# Patient Record
Sex: Female | Born: 1940
Health system: Southern US, Community
[De-identification: ages and names within clinical notes are randomized; demographics above are authoritative.]

## PROBLEM LIST (undated history)

## (undated) DIAGNOSIS — I10 Essential (primary) hypertension: Secondary | ICD-10-CM

## (undated) DIAGNOSIS — C541 Malignant neoplasm of endometrium: Secondary | ICD-10-CM

## (undated) DIAGNOSIS — R079 Chest pain, unspecified: Secondary | ICD-10-CM

## (undated) DIAGNOSIS — K219 Gastro-esophageal reflux disease without esophagitis: Secondary | ICD-10-CM

## (undated) DIAGNOSIS — E785 Hyperlipidemia, unspecified: Secondary | ICD-10-CM

## (undated) DIAGNOSIS — I739 Peripheral vascular disease, unspecified: Secondary | ICD-10-CM

## (undated) HISTORY — PX: CARPAL TUNNEL RELEASE: SHX101

## (undated) HISTORY — DX: Chest pain, unspecified: R07.9

## (undated) HISTORY — DX: Hyperlipidemia, unspecified: E78.5

## (undated) HISTORY — DX: Gastro-esophageal reflux disease without esophagitis: K21.9

## (undated) HISTORY — DX: Essential (primary) hypertension: I10

## (undated) HISTORY — DX: Malignant neoplasm of endometrium: C54.1

## (undated) HISTORY — DX: Peripheral vascular disease, unspecified: I73.9

## (undated) HISTORY — PX: BLADDER SUSPENSION: SHX72

---

## 1997-11-20 ENCOUNTER — Ambulatory Visit (HOSPITAL_COMMUNITY): Admission: RE | Admit: 1997-11-20 | Discharge: 1997-11-20 | Payer: Self-pay | Admitting: Family Medicine

## 1998-02-18 ENCOUNTER — Ambulatory Visit (HOSPITAL_COMMUNITY): Admission: RE | Admit: 1998-02-18 | Discharge: 1998-02-18 | Payer: Self-pay | Admitting: Obstetrics and Gynecology

## 1998-11-26 ENCOUNTER — Ambulatory Visit (HOSPITAL_COMMUNITY): Admission: RE | Admit: 1998-11-26 | Discharge: 1998-11-26 | Payer: Self-pay | Admitting: Specialist

## 1999-03-11 ENCOUNTER — Encounter: Payer: Self-pay | Admitting: Obstetrics and Gynecology

## 1999-03-11 ENCOUNTER — Ambulatory Visit (HOSPITAL_COMMUNITY): Admission: RE | Admit: 1999-03-11 | Discharge: 1999-03-11 | Payer: Self-pay | Admitting: Obstetrics and Gynecology

## 2000-10-13 ENCOUNTER — Ambulatory Visit (HOSPITAL_COMMUNITY): Admission: RE | Admit: 2000-10-13 | Discharge: 2000-10-13 | Payer: Self-pay | Admitting: Obstetrics and Gynecology

## 2000-10-13 ENCOUNTER — Encounter: Payer: Self-pay | Admitting: Obstetrics and Gynecology

## 2006-11-01 DIAGNOSIS — E8941 Symptomatic postprocedural ovarian failure: Secondary | ICD-10-CM | POA: Insufficient documentation

## 2006-11-11 ENCOUNTER — Encounter (INDEPENDENT_AMBULATORY_CARE_PROVIDER_SITE_OTHER): Payer: Self-pay | Admitting: Specialist

## 2006-11-11 ENCOUNTER — Ambulatory Visit (HOSPITAL_COMMUNITY): Admission: RE | Admit: 2006-11-11 | Discharge: 2006-11-11 | Payer: Self-pay | Admitting: Obstetrics and Gynecology

## 2006-12-01 HISTORY — PX: LAPAROSCOPIC ASSISTED VAGINAL HYSTERECTOMY: SHX5398

## 2006-12-29 ENCOUNTER — Encounter (INDEPENDENT_AMBULATORY_CARE_PROVIDER_SITE_OTHER): Payer: Self-pay | Admitting: Obstetrics and Gynecology

## 2006-12-29 ENCOUNTER — Inpatient Hospital Stay (HOSPITAL_COMMUNITY): Admission: RE | Admit: 2006-12-29 | Discharge: 2007-01-01 | Payer: Self-pay | Admitting: Obstetrics and Gynecology

## 2007-01-24 ENCOUNTER — Ambulatory Visit: Admission: RE | Admit: 2007-01-24 | Discharge: 2007-01-24 | Payer: Self-pay | Admitting: Gynecologic Oncology

## 2007-03-03 DIAGNOSIS — K224 Dyskinesia of esophagus: Secondary | ICD-10-CM | POA: Insufficient documentation

## 2007-04-04 ENCOUNTER — Ambulatory Visit: Admission: RE | Admit: 2007-04-04 | Discharge: 2007-04-04 | Payer: Self-pay | Admitting: Gynecologic Oncology

## 2007-04-04 ENCOUNTER — Other Ambulatory Visit: Admission: RE | Admit: 2007-04-04 | Discharge: 2007-04-04 | Payer: Self-pay | Admitting: Gynecologic Oncology

## 2007-04-04 ENCOUNTER — Encounter (INDEPENDENT_AMBULATORY_CARE_PROVIDER_SITE_OTHER): Payer: Self-pay | Admitting: Gynecologic Oncology

## 2007-05-05 ENCOUNTER — Ambulatory Visit: Payer: Self-pay | Admitting: Internal Medicine

## 2007-05-08 ENCOUNTER — Ambulatory Visit: Payer: Self-pay | Admitting: Internal Medicine

## 2007-06-19 ENCOUNTER — Ambulatory Visit: Payer: Self-pay | Admitting: Internal Medicine

## 2007-09-15 DIAGNOSIS — I10 Essential (primary) hypertension: Secondary | ICD-10-CM | POA: Insufficient documentation

## 2007-09-15 DIAGNOSIS — Z9889 Other specified postprocedural states: Secondary | ICD-10-CM | POA: Insufficient documentation

## 2007-09-15 DIAGNOSIS — Z9089 Acquired absence of other organs: Secondary | ICD-10-CM | POA: Insufficient documentation

## 2007-10-10 ENCOUNTER — Encounter: Payer: Self-pay | Admitting: Gynecology

## 2007-10-10 ENCOUNTER — Ambulatory Visit: Admission: RE | Admit: 2007-10-10 | Discharge: 2007-10-10 | Payer: Self-pay | Admitting: Gynecologic Oncology

## 2007-10-10 ENCOUNTER — Other Ambulatory Visit: Admission: RE | Admit: 2007-10-10 | Discharge: 2007-10-10 | Payer: Self-pay | Admitting: Gynecologic Oncology

## 2008-06-25 ENCOUNTER — Ambulatory Visit (HOSPITAL_COMMUNITY): Admission: RE | Admit: 2008-06-25 | Discharge: 2008-06-26 | Payer: Self-pay | Admitting: Obstetrics and Gynecology

## 2008-10-11 ENCOUNTER — Encounter (INDEPENDENT_AMBULATORY_CARE_PROVIDER_SITE_OTHER): Payer: Self-pay | Admitting: Urology

## 2008-10-11 ENCOUNTER — Ambulatory Visit (HOSPITAL_BASED_OUTPATIENT_CLINIC_OR_DEPARTMENT_OTHER): Admission: RE | Admit: 2008-10-11 | Discharge: 2008-10-11 | Payer: Self-pay | Admitting: Urology

## 2008-10-30 ENCOUNTER — Other Ambulatory Visit: Admission: RE | Admit: 2008-10-30 | Discharge: 2008-10-30 | Payer: Self-pay | Admitting: Gynecologic Oncology

## 2008-10-30 ENCOUNTER — Encounter (INDEPENDENT_AMBULATORY_CARE_PROVIDER_SITE_OTHER): Payer: Self-pay | Admitting: Gynecologic Oncology

## 2008-10-30 ENCOUNTER — Ambulatory Visit: Admission: RE | Admit: 2008-10-30 | Discharge: 2008-10-30 | Payer: Self-pay | Admitting: Gynecologic Oncology

## 2009-11-06 ENCOUNTER — Other Ambulatory Visit: Admission: RE | Admit: 2009-11-06 | Discharge: 2009-11-06 | Payer: Self-pay | Admitting: Gynecologic Oncology

## 2009-11-06 ENCOUNTER — Ambulatory Visit: Admission: RE | Admit: 2009-11-06 | Discharge: 2009-11-06 | Payer: Self-pay | Admitting: Gynecologic Oncology

## 2010-11-12 ENCOUNTER — Other Ambulatory Visit: Payer: Self-pay | Admitting: Gynecologic Oncology

## 2010-11-12 ENCOUNTER — Ambulatory Visit: Payer: Medicare Other | Attending: Gynecologic Oncology | Admitting: Gynecologic Oncology

## 2010-11-12 ENCOUNTER — Other Ambulatory Visit (HOSPITAL_COMMUNITY)
Admission: RE | Admit: 2010-11-12 | Discharge: 2010-11-12 | Disposition: A | Discharge status: Home / Self Care | Payer: Medicare Other | Source: Ambulatory Visit | Attending: Gynecologic Oncology | Admitting: Gynecologic Oncology

## 2010-11-12 DIAGNOSIS — K219 Gastro-esophageal reflux disease without esophagitis: Secondary | ICD-10-CM | POA: Insufficient documentation

## 2010-11-12 DIAGNOSIS — Z8540 Personal history of malignant neoplasm of unspecified female genital organ: Secondary | ICD-10-CM | POA: Insufficient documentation

## 2010-11-12 DIAGNOSIS — Z90710 Acquired absence of both cervix and uterus: Secondary | ICD-10-CM | POA: Insufficient documentation

## 2010-11-12 DIAGNOSIS — Z9079 Acquired absence of other genital organ(s): Secondary | ICD-10-CM | POA: Insufficient documentation

## 2010-11-12 DIAGNOSIS — I10 Essential (primary) hypertension: Secondary | ICD-10-CM | POA: Insufficient documentation

## 2010-11-12 DIAGNOSIS — C549 Malignant neoplasm of corpus uteri, unspecified: Secondary | ICD-10-CM | POA: Insufficient documentation

## 2010-11-12 LAB — POCT I-STAT 4, (NA,K, GLUC, HGB,HCT)
Glucose, Bld: 104 mg/dL — ABNORMAL HIGH (ref 70–99)
HCT: 42 % (ref 36.0–46.0)
Hemoglobin: 14.3 g/dL (ref 12.0–15.0)
Potassium: 4 mEq/L (ref 3.5–5.1)
Sodium: 138 mEq/L (ref 135–145)

## 2010-11-16 NOTE — Consult Note (Signed)
  NAMEREBBIE, LAURICELLA                 ACCOUNT NO.:  [id number]  MEDICAL RECORD NO.:  [id number]            PATIENT TYPE:  LOCATION:                                 FACILITY:  PHYSICIAN:  Laurette Schimke, MD     DATE OF BIRTH:  1941/06/09  DATE OF CONSULTATION:  11/12/2010 DATE OF DISCHARGE:                                CONSULTATION   REASON FOR VISIT:  Endometrial cancer surveillance.  HISTORY OF PRESENT ILLNESS:  This is a 70 year old who was evaluated by Dr. Tenny Craw and noted to have a grade 1 endometrial adenocarcinoma.  In May 2008 she underwent a laparoscopic-assisted vaginal hysterectomy, bilateral salpingo-oophorectomy and anterior colporrhaphy.  Her final pathology was notable for a grade 1 endometrioid adenocarcinoma with superficial myometrial invasion without lymphovascular space invasion. In 2009, she underwent a sling for urinary incontinence, which is markedly improved.  PAST MEDICAL HISTORY:  Hypertension, gastroesophageal reflux disease, stage I endometrial adenocarcinoma.  PAST SURGICAL HISTORY: 1. Prior carpal tunnel release. 2. Laparoscopic-assisted vaginal hysterectomy, bilateral salpingo-     oophorectomy with anterior colporrhaphy. 3. Sling for urinary incontinence.  FAMILY HISTORY:  No interval changes.  REVIEW OF SYSTEMS:  Notable for vulvar discomfort over the past few months managed by Dr. Tenny Craw with clobetasol.  She reports masses on the labia majora.  There is no cough, headache, abdominal pain, weight loss. She denies hematuria, hematochezia, or vaginal bleeding.  There is no unusual edema of the lower extremities.  Otherwise 10-point review of systems is noncontributory.  PHYSICAL EXAMINATION:  GENERAL:  A well-developed female in no acute distress. VITAL SIGNS:  Weight 211 pounds, blood pressure 142/78. CHEST:  Clear to auscultation. HEART:  Regular rate and rhythm. BACK:  No CVA tenderness. LYMPH NODE SURVEY:  No cervical, supraclavicular or  inguinal adenopathy. ABDOMEN:  Soft, obese.  Laparoscopic port sites without evidence of hernia or tenderness. PELVIC EXAMINATION:  External genitalia notable for sebaceous cysts, 2 on the right labia majora and 3 on the contralateral side.  Sebum was expressed from one of the right labial lesions.  This skin is erythematous.  No plaques are appreciated.  There are no unusual pigmentary changes.  Vagina is atrophic without any lesions or masses. RECTAL EXAMINATION:  Good anal sphincter tone without any cul-de-sac masses or rectovaginal septum nodularity. EXTREMITIES:  1+ edema in the lower extremities bilaterally.  IMPRESSION:  Ms. Lazard remains without any evidence of disease from her stage IA endometrial adenocarcinoma.  RECOMMENDATIONS:  A Pap test was collected today.  She has been advised to follow up with Dr. Tenny Craw in 6 months and follow up with Gynecology/Oncology in 12 months.  She has been advised that she can use a warm cloth at the area of the sebaceous cysts and express the sebum with clean fingers.     Laurette Schimke, MD     WB/MEDQ  D:  11/12/2010  T:  11/12/2010  Job:  [id number]  cc:   Miguel Aschoff, M.D.  Telford Nab, R.N. [address] [place], [place] [address]  Electronically Signed by Laurette Schimke MD on 11/16/2010 11:27:52 AM

## 2010-12-15 NOTE — Consult Note (Signed)
Donna Sloan, Donna Sloan                 ACCOUNT NO.:  [id number]   MEDICAL RECORD NO.:  [id number]          PATIENT TYPE:  OUT   LOCATION:  GYN                          FACILITY:  [facility]   PHYSICIAN:  John T. Kyla Balzarine, M.D.    DATE OF BIRTH:  1941-03-05   DATE OF CONSULTATION:  DATE OF DISCHARGE:                                 CONSULTATION   CHIEF COMPLAINT:  Follow-up of endometrial cancer.   HISTORY OF PRESENT ILLNESS:  This patient had complex hyperplasia with  atypia on a D and C performed for menopausal bleeding.  In  May 2008 she  underwent laparoscopic-assisted vaginal hysterectomy with BSO and  anterior colporrhaphy.  Final pathology revealed a grade 1 endometrioid  adenocarcinoma with superficial myometrial invasion (2009 stage IA,  grade 1 lesion), there was no LVSI and postoperative CT scan was  negative.  She has been followed without evidence of recurrent disease  with last follow-up examination and cytology negative.  In November she  underwent sling procedure for urinary incontinence which has markedly  improved incontinence.  She denies pelvic pain, vaginal bleeding, change  in bowel or bladder function or leg swelling.   PAST MEDICAL HISTORY:  1. Hypertension.  2. GERD.  3. Prior carpal tunnel release.  4. LAVH/BSO.  5. Sling procedure.  6. NSVD x2.   CURRENT MEDICATIONS:  Antihypertensive and Nexium.   ALLERGIES:  PENICILLIN.   FAMILY HISTORY:  Mother with basal cell cancers but no gynecologic or  breast malignancies.   PERSONAL AND SOCIAL HISTORY:  Married and denies tobacco or ethanol.   REVIEW OF SYSTEMS:  A 10-point comprehensive review negative except as  noted above.   EXAM:  Weight 204 pounds, blood pressure 130/78.  The patient is alert  and oriented x3, in no acute distress.  LYMPH:  Survey negative for pathologic lymphadenopathy.  BACK:  No spinous or CVA tenderness.  ABDOMEN:  Soft and benign with no hernia, ascites, tenderness or mass.  EXTREMITIES:  Full strength and range of motion without edema, cords or  Homan's.  PELVIC:  External genitalia and BUS, bladder and urethra normal.  The  vagina is well supported with no granulation tissue or other vaginal  lesions.  Bimanual and rectovaginal examinations disclose absent uterus  and cervix, no mass or nodularity and no tenderness.   ASSESSMENT:  Endometrial carcinoma, no evidence of disease.   PLAN:  Cytology obtained and we can continue to alternate follow-up with  Dr. Tenny Craw at 79-month intervals, such that we would see her back for  follow-up in 1 year.      John T. Kyla Balzarine, M.D.  Electronically Signed     JTS/MEDQ  D:  10/30/2008  T:  10/30/2008  Job:  [id number]   cc:   Miguel Aschoff, M.D.  Fax: [phone]   Telford Nab, R.N.  [address]  [place], [place] [address]

## 2010-12-15 NOTE — Consult Note (Signed)
Donna Sloan, Donna Sloan                 ACCOUNT NO.:  [id number]   MEDICAL RECORD NO.:  [id number]          PATIENT TYPE:  OUT   LOCATION:  GYN                          FACILITY:  [facility]   PHYSICIAN:  De Blanch, M.D.DATE OF BIRTH:  May 01, 1941   DATE OF CONSULTATION:  10/10/2007  DATE OF DISCHARGE:                                 CONSULTATION   CHIEF COMPLAINT:  Endometrial cancer.   INTERVAL HISTORY:  Since her last visit, the patient has had no interval  problems.  Overall she is doing quite well.  She denies any GI or GU  symptoms, has no pelvic pain, pressure, vaginal bleeding or discharge.  Her functional status is excellent.  She spent a good bit of the winter  in [place] and in [place].   HISTORY OF PRESENT ILLNESS:  Laparoscopically assisted vaginal surgery,  bilateral salpingo-oophorectomy and anterior colporrhaphy in May 2008.  Final pathology showed a grade 1 endometrial carcinoma with complex  hyperplasia and minimal superficial invasion of the myometrium (4 mm).   PAST MEDICAL HISTORY:  Medical illnesses:  Hypertension,  gastroesophageal reflux disease.   PAST SURGICAL HISTORY:  Carpal tunnel release, D&C, LAVH-BSO.   OBSTETRICAL HISTORY:  Gravida 2.   CURRENT MEDICATIONS:  Antihypertensive and Nexium.   DRUG ALLERGIES:  PENICILLIN.   FAMILY HISTORY:  Mother with basal cell cancers.   PERSONAL AND SOCIAL HISTORY:  The patient is married.  She does not  smoke.   REVIEW OF SYSTEMS:  Ten-point comprehensive review of systems negative  except as noted above.   PHYSICAL EXAMINATION:  VITAL SIGNS:  Weight 196 pounds, blood pressure  130/70, pulse 80, respiratory rate 20.  GENERAL:  The patient is a healthy white female in no acute distress.  HEENT:  Negative.  NECK:  Supple without thyromegaly.  There is no supraclavicular or  inguinal adenopathy.  ABDOMEN:  Soft, nontender.  No mass, organomegaly, ascites or hernias  are noted.  PELVIC:  EG, BUS,  vagina, and urethra are normal.  Cervix and uterus are  surgically absent.  Vaginal cuff is well-healed, well-supported.  No  lesions are noted.  Bimanual and rectovaginal exam reveal no masses,  induration or nodularity.  EXTREMITIES:  Lower extremities without edema or varicosities.   IMPRESSION:  Stage Ib grade 1 endometrial carcinoma.   The patient will return to see Dr. Miguel Aschoff in 6 months or return to  see Dr. Kyla Balzarine in 1 year.  Pap smear is obtained today.      De Blanch, M.D.  Electronically Signed     DC/MEDQ  D:  10/10/2007  T:  10/11/2007  Job:  [id number]   cc:   Telford Nab, R.N.  [address]  [place], [place] [address]   Miguel Aschoff, M.D.  Fax: [phone]

## 2010-12-15 NOTE — Assessment & Plan Note (Signed)
[facility]                         GASTROENTEROLOGY OFFICE NOTE   NAME:Donna Sloan, Donna Sloan                      MRN:          [id number]  DATE:06/19/2007                            DOB:          [birth year]    Donna Sloan is a very nice, 70 year old, white female who was evaluated  for dysphagia by upper endoscopy on May 08, 2007 and was found to  have an essentially normal exam. We passed a large Maloney dilator  through her esophagus and she has had complete relief of the dysphagia  and she has not had any complaint since the dilatation. I suspect that  she might have had mild esophageal stricture which was not  endoscopically noticeable. She denies any symptoms of gastroesophageal  reflux and really has not had to take her Pepcid at all.   The patient also had a screening colonoscopy at the same time which  showed a normal exam from the rectum to the cecum, there were no polyps  and her recall interval is 10 years. The patient is currently satisfied  with her condition. We will see her on a p.r.n. basis only.     Hedwig Morton. Juanda Chance, MD  Electronically Signed    DMB/MedQ  DD: 06/19/2007  DT: 06/19/2007  Job #: [id number]   cc:   Rema Fendt

## 2010-12-15 NOTE — Consult Note (Signed)
NAMEARZU, Donna Sloan                 ACCOUNT NO.:  [id number]   MEDICAL RECORD NO.:  [id number]          PATIENT TYPE:  OUT   LOCATION:  GYN                          FACILITY:  [facility]   PHYSICIAN:  John T. Kyla Balzarine, M.D.    DATE OF BIRTH:  1941/02/02   DATE OF CONSULTATION:  04/04/2007  DATE OF DISCHARGE:                                 CONSULTATION   CHIEF COMPLAINT:  Follow up of endometrial carcinoma.   HISTORY OF PRESENT ILLNESS:  The patient had complex hyperplasia with  atypia on a D&C with cervical LEEP biopsy.  On May 29, she underwent  laparoscopic-assisted vaginal hysterectomy with BSO and anterior  colporrhaphy.  Final pathology revealed a grade 1 endometrioid  adenocarcinoma associated with complex atypical hyperplasia.  There was  focal superficial myometrial invasion (0.4 out of 1.2 cm).  There is no  LVSI with negative cervix and adnexa.  Postoperative CT scan was  negative.   PAST MEDICAL HISTORY:  1. Hypertension.  2. GERD.  3. NSVD x2.  4. Carpal tunnel release.  5. D&C.  6. LAVH/BSO.   MEDICATIONS:  Unknown antihypertensive and Nexium.   ALLERGIES:  PENICILLIN.   FAMILY HISTORY:  The patient's mother had multiple basal cell cancers  but no known family history of breast or gynecologic malignancy.   PERSONAL AND SOCIAL HISTORY:  Married and denies tobacco or ethanol use.   REVIEW OF SYSTEMS:  Negative 10-point review other than above.  The  patient has normal bowel and bladder functions.  She denies pelvic pain,  vaginal bleeding or leg swelling.   PHYSICAL EXAMINATION:  GENERAL:  Weight 190 pounds.  The patient is  anxious, alert and oriented x3, in no acute distress.  LYMPH SURVEY:  No pathologic lymphadenopathy.  BACK:  No spinous or CVA tenderness.  ABDOMEN:  Soft and benign with well-healed trocar site incisions.  No  inflammation, hernia, mass, or ascites.  No abdominal tenderness.  EXTREMITIES:  Full strength and range of motion without edema.  PELVIC:  External genitalia and BUS normal to inspection and palpation.  The bladder and urethra are negative and the vagina is clear and  somewhat atrophic.  Bimanual and rectovaginal examinations reveal absent  uterus and cervix with no mass or nodularity and no tenderness.   ASSESSMENT:  Endometrial cancer, NAD.   PLAN:  The patient will see Dr. Tenny Craw for follow-up in 3 months and  return to see [place] in 6 months.  Cytology is obtained today and will be  communicated to the patient.      John T. Kyla Balzarine, M.D.  Electronically Signed     JTS/MEDQ  D:  04/04/2007  T:  04/05/2007  Job:  [id number]   cc:   Miguel Aschoff, M.D.  Fax: [phone]   Telford Nab, R.N.  [address]  [place], [place] [address]

## 2010-12-15 NOTE — Op Note (Signed)
Donna Sloan, Donna Sloan                 ACCOUNT NO.:  [id number]   MEDICAL RECORD NO.:  [id number]          PATIENT TYPE:  OIB   LOCATION:  9310                          FACILITY:  [facility]   PHYSICIAN:  Randye Lobo, M.D.   DATE OF BIRTH:  1940-11-19   DATE OF PROCEDURE:  06/25/2008  DATE OF DISCHARGE:                               OPERATIVE REPORT   PREOPERATIVE DIAGNOSIS:  Genuine stress incontinence.   POSTOPERATIVE DIAGNOSIS:  Genuine stress incontinence.   PROCEDURES:  Lynx midurethral sling, cystoscopy, and anterior  colporrhaphy.   SURGEON:  Randye Lobo, MD.   ASSISTANT:  Miguel Aschoff, MD.   ANESTHESIA:  General, local with 0.5% lidocaine with epinephrine  1:200,000.   IV FLUIDS:  1300 mL of Ringer lactate.   ESTIMATED BLOOD LOSS:  Minimal.   URINE OUTPUT:  Quantity sufficient.   COMPLICATIONS:  None.   INDICATIONS FOR THE PROCEDURE:  The patient is a 70 year old gravida 2,  para 2 Caucasian female, status post laparoscopically-assisted vaginal  hysterectomy with bilateral salpingo-oophorectomy and anterior  colporrhaphy in Dec 29, 2006, with the final pathology report of  adenocarcinoma of the endometrium, who presented reporting urinary  incontinence with sneezing, coughing, and laughing.  The patient has not  had any radiation therapy for her endometrial cancer.  The patient did  undergo multichannel urodynamic testing in the office on May 06, 2008, which documented genuine stress incontinence with a leak point  pressure of 164 cm of water.  The patient has been seen by her  oncologist, Dr. Ronita Hipps, and her primary gynecologist, Dr. Miguel Aschoff, and has had regular checkups and repeat Pap smears and she is  disease free.  The patient therefore chooses to proceed with surgical  treatment of her urinary incontinence and a plan is made to proceed with  a midurethral sling and cystoscopy after risks, benefits, and  alternatives are reviewed.   FINDINGS:   Examination under anesthesia actually revealed a first-degree  cystocele.  The vaginal apex was well supported.  The cervix was absent.   Cystoscopy demonstrated the ureters to be patent bilaterally after the  injection of indigo carmine dye IV.  The bladder was visualized  throughout 360 degrees and at the bladder dome right in the midline,  there was a 3-4 mm vesicular, purple, well-defined submucosal area.  There were no other areas as such in the bladder.  A picture is taken.  There was no evidence of a foreign body in the bladder or the urethra  during placement of the sling.   SPECIMENS:  None.   PROCEDURE:  The patient was reidentified in the preoperative hold area.  She did receive ciprofloxacin 400 mg IV for antibiotic prophylaxis.  The  patient received both TED hose and PAS stockings for deep venous  thrombosis prophylaxis.   The patient was taken to the operating room where general endotracheal  anesthesia was induced.  The patient was then placed in the dorsal  lithotomy position.  The lower abdomen and vagina were sterilely prepped  and draped.  A Foley catheter  was placed inside the bladder.  An exam  under anesthesia was performed.   The procedure began by marking the midline of the anterior vaginal wall  with Allis clamps along the length of the cystocele.  The mucosa was  then injected with 0.5% lidocaine with 1:200,000 of epinephrine.  The  vaginal mucosa was then incised sharply with a scalpel in the midline.  The Metzenbaum scissors was then used to dissect the subvaginal tissue  and bladder off of the overlying vaginal mucosa bilaterally.  Blunt  dissection was also used to reach the level of the pubic rami  bilaterally.   Suprapubic incisions, 1-cm were then created, 2.5 cm to the right and  left to the midline.  The needle was passed in a top-down fashion  through the retropubic space.  The abdominal needle passer was placed  through the right suprapubic  incision and out through the endovaginal  fascia on the ipsilateral side.  The same procedure that was performed  on the right-hand side was repeated on the left-hand side.  The Foley  catheter was removed at this time and cystoscopy was performed after the  injection of indigo carmine dye IV.  The findings are as noted above.  The cystoscope was then withdrawn and the Foley catheter was replaced  and the bladder was completely emptied.   The sling was attached to the abdominal needle passers and was drawn up  through the suprapubic incisions.  The midline tab was then cut and  final positioning on the sling was performed.  A Kelly clamp was placed  between the sling and the urethra as the plastic sheaths were removed.  The sling was noted to be in good position.   A small anterior colporrhaphy was performed with vertical mattress  sutures of 2-0 Vicryl for reduction of the cystocele.  A small amount of  vaginal mucosa was trimmed bilaterally and the anterior vaginal wall was  then closed with a running locked suture of 2-0 Vicryl.  A plain gauze  packing was placed inside the vagina.   The suprapubic incisions were closed with Dermabond.  The excess sling  had been previously trimmed suprapubically.   That concluded the patient's procedure.  There were no complications.  All needle, instrument, and sponge counts were correct.      Randye Lobo, M.D.  Electronically Signed     BES/MEDQ  D:  06/25/2008  T:  06/26/2008  Job:  [id number]

## 2010-12-15 NOTE — Assessment & Plan Note (Signed)
[facility]                         GASTROENTEROLOGY OFFICE NOTE   NAME:Donna Sloan, Donna Sloan                      MRN:          [id number]  DATE:05/05/2007                            DOB:          [birth year]    Donna Sloan is a very nice, 70 year old patient of Dr. Renette Butters, who is here  to evaluate two issues.  One is solid food dysphagia and one is  colorectal screening.  As far as the solid-food dysphagia is concerned,  Donna Sloan has noted hesitation of the food in the last 6-12 months.  She  denies any dysphagia to liquids.  She has not had any food impaction,  but she has to be very careful when she chews.  Aspirin and NSAIDs burn  her substernally and she has avoided them for years.  Her voice gets  hoarse occasionally and she coughs at night and has a scratchy throat at  night, but not during the day.  She has never had any endoscopy,  dilatation or barium swallow.  As far as her lower GI symptoms are  concerned, she has regular bowel habits with occasional diarrhea.  She  never takes any laxatives.  There has been no rectal bleeding.  There is  no family history of colon cancer.  She is aware of external hemorrhoid  which seems to be irritated occasionally.   MEDICATIONS:  1. Lisinopril/HCTZ 20/12.5 mg p.o. daily.  2. Minocycline p.r.n. rosacea.   PAST HISTORY:  Significant for high blood pressure, depression, uterine  cancer.  Operations:  Laparoscopically-assisted vaginal hysterectomy and  bilateral salpingo-oophorectomy in April 2008 for endometrial cancer.  Right carpal tunnel.  Tonsillectomy.   FAMILY HISTORY:  Positive for heart disease in mother, father and  uncles.   SOCIAL HISTORY:  Married with two children.  She is retired from  [employer].  She does not smoke, does not drink alcohol.   REVIEW OF SYSTEMS:  Positive for anxiety, leakage of urine, annual  depression.   PHYSICAL EXAM:  Blood pressure 112/66, pulse 80 and weight 192  pounds.  She was alert and oriented, in no distress.  She was clearly overweight.  LUNGS:  Clear to auscultation.  COR:  With normal S1, normal S2.  ABDOMEN:  Soft, mildly protuberant and nontender with normal liver edge  at costal margin, no surgical scars.  RECTAL EXAM:  Soft, Hemoccult-negative stool.  EXTREMITIES:  No edema.   IMPRESSION:  1. Patient is a 70 year old white female with solid food dysphagia,      suggestive of esophageal stricture.  Her symptoms have progressed      in the last 6-12 months and are consistent with, most likely,      benign esophageal stricture, due to gastroesophageal reflux.  We      need to rule out Barrett's esophagus, hiatal hernia.  2. Patient is in need of colorectal screening.  She has a personal      history of endometrial cancer, but no colon cancer in the family.      She has never had a colon exam.   PLAN:  1. Upper  endoscopy with possible dilatation.  2. Colonoscopy scheduled.  3. I have discussed the prep, procedure, as well as the conscious      sedation, with the patient.  4. We will also put her on Pepcid 40 mg daily.  She did try Nexium in      the past, but it was too strong for her, according to the patient,      so we will start with H2 receptor antagonist, because she is more      likely to be able to tolerate it.     Donna Sloan. Donna Chance, MD  Electronically Signed    DMB/MedQ  DD: 05/05/2007  DT: 05/05/2007  Job #: [id number]   cc:   [facility] Dr. Rema Fendt

## 2010-12-15 NOTE — Op Note (Signed)
NAMEDAMARIS, ABELN                 ACCOUNT NO.:  [id number]   MEDICAL RECORD NO.:  [id number]          PATIENT TYPE:  INP   LOCATION:  9399                          FACILITY:  [facility]   PHYSICIAN:  Miguel Aschoff, M.D.       DATE OF BIRTH:  May 09, 1941   DATE OF PROCEDURE:  12/29/2006  DATE OF DISCHARGE:                               OPERATIVE REPORT   PREOPERATIVE DIAGNOSES:  1. Atypical adenomatous hyperplasia of endometrium.  2. Symptomatic cystocele with urinary stress incontinence.   OPERATIONS AND PROCEDURES:  1. Laparoscopically-assisted vaginal hysterectomy and bilateral      salpingo-oophorectomy.  2. Anterior colporrhaphy.   SURGEON:  Miguel Aschoff, MD   ASSISTANT:  Luvenia Redden, MD   ANESTHESIA:  General.   COMPLICATIONS:  None.   JUSTIFICATION:  The patient is a 70 year old white female who has had a  history of irregular vaginal bleeding and underwent hysteroscopy and D&C  in April 2008.  The findings at this procedure revealed copious amounts  of endometrial curettings and the pathology report returned with  atypical adenomatous hyperplasia with no evidence of carcinoma of the  endometrium.  Because of these findings and the potential for  development of malignancy, it was suggested that the patient undergo  hysterectomy and she has given informed consent for a laparoscopically-  assisted vaginal hysterectomy and bilateral salpingo-oophorectomy.  In  addition, she reports noticing pelvic pressure and loss of urine at  times and requests that if possible a repair be done into the bladder to  try to improve this stress incontinence.  Informed consent has been  obtained for a laparoscopically-assisted vaginal hysterectomy, bilateral  salpingo-oophorectomy and anterior colporrhaphy.   PROCEDURE:  The patient was taken to the operating room and placed in  supine position, and general anesthesia was administered without  difficulty.  She was then placed in the dorsal  lithotomy position,  prepped and draped in the usual sterile fashion.  After this was done  the bladder was catheterized and with the patient in dorsal lithotomy  position, she was prepped and draped in the usual sterile fashion.  Examination under anesthesia revealed normal external genitalia, normal  Bartholin's and Skene's glands.  There was a second-degree cystocele  present with mild urethrocele.  There was good posterior support.  Cervix was without gross lesion.  Uterus was normal size and shape.  The  adnexa revealed no masses.  A Hulka tenaculum was then placed through  the cervix and the cervix was held.  Attention was then directed to the  umbilicus, where a small infraumbilical incision was made, the Veress  needle was inserted, and then the abdomen was insufflated with 3 L of  CO2.  Following insufflation, the trocar to the laparoscope was placed  followed by the laparoscope itself.  To allow complete visualization,  accessory 5 mm ports were established in the right and left lower  quadrants under direct visualization.  Inspection in the abdomen  revealed the uterus to be normal size and shape.  The adnexa revealed  ovaries to be small consistent with the  patient's menopausal state.  No  lesions were otherwise noted in the cul-de-sac or in the pelvis.  At  that point using the Gyrus unit, the infundibulopelvic ligament on each  side was grasped and cauterized and then cut and then the dissection  continued medially along the meso-ovarian ligament again with serial  burns and cuts until the round ligament was found, cauterized and cut,  and then two additional bites were taken of the broad ligament  structures on either side with dissection now down to the level of the  uterine vessels.  At this point the laparoscopic portion of the  procedure was completed and attention was directed of the vaginal  portion of the operation.  A weighted speculum was placed in the vaginal   vault.  The cervix was grasped with a tenaculum.  The previously-placed  Hulka tenaculum was removed.  The cervix was then injected with 1%  Xylocaine with epinephrine to provide hemostasis and then the vaginal  mucosa was circumscribed and dissected anteriorly and posteriorly until  the peritoneal reflections were found.  The peritoneum was then entered  posteriorly.  At this point the uterosacral ligaments were grasped with  Heaney clamps.  These pedicles were cut and suture ligated using suture  ligatures of 0 Vicryl.  The cardinal ligaments were clamped in a similar  fashion, pedicles cut and suture ligated with ligatures of 0 Vicryl.  At  this point the paracervical fascia was clamped with curved Heaney  clamps, pedicles cut and suture ligated with suture ligatures of 0  Vicryl.  At this point the anterior peritoneum was entered.  Then the  additional structures of the broad ligament and the uterine vessels were  clamped with Heaney clamps.  These pedicles were cut and ligated using  suture ligatures of 0 Vicryl and then one additional bite was taken on  each side, freeing the specimen, the specimen consisting of the cervix,  uterus, tubes and ovaries.  These pedicles were again suture ligated and  then free tied.  At this point the posterior vaginal cuff was run using  running interlocking 0 Vicryl suture.  At this point inspection made for  hemostasis.  Hemostasis appeared be excellent, and then the posterior  peritoneum was closed using pursestring suture of 0 Vicryl.  Attention  was then directed to the anterior vaginal wall.  It was injected with 1%  Xylocaine with epinephrine and then in the midline the mucosa was  dissected out to approximately 2 cm of the urethral orifice.  The mucosa  was then dissected and the paravesical fascia dissected free off the  mucosa.  Then using Kelly plication suture 0 Vicryl, it was possible that the urethra to try to restore the posterior  urethrovesical angle.  At this point the cystocele was reduced using serial interrupted 2-0  Vicryl sutures.  At this point the excess vaginal mucosa was trimmed and  then the vaginal mucosa was reapproximated using running interlocking 0  Vicryl sutures.  The vaginal cuff was then closed by approximating the  uterosacral ligaments in the midline and then using interrupted 0 Vicryl  sutures, the cuff was closed.  Hemostasis at this point appeared to be  excellent.  An Iodoform pack was then placed in vagina to provide  additional hemostasis.  Attention was then directed back to the abdomen,  where the abdomen was reinsufflated.  Inspection was then carried out of  all pedicles and of the vaginal cuff.  Hemostasis again appeared to  be  excellent.  The pelvis then irrigated with saline and aspirated through  a Nezhat suction irrigator unit and this point with no other  abnormalities being noted and with good hemostasis, the procedure was  completed.  All instrument counts and lap counts were taken and found to  be correct.  The laparoscopic instruments were removed and the small  incisions were closed using subcuticular 4-0 Vicryl.  The estimated  blood loss from the procedure was approximately 150 mL.  The patient  tolerated the procedure well and went to the recovery room in  satisfactory condition.      Miguel Aschoff, M.D.  Electronically Signed     AR/MEDQ  D:  12/29/2006  T:  12/29/2006  Job:  [id number]

## 2010-12-15 NOTE — Consult Note (Signed)
Donna Sloan, Donna Sloan                 ACCOUNT NO.:  [id number]   MEDICAL RECORD NO.:  [id number]          PATIENT TYPE:  OUT   LOCATION:  GYN                          [facility]ACILITY:  [facility]   PHYSICIAN:  John T. Kyla Balzarine, M.D.    DATE OF BIRTH:  [birth year]   DATE OF CONSULTATION:  DATE OF DISCHARGE:                                 CONSULTATION   CHIEF COMPLAINT:  This 70 year old woman is seen at the request of Dr.  Miguel Aschoff for recommendations regarding management of endometrial  carcinoma.   HISTORY OF PRESENT ILLNESS:  This patient had menopausal bleeding and  evaluation by Dr. Tenny Craw included D&C with a cervical LEEP biopsy.  Final  pathology revealed a complex hyperplasia with atypia, with a comment  that focal well-differentiated endometrial carcinoma cannot be ruled  out.  On May 29 the patient underwent laparoscopic-assisted vaginal  hysterectomy with BSO and anterior colporrhaphy.  Final pathology  revealed well-differentiated endometrioid adenocarcinoma associated with  complex atypical hyperplasia.  Focal superficial myometrial invasion was  identified.  Depth of invasion was 0.4 out of 1.2 cm.  Myometrium with  no vascular lymphatic invasion, negative cervix and adnexa.  Unfortunately, peritoneal washings were not obtained.  The patient is  convalescing from surgery.  She tires easily and is normalizing activity  for ADLs.  Of note, she underwent a CT scan prior to being seen by Dr.  Tenny Craw which was apparently negative.   PAST MEDICAL HISTORY:  Significant for hypertension, GERD, NSVD x2,  carpal tunnel release, and D&C.   MEDICATIONS:  Unknown antihypertensive and Nexium.   ALLERGIES:  PENICILLIN.   FAMILY MEDICAL HISTORY:  The patient's mother had multiple basal cell  cancers but no known family history of breast or gynecologic malignancy.   PERSONAL AND SOCIAL HISTORY:  The patient is married and denies tobacco  or ethanol use.   REVIEW OF SYSTEMS:  Other than noted above,  negative 10-point review.   EXAMINATION:  Weight 192 pounds.  Vital signs otherwise stable and  afebrile.  The patient is anxious, alert and oriented x3, in no acute  distress.  Examination is deferred per patient request.   ASSESSMENT:  Apparent stage Ib grade 1 endometrial adenocarcinoma.   RECOMMENDATIONS:  I spent in excess of 45 minutes with the patient and  her husband and answered multiple questions posed by them as I discussed  the risks of occult metastasis, comparison of the patient's with similar  histology treated with adjuvant radiation versus observation, and  comprehensive restaging via laparoscopy.  We discussed risks and  benefits of restaging, but in reality her risk of pelvic node metastasis  and positive cytology would be on the order of 10%, with the overall  risk of recurrence approximately 15% in the absence of further treatment  for surgery.  The patient and her husband will consider these options  and will contact Telford Nab if  they wish to set up restaging surgery in the next few weeks.  Otherwise,  she should undergo examination with cytology initially at 64-month  intervals and subsequently 2-month intervals.  She could alternate  followup between Dr. Tenny Craw and myself.      John T. Kyla Balzarine, M.D.  Electronically Signed     JTS/MEDQ  D:  01/24/2007  T:  01/25/2007  Job:  [id number]   cc:   Miguel Aschoff, M.D.  Fax: [phone]   Telford Nab, R.N.  [address]  [place], [place] [address]

## 2010-12-15 NOTE — Op Note (Signed)
Donna Sloan, Donna Sloan                 ACCOUNT NO.:  [id number]   MEDICAL RECORD NO.:  [id number]          PATIENT TYPE:  AMB   LOCATION:  [facility]                         FACILITY:  [facility]   PHYSICIAN:  Sigmund I. Patsi Sears, M.D.DATE OF BIRTH:  March 03, 1941   DATE OF PROCEDURE:  10/11/2008  DATE OF DISCHARGE:                               OPERATIVE REPORT   PREOPERATIVE DIAGNOSES:  Bladder lesion, with history of carcinoma of  the uterus.   POSTOPERATIVE DIAGNOSES:  Bladder lesion, with history of carcinoma of  the uterus.   OPERATION:  Cystourethroscopy, cold cup bladder biopsy.   SURGEON:  Jethro Bolus, M.D.   ANESTHESIA:  General LMA.   PREPARATION:  After appropriate preanesthesia, the patient is brought to  the operating room, placed on the operating table in dorsal supine  position, where general LMA anesthesia was introduced.  She was replaced  in dorsal lithotomy position, where the pubis was prepped with Betadine  solution and draped in usual fashion.   REVIEW OF HISTORY:  [name] is a 70 year old female, found to have a  bladder lesion upon cystoscopy by Dr. Edward Jolly during anti-incontinence  surgery in November of 2009.  She is post hysterectomy and BSO in 2008  by Dr. Tenny Craw, found to have cancer of the uterus.  She is now for biopsy  to rule out recurrent uterine carcinoma.   PROCEDURE:  Cystourethroscopy was accomplished, and bladder lesion was  identified in the same location as found by Dr. Edward Jolly.  Cold cup bladder  biopsy is accomplished, and this completely excises the lesion.  The  Bugbee  electrode is then used to cauterize the area, no bleeding is noted.  The  patient was given IV Toradol, awakened, taken to recovery room in good  condition.  Signed 10/24/08 @ 4pm      Sigmund I. Patsi Sears, M.D.  Electronically Signed     SIT/MEDQ  D:  10/11/2008  T:  10/11/2008  Job:  [id number]   cc:   Randye Lobo, M.D.  Fax: [phone]   Jackquline Denmark. Kyla Balzarine, M.D.  [address]  [place]  [place] [address]   Miguel Aschoff, M.D.  Fax: [phone]

## 2010-12-18 NOTE — Op Note (Signed)
Donna Sloan, Donna Sloan                 ACCOUNT NO.:  [id number]   MEDICAL RECORD NO.:  [id number]          PATIENT TYPE:  AMB   LOCATION:  [facility]C                           [facility]ACILITY:  [facility]   PHYSICIAN:  Miguel Aschoff, M.D.       DATE OF BIRTH:  1941-05-31   DATE OF PROCEDURE:  11/11/2006  DATE OF DISCHARGE:                               OPERATIVE REPORT   PREOPERATIVE DIAGNOSES:  1. Postmenopausal bleeding.  2. Cervical intraepithelial neoplasia of the cervix.  3. Skin tag.   POSTOPERATIVE DIAGNOSES:  1. Postmenopausal bleeding.  2. Cervical intraepithelial neoplasia of the cervix.  3. Skin tag.   PROCEDURE:  Cervical dilatation, hysteroscopy, removal of endometrial  polyp, uterine curettage followed by LEEP procedure with endocervical  curettage and removal of skin tag.   SURGEON:  Dr. Miguel Aschoff.   ANESTHESIA:  General.   COMPLICATIONS:  None.   JUSTIFICATION:  The patient is a 70 year old, white female with a  history of postmenopausal bleeding now being brought to the operating  room to assess the etiology of this bleeding. In addition, she is noted  to have an abnormal Pap smear with CIN 1. Also the patient has requested  that at the time that the postmenopausal bleeding is evaluated she  requests that a skin tag on her left side be removed.  The risks and  benefits of the procedure were discussed with the patient and informed  consent has been obtained.   DESCRIPTION OF PROCEDURE:  The patient was taken to the operating room,  placed in a supine position and general anesthesia was administered  without difficulty.  She was then placed in the dorsal lithotomy  position and prepped and draped in the usual sterile fashion.  Examination revealed normal external genitalia, normal Bartholin's and  Skene's glands, normal urethra.  The vaginal vault showed a rectocele,  cystocele. There was mild uterine descensus.  The cervix did not have  any gross lesions. The  uterus was normal  size and shape. The adnexa  revealed no masses. At this point, a speculum was placed in the vaginal  vault.  The anterior cervical lip was grasped with the tenaculum and  then the endometrial canal was dilated using serial Pratt dilators until  a #25 Pratt dilator could be passed.  After this was done, the  diagnostic hysteroscope was placed through the endocervical canal, no  endocervical lesions were noted.  Inspection of the endometrial cavity  revealed the presence of a polyp in the left coronal region of the  uterine cavity.  No other lesions were noted. At this point, the  hysteroscope was removed, polyps forceps were introduced and the polyp  was removed without difficulty and sent for histologic study.  Following  this, sharp vigorous curettage was carried out using a medium-size  serrated curette. These curettings were sent as a separate specimen.  Once this was done, the cervix was then injected with 20 mL of 1%  Xylocaine with 1:200,000 epinephrine for hemostasis.  Then using a large  LEEP electrode and Lugol's solution, the cervix was  stained and an area  of poor Lugol's uptake was noted on the posterior cervical lip from the  5 o'clock to 8 o'clock positions.  The anterior cervical lip took up  Lugol's solution diffusely. With a large loop, the lesion was excised  and sent for histologic study.  The residual portion of the cervix was  then curetted and the endocervical curettings were sent also as a  specimen. Once was this was done, the biopsy site was cauterized with a  ball electrode without difficulty and the remainder of the lesion on the  posterior cervix was also cauterized with electrocautery. Hemostasis was  readily achieved and the lesion was ablated. With this portion of the  procedure completed, attention was directed to the skin tag. It was  elevated, the base was grasped and crushed and then the skin tag was  excised without difficulty. This did not require any  suturing. This was  also sent as a specimen.  At this point, the procedure was completed.  The estimated blood loss was approximately 30 mL.  The patient was  reversed from the anesthetic and taken to the recovery room in  satisfactory condition.   The plan is for the patient to be discharged home.  Medications for home  include Darvocet-N 100 one every 4 hours as needed for pain.  She is to  continue taking her other medication. The patient is to call for a  pathology report on Wednesday. She is to call if there are any problems  such as fever, pain or heavy bleeding.      Miguel Aschoff, M.D.  Electronically Signed     AR/MEDQ  D:  11/11/2006  T:  11/11/2006  Job:  [id number]

## 2010-12-18 NOTE — Discharge Summary (Signed)
Donna Sloan, Donna Sloan                 ACCOUNT NO.:  [id number]   MEDICAL RECORD NO.:  [id number]          PATIENT TYPE:  INP   LOCATION:  9303                          FACILITY:  [facility]   PHYSICIAN:  Miguel Aschoff, M.D.       DATE OF BIRTH:  1940/08/19   DATE OF ADMISSION:  12/29/2006  DATE OF DISCHARGE:  01/01/2007                               DISCHARGE SUMMARY   PREOPERATIVE DIAGNOSIS:  Atypical complex hyperplasia of the  endometrium.   FINAL DIAGNOSES:  Well-differentiated endometrial adenocarcinoma  associated with complex atypical hyperplasia and focal superficial  myometrial invasion.   OPERATIONS AND PROCEDURES:  Laparoscopically assisted total vaginal  hysteroscopy and bilateral salpingo-oophorectomy, anterior colporrhaphy.  General anesthesia.   BRIEF HISTORY:  The patient is a 70 year old white female who was  initially evaluated in April 2008 because of postmenopausal vaginal  bleeding. The patient underwent a D&C, hysteroscopy and LEEP procedure  on November 11, 2006 at which time she was noted to have an endometrial  polyp with simple and focal complex hyperplasia. Endometrial curettings  were obtained, and this revealed complex hyperplasia with atypia. The  patient previously had an atypical Pap smear.  The LEEP procedure done  in April of 2008 revealed benign squamous mucosa. Because of the  atypical complex hyperplasia, the patient was advised as to the options  and agreed to undergo total vaginal hysterectomy and bilateral salpingo-  oophorectomy, laparoscopically assisted. In addition, the patient  reported signs and symptoms of stress incontinence. The patient was  admitted. Her admission hemoglobin was 13.5, white count was 5100.  Chemistry profile and urinalysis were within normal limits. On Dec 29, 2006 under general anesthesia, laparoscopically assisted total vaginal  hysteroscopy with bilateral salpingo-oophorectomy was carried out  without difficulty along with  an anterior colporrhaphy. The patient  tolerated the surgical procedure well, had initial postoperative  problems with nausea which resolved with IV fluids and liquids. By the  third postoperative day, nausea had resolved, and the patient was in  satisfactory condition. Her hemoglobin remained stable and at the time  of discharge was 10.9 with white count of 7.7. The patient was  instructed no heavy lifting and place nothing in vagina and return to  the office in 2 weeks for followup examination. The final pathology,  however, on the hysterectomy specimen revealed well-differentiated  adenocarcinoma of the endometrium associated with complex atypical  hyperplasia. There was superficial invasion of 0.4 cm with myometrium of  1.2 cm thickness. There was no vascular or lymphatic invasion. There was  no cervical involvement. At the time of surgery, lymph node biopsies  were not taken due to the benign findings at the time of the [facility]  revealing complex hyperplasia and no carcinoma. The plan is for the  patient to be seen back in 2 weeks. Consultation will be arranged with  Dr. Stanford Breed and his  group at the [facility] to see if additional therapy will  be necessary. The patient was instructed no heavy lifting and place  nothing in the vagina and to call if there  are any problems such as  fever, pain or heavy bleeding.      Miguel Aschoff, M.D.  Electronically Signed     AR/MEDQ  D:  01/04/2007  T:  01/05/2007  Job:  [id number]   cc:   De Blanch, M.D.  [address]  [place]  [place] [address]

## 2011-05-04 LAB — COMPREHENSIVE METABOLIC PANEL
ALT: 19
Alkaline Phosphatase: 84
CO2: 28
GFR calc non Af Amer: 57 — ABNORMAL LOW
Glucose, Bld: 86
Potassium: 3.6
Sodium: 137
Total Bilirubin: 0.6

## 2011-05-04 LAB — URINALYSIS, ROUTINE W REFLEX MICROSCOPIC
Hgb urine dipstick: NEGATIVE
Nitrite: NEGATIVE
Specific Gravity, Urine: 1.025
Urobilinogen, UA: 0.2

## 2011-05-04 LAB — CBC
HCT: 42.9
Hemoglobin: 14.5
MCHC: 33.9
MCV: 90.6
Platelets: 178
RBC: 3.85 — ABNORMAL LOW
RBC: 4.73
RDW: 13.1
WBC: 13.1 — ABNORMAL HIGH
WBC: 6.9

## 2011-05-04 LAB — DIFFERENTIAL
Basophils Absolute: 0
Basophils Relative: 0
Eosinophils Absolute: 0.2
Monocytes Relative: 10
Neutrophils Relative %: 57

## 2011-05-04 LAB — PROTIME-INR: INR: 1

## 2011-05-04 LAB — APTT: aPTT: 25

## 2011-11-10 ENCOUNTER — Encounter: Payer: Self-pay | Admitting: Gynecologic Oncology

## 2011-11-11 ENCOUNTER — Encounter: Payer: Self-pay | Admitting: Gynecologic Oncology

## 2011-11-11 ENCOUNTER — Ambulatory Visit: Payer: Medicare Other | Attending: Gynecologic Oncology | Admitting: Gynecologic Oncology

## 2011-11-11 VITALS — BP 126/80 | HR 66 | Temp 98.2°F | Resp 16 | Ht 61.42 in | Wt 198.4 lb

## 2011-11-11 DIAGNOSIS — Z9889 Other specified postprocedural states: Secondary | ICD-10-CM | POA: Insufficient documentation

## 2011-11-11 DIAGNOSIS — K219 Gastro-esophageal reflux disease without esophagitis: Secondary | ICD-10-CM | POA: Diagnosis not present

## 2011-11-11 DIAGNOSIS — Z8542 Personal history of malignant neoplasm of other parts of uterus: Secondary | ICD-10-CM | POA: Insufficient documentation

## 2011-11-11 DIAGNOSIS — I10 Essential (primary) hypertension: Secondary | ICD-10-CM | POA: Insufficient documentation

## 2011-11-11 DIAGNOSIS — Z09 Encounter for follow-up examination after completed treatment for conditions other than malignant neoplasm: Secondary | ICD-10-CM | POA: Insufficient documentation

## 2011-11-11 DIAGNOSIS — Z90710 Acquired absence of both cervix and uterus: Secondary | ICD-10-CM | POA: Insufficient documentation

## 2011-11-11 DIAGNOSIS — C541 Malignant neoplasm of endometrium: Secondary | ICD-10-CM | POA: Insufficient documentation

## 2011-11-11 DIAGNOSIS — C549 Malignant neoplasm of corpus uteri, unspecified: Secondary | ICD-10-CM | POA: Diagnosis not present

## 2011-11-11 NOTE — Patient Instructions (Signed)
Remains without any evidence of disease from her  stage IA endometrial adenocarcinoma.  F/u with Dr. Tenny Craw as scheduled. Discharged from GYN ONC . F/U PRN

## 2011-11-11 NOTE — Progress Notes (Signed)
REASON FOR VISIT: Endometrial cancer surveillance.   HISTORY OF PRESENT ILLNESS: This is a 71 year old who was evaluated by  Dr. Tenny Craw and noted to have a grade 1 endometrial adenocarcinoma. In May  2008 she underwent a laparoscopic-assisted vaginal hysterectomy,  bilateral salpingo-oophorectomy and anterior colporrhaphy. Her final  pathology was notable for a grade 1 endometrioid adenocarcinoma with  superficial myometrial invasion without lymphovascular space invasion.  In 2009, she underwent a sling for urinary incontinence, which is  markedly improved.   PAST MEDICAL HISTORY:  Past Medical History  Diagnosis Date  . Hypertension   . GERD (gastroesophageal reflux disease)   . Endometrial adenocarcinoma     Stage I  . Urinary incontinence    PAST SURGICAL HISTORY:  1. Prior carpal tunnel release.  2. Laparoscopic-assisted vaginal hysterectomy, bilateral salpingo-  oophorectomy with anterior colporrhaphy.  3. Sling for urinary incontinence.   FAMILY HISTORY: No interval changes.   REVIEW OF SYSTEMS: There is no cough, headache, abdominal pain, weight loss.  She denies hematuria, hematochezia, or vaginal bleeding. There is no  unusual edema of the lower extremities. Otherwise 10-point review of  systems is noncontributory.   PHYSICAL EXAMINATION: GENERAL: A well-developed female in no acute  distress.  VITAL SIGNS: BP 126/80  Pulse 66  Temp(Src) 98.2 F (36.8 C) (Oral)  Resp 16  Ht 5' 1.42" (1.56 m)  Wt 198 lb 6.4 oz (89.994 kg)  BMI 36.98 kg/m2 CHEST: Clear to auscultation.  HEART: Regular rate and rhythm.  BACK: No CVA tenderness.  LYMPH NODE SURVEY: No cervical, supraclavicular or inguinal adenopathy.  ABDOMEN: Soft, obese. Laparoscopic port sites without evidence of  hernia or tenderness.  PELVIC EXAMINATION: Normal external genitalia.  There are no unusual  pigmentary changes. Vagina is atrophic without any lesions or masses.  No cul de sac masses.  RECTAL  EXAMINATION: Good anal sphincter tone without any cul-de-sac  masses or rectovaginal septum nodularity.  EXTREMITIES: No CCE  IMPRESSION: Ms. Burklow remains without any evidence of disease from her  stage IA endometrial adenocarcinoma.  F/u with Dr. Tenny Craw as scheduled. Discharged from GYN ONC . F/U PRN

## 2011-12-07 DIAGNOSIS — H6120 Impacted cerumen, unspecified ear: Secondary | ICD-10-CM | POA: Diagnosis not present

## 2011-12-07 DIAGNOSIS — H902 Conductive hearing loss, unspecified: Secondary | ICD-10-CM | POA: Diagnosis not present

## 2012-01-03 DIAGNOSIS — G4733 Obstructive sleep apnea (adult) (pediatric): Secondary | ICD-10-CM | POA: Diagnosis not present

## 2012-01-03 DIAGNOSIS — E782 Mixed hyperlipidemia: Secondary | ICD-10-CM | POA: Diagnosis not present

## 2012-01-03 DIAGNOSIS — R002 Palpitations: Secondary | ICD-10-CM | POA: Diagnosis not present

## 2012-01-21 DIAGNOSIS — R58 Hemorrhage, not elsewhere classified: Secondary | ICD-10-CM | POA: Diagnosis not present

## 2012-01-21 DIAGNOSIS — M79609 Pain in unspecified limb: Secondary | ICD-10-CM | POA: Diagnosis not present

## 2012-02-10 DIAGNOSIS — J069 Acute upper respiratory infection, unspecified: Secondary | ICD-10-CM | POA: Diagnosis not present

## 2012-02-10 DIAGNOSIS — R062 Wheezing: Secondary | ICD-10-CM | POA: Diagnosis not present

## 2012-04-19 DIAGNOSIS — Z79899 Other long term (current) drug therapy: Secondary | ICD-10-CM | POA: Diagnosis not present

## 2012-04-19 DIAGNOSIS — E782 Mixed hyperlipidemia: Secondary | ICD-10-CM | POA: Diagnosis not present

## 2012-05-09 DIAGNOSIS — Z23 Encounter for immunization: Secondary | ICD-10-CM | POA: Diagnosis not present

## 2012-11-15 ENCOUNTER — Other Ambulatory Visit: Payer: Self-pay | Admitting: Obstetrics and Gynecology

## 2012-11-15 DIAGNOSIS — Z124 Encounter for screening for malignant neoplasm of cervix: Secondary | ICD-10-CM | POA: Diagnosis not present

## 2012-11-15 DIAGNOSIS — Z1212 Encounter for screening for malignant neoplasm of rectum: Secondary | ICD-10-CM | POA: Diagnosis not present

## 2012-11-15 DIAGNOSIS — Z1231 Encounter for screening mammogram for malignant neoplasm of breast: Secondary | ICD-10-CM | POA: Diagnosis not present

## 2012-11-15 DIAGNOSIS — Z01419 Encounter for gynecological examination (general) (routine) without abnormal findings: Secondary | ICD-10-CM | POA: Diagnosis not present

## 2013-01-04 ENCOUNTER — Ambulatory Visit (INDEPENDENT_AMBULATORY_CARE_PROVIDER_SITE_OTHER): Payer: Medicare Other | Admitting: Cardiovascular Disease

## 2013-01-04 ENCOUNTER — Encounter: Payer: Self-pay | Admitting: Cardiovascular Disease

## 2013-01-04 VITALS — BP 130/88 | HR 69 | Ht 61.0 in | Wt 201.7 lb

## 2013-01-04 DIAGNOSIS — I10 Essential (primary) hypertension: Secondary | ICD-10-CM | POA: Diagnosis not present

## 2013-01-04 DIAGNOSIS — E785 Hyperlipidemia, unspecified: Secondary | ICD-10-CM | POA: Diagnosis not present

## 2013-01-04 DIAGNOSIS — Z79899 Other long term (current) drug therapy: Secondary | ICD-10-CM

## 2013-01-04 DIAGNOSIS — G4733 Obstructive sleep apnea (adult) (pediatric): Secondary | ICD-10-CM | POA: Diagnosis not present

## 2013-01-04 MED ORDER — LISINOPRIL-HYDROCHLOROTHIAZIDE 20-12.5 MG PO TABS: 1.0000 | ORAL_TABLET | Freq: Every day | ORAL | Status: DC

## 2013-01-04 MED ORDER — METOPROLOL SUCCINATE ER 100 MG PO TB24: 100.0000 mg | ORAL_TABLET | Freq: Every day | ORAL | Status: DC

## 2013-01-04 NOTE — Progress Notes (Signed)
   01/04/2013 Donna Sloan   1941-07-26  [id number]  Primary Physician No primary provider on file. Primary Cardiologist: Runell Gess MD Donna Sloan   HPI:  The patient is a 72 year old, moderately overweight, married, Caucasian female mother of 2, grandmother to 2 grandchildren whose husband is also a patient of mine and who is accompanying her today. I last saw her a year ago. She has a history of PAF, improved on low-dose beta blocker. Her other problems include obstructive sleep apnea; she never pursued a CPAP titration study after her initial diagnostic tests. She has hypertension and hyperlipidemia as well. She also has symptoms compatible with restless leg syndrome. Lower extremity Dopplers are normal. She says that she feels that the simvastatin which I put her on causes nausea ad night. Her total cholesterol a year ago was 243 with an LDL of 156 and HDL of 60. .since I last saw her she denies chest pain or shortness of breath      Current Outpatient Prescriptions  Medication Sig Dispense Refill  . lisinopril-hydrochlorothiazide (PRINZIDE,ZESTORETIC) 20-12.5 MG per tablet Take 1 tablet by mouth daily.      . metoprolol succinate (TOPROL-XL) 100 MG 24 hr tablet Take 100 mg by mouth daily. Take with or immediately following a meal.       No current facility-administered medications for this visit.    Allergies  Allergen Reactions  . Penicillins   . Simvastatin Nausea And Vomiting    History   Social History  . Marital Status: Married    Spouse Name: N/A    Number of Children: N/A  . Years of Education: N/A   Occupational History  . Not on file.   Social History Main Topics  . Smoking status: Never Smoker   . Smokeless tobacco: Not on file  . Alcohol Use: No  . Drug Use: Not on file  . Sexually Active: Not on file   Other Topics Concern  . Not on file   Social History Narrative  . No narrative on file     Review of Systems: General: negative  for chills, fever, night sweats or weight changes.  Cardiovascular: negative for chest pain, dyspnea on exertion, edema, orthopnea, palpitations, paroxysmal nocturnal dyspnea or shortness of breath Dermatological: negative for rash Respiratory: negative for cough or wheezing Urologic: negative for hematuria Abdominal: negative for nausea, vomiting, diarrhea, bright red blood per rectum, melena, or hematemesis Neurologic: negative for visual changes, syncope, or dizziness All other systems reviewed and are otherwise negative except as noted above.    Blood pressure 130/88, pulse 69, height 5\' 1"  (1.549 m), weight 201 lb 11.2 oz (91.491 kg).  General appearance: alert and no distress Neck: no adenopathy, no carotid bruit, no JVD, supple, symmetrical, trachea midline and thyroid not enlarged, symmetric, no tenderness/mass/nodules Lungs: clear to auscultation bilaterally Heart: regular rate and rhythm, S1, S2 normal, no murmur, click, rub or gallop Extremities: extremities normal, atraumatic, no cyanosis or edema  EKG normal sinus rhythm at 69 with nonspecific ST and T wave changes  ASSESSMENT AND PLAN:   Hyperlipidemia Currently not on a statin drug will recheck  HYPERTENSION Well-controlled on current medications      Runell Gess MD [facility] 01/04/2013 2:53 PM

## 2013-01-04 NOTE — Assessment & Plan Note (Signed)
Well-controlled on current medications 

## 2013-01-04 NOTE — Assessment & Plan Note (Signed)
Currently not on a statin drug will recheck

## 2013-01-04 NOTE — Patient Instructions (Signed)
Your physician wants you to follow-up in: 12 months with an extender. You will receive a reminder letter in the mail two months in advance. If you don't receive a letter, please call our office to schedule the follow-up appointment.

## 2013-04-16 DIAGNOSIS — L723 Sebaceous cyst: Secondary | ICD-10-CM | POA: Diagnosis not present

## 2013-04-24 ENCOUNTER — Other Ambulatory Visit: Payer: Self-pay | Admitting: Dermatology

## 2013-04-24 DIAGNOSIS — L723 Sebaceous cyst: Secondary | ICD-10-CM | POA: Diagnosis not present

## 2013-05-14 DIAGNOSIS — Z23 Encounter for immunization: Secondary | ICD-10-CM | POA: Diagnosis not present

## 2013-08-23 DIAGNOSIS — N390 Urinary tract infection, site not specified: Secondary | ICD-10-CM | POA: Diagnosis not present

## 2013-08-23 DIAGNOSIS — N3090 Cystitis, unspecified without hematuria: Secondary | ICD-10-CM | POA: Diagnosis not present

## 2013-08-23 DIAGNOSIS — R300 Dysuria: Secondary | ICD-10-CM | POA: Diagnosis not present

## 2013-11-21 ENCOUNTER — Other Ambulatory Visit: Payer: Self-pay | Admitting: Obstetrics and Gynecology

## 2013-11-21 DIAGNOSIS — R350 Frequency of micturition: Secondary | ICD-10-CM | POA: Diagnosis not present

## 2013-11-21 DIAGNOSIS — L988 Other specified disorders of the skin and subcutaneous tissue: Secondary | ICD-10-CM | POA: Diagnosis not present

## 2013-11-21 DIAGNOSIS — N390 Urinary tract infection, site not specified: Secondary | ICD-10-CM | POA: Diagnosis not present

## 2013-11-21 DIAGNOSIS — Z1231 Encounter for screening mammogram for malignant neoplasm of breast: Secondary | ICD-10-CM | POA: Diagnosis not present

## 2013-11-21 DIAGNOSIS — N3941 Urge incontinence: Secondary | ICD-10-CM | POA: Diagnosis not present

## 2013-11-21 DIAGNOSIS — Z859 Personal history of malignant neoplasm, unspecified: Secondary | ICD-10-CM | POA: Diagnosis not present

## 2013-11-21 DIAGNOSIS — R300 Dysuria: Secondary | ICD-10-CM | POA: Diagnosis not present

## 2013-11-23 ENCOUNTER — Other Ambulatory Visit: Payer: Self-pay | Admitting: Obstetrics and Gynecology

## 2013-11-23 DIAGNOSIS — R928 Other abnormal and inconclusive findings on diagnostic imaging of breast: Secondary | ICD-10-CM

## 2013-12-03 ENCOUNTER — Ambulatory Visit
Admission: RE | Admit: 2013-12-03 | Discharge: 2013-12-03 | Disposition: A | Discharge status: Home / Self Care | Payer: Medicare Other | Source: Ambulatory Visit | Attending: Obstetrics and Gynecology | Admitting: Obstetrics and Gynecology

## 2013-12-03 ENCOUNTER — Encounter (INDEPENDENT_AMBULATORY_CARE_PROVIDER_SITE_OTHER): Payer: Self-pay

## 2013-12-03 DIAGNOSIS — N63 Unspecified lump in unspecified breast: Secondary | ICD-10-CM | POA: Diagnosis not present

## 2013-12-03 DIAGNOSIS — R928 Other abnormal and inconclusive findings on diagnostic imaging of breast: Secondary | ICD-10-CM

## 2014-01-15 ENCOUNTER — Encounter: Payer: Self-pay | Admitting: Cardiology

## 2014-01-15 ENCOUNTER — Ambulatory Visit (INDEPENDENT_AMBULATORY_CARE_PROVIDER_SITE_OTHER): Payer: Medicare Other | Admitting: Cardiology

## 2014-01-15 VITALS — BP 142/74 | HR 80 | Ht 62.0 in | Wt 193.1 lb

## 2014-01-15 DIAGNOSIS — E785 Hyperlipidemia, unspecified: Secondary | ICD-10-CM | POA: Diagnosis not present

## 2014-01-15 DIAGNOSIS — I480 Paroxysmal atrial fibrillation: Secondary | ICD-10-CM

## 2014-01-15 DIAGNOSIS — I10 Essential (primary) hypertension: Secondary | ICD-10-CM | POA: Diagnosis not present

## 2014-01-15 DIAGNOSIS — I4891 Unspecified atrial fibrillation: Secondary | ICD-10-CM

## 2014-01-15 MED ORDER — LISINOPRIL-HYDROCHLOROTHIAZIDE 20-12.5 MG PO TABS: 1.0000 | ORAL_TABLET | Freq: Every day | ORAL | Status: DC

## 2014-01-15 MED ORDER — METOPROLOL SUCCINATE ER 100 MG PO TB24: 100.0000 mg | ORAL_TABLET | Freq: Every day | ORAL | Status: DC

## 2014-01-15 NOTE — Assessment & Plan Note (Signed)
No recent recurrence.  Continue current medications

## 2014-01-15 NOTE — Assessment & Plan Note (Signed)
Controlled she'll continue current medications

## 2014-01-15 NOTE — Progress Notes (Signed)
01/15/2014   PCP: Stephens Shire, MD   Chief Complaint  Patient presents with  . Annual Exam    no complaints; needs labs    Primary Cardiologist:Dr. Adora Fridge   HPI:  73 year old, moderately overweight, married, Caucasian female mother of 2, grandmother to 2 grandchildren whose husband is also a patient of Dr. Adora Fridge and who is accompanying her today. She has a history of PAF, improved on low-dose beta blocker. Her other problems include obstructive sleep apnea; she never pursued a CPAP titration study after her initial diagnostic tests. She has hypertension and hyperlipidemia as well. She also has symptoms compatible with restless leg syndrome. Lower extremity Dopplers are normal. She says that she felt that simvastatin caused nausea and vomiting at night. Her total cholesterol 2 years ago was 243 with an LDL of 156 and HDL of 60. She did not get this rechecked after last visit.  Today she is here for recheck. She has no chest pain and no shortness of breath has not had any awareness of rapid heartbeat.   She feels quite well and is active. She does not exercise but even walking due to her husband's illness with back problems and his difficulty with ambulation.      Allergies  Allergen Reactions  . Penicillins   . Simvastatin Nausea And Vomiting    Current Outpatient Prescriptions  Medication Sig Dispense Refill  . aspirin 81 MG tablet Take 81 mg by mouth as needed for pain.      [name] lisinopril-hydrochlorothiazide (PRINZIDE,ZESTORETIC) 20-12.5 MG per tablet Take 1 tablet by mouth daily.  30 tablet  11  . metoprolol succinate (TOPROL-XL) 100 MG 24 hr tablet Take 1 tablet (100 mg total) by mouth daily. Take with or immediately following a meal.  30 tablet  11   No current facility-administered medications for this visit.    Past Medical History  Diagnosis Date  . Hypertension   . GERD (gastroesophageal reflux disease)   . Endometrial adenocarcinoma     Stage  I  . Urinary incontinence   . Chest pain     2D ECHO, 11/24/2010 - EF >55%, NUCLEAR STRESS TEST, 11/24/2010 - normal, no EKG changes for ischemia  . Claudication     LEA DUPLEX, 12/15/2010 - normal scan  . Hyperlipidemia     Past Surgical History  Procedure Laterality Date  . Carpal tunnel release    . Laparoscopic assisted vaginal hysterectomy  12/2006    BSO with anterior colporrhaphy  . Bladder suspension      YYT:KPTWSFK:CL colds or fevers, no weight changes Skin:no rashes or ulcers HEENT:no blurred vision, no congestion CV:see HPI PUL:see HPI GI:no diarrhea constipation or melena, no indigestion GU:no hematuria, no dysuria MS:no joint pain, no claudication Neuro:no syncope, no lightheadedness Endo:no diabetes, no thyroid disease  Wt Readings from Last 3 Encounters:  01/15/14 193 lb 1.6 oz (87.59 kg)  01/04/13 201 lb 11.2 oz (91.491 kg)  11/11/11 198 lb 6.4 oz (89.994 kg)    PHYSICAL EXAM BP 142/74  Pulse 80  Ht 5\' 2"  (1.575 m)  Wt 193 lb 1.6 oz (87.59 kg)  BMI 35.31 kg/m2  Re check BP 130/70 General:Pleasant affect, NAD Skin:Warm and dry, brisk capillary refill HEENT:normocephalic, sclera clear, mucus membranes moist Neck:supple, no JVD, no bruits  Heart:S1S2 RRR without murmur, gallup, rub or click Lungs:clear without rales, rhonchi, or wheezes EXN:TZGY, non tender, + BS, do not palpate liver spleen or  masses Ext:no lower ext edema, 2+ pedal pulses, 2+ radial pulses Neuro:alert and oriented, MAE, follows commands, + facial symmetry  EKG: SR no acute changes from 12/2012  ASSESSMENT AND PLAN PAF (paroxysmal atrial fibrillation) No recent recurrence.  Continue current medications  Hyperlipidemia She has not had a recent lipid panel, we will have her do that in the next 2-[redacted] weeks along with a CMP. Assuming her LDL is elevated she agreed to proceed with Crestor.[name]  HYPERTENSION Controlled she'll continue current medications    her medications were refilled  and she will follow up with Dr. Gwenlyn Found in one year unless there are problems in the meantime.

## 2014-01-15 NOTE — Patient Instructions (Signed)
Follow up with Dr. Gwenlyn Found in 1 year, or if problems prior to that time.  Heart healthy diet

## 2014-01-15 NOTE — Assessment & Plan Note (Signed)
She has not had a recent lipid panel, we will have her do that in the next 2-[redacted] weeks along with a CMP. Assuming her LDL is elevated she agreed to proceed with Crestor.[name]

## 2014-01-16 DIAGNOSIS — I4891 Unspecified atrial fibrillation: Secondary | ICD-10-CM | POA: Diagnosis not present

## 2014-01-16 DIAGNOSIS — E785 Hyperlipidemia, unspecified: Secondary | ICD-10-CM | POA: Diagnosis not present

## 2014-01-16 LAB — LIPID PANEL
CHOL/HDL RATIO: 4.3 ratio
CHOLESTEROL: 238 mg/dL — AB (ref 0–200)
HDL: 56 mg/dL (ref >39)
LDL CALC: 153 mg/dL — AB (ref 0–99)
TRIGLYCERIDES: 144 mg/dL (ref <150)
VLDL: 29 mg/dL (ref 0–40)

## 2014-01-16 LAB — COMPLETE METABOLIC PANEL WITH GFR
ALK PHOS: 106 U/L (ref 39–117)
ALT: 40 U/L — AB (ref 0–35)
AST: 39 U/L — AB (ref 0–37)
Albumin: 4.3 g/dL (ref 3.5–5.2)
BILIRUBIN TOTAL: 0.4 mg/dL (ref 0.2–1.2)
BUN: 22 mg/dL (ref 6–23)
CALCIUM: 9.8 mg/dL (ref 8.4–10.5)
CHLORIDE: 101 meq/L (ref 96–112)
CO2: 29 mEq/L (ref 19–32)
CREATININE: 1.06 mg/dL (ref 0.50–1.10)
GFR, Est African American: 61 mL/min
GFR, Est Non African American: 53 mL/min — ABNORMAL LOW
Glucose, Bld: 97 mg/dL (ref 70–99)
Potassium: 4.3 mEq/L (ref 3.5–5.3)
Sodium: 139 mEq/L (ref 135–145)
Total Protein: 7.4 g/dL (ref 6.0–8.3)

## 2014-01-18 ENCOUNTER — Telehealth: Payer: Self-pay | Admitting: *Deleted

## 2014-01-18 DIAGNOSIS — E785 Hyperlipidemia, unspecified: Secondary | ICD-10-CM

## 2014-01-18 DIAGNOSIS — Z79899 Other long term (current) drug therapy: Secondary | ICD-10-CM

## 2014-01-18 MED ORDER — ROSUVASTATIN CALCIUM 5 MG PO TABS: 5.0000 mg | ORAL_TABLET | Freq: Every day | ORAL | Status: DC

## 2014-01-18 NOTE — Telephone Encounter (Signed)
Patient notified of labs - informed to start crestor 5 and repeat labs in 4 weeks. Patient agreeable. Lab slip mailed to patient.

## 2014-01-18 NOTE — Telephone Encounter (Signed)
Message copied by Fidel Levy on Fri Jan 18, 2014  7:53 AM ------      Message from: Isaiah Serge      Created: Thu Jan 17, 2014 12:23 PM       Please let pt know her cholesterol is elevated bad cholesterol is 153- would like 100 or less, add Crestor 5 mg daily and recheck hepatic panel and lipids in 4 weeks after starting.  Thanks. ------

## 2014-04-10 ENCOUNTER — Encounter (HOSPITAL_COMMUNITY): Payer: Self-pay | Admitting: Emergency Medicine

## 2014-04-10 ENCOUNTER — Emergency Department (HOSPITAL_COMMUNITY): Payer: Medicare Other

## 2014-04-10 ENCOUNTER — Emergency Department (HOSPITAL_COMMUNITY)
Admission: EM | Admit: 2014-04-10 | Discharge: 2014-04-10 | Disposition: A | Discharge status: Home / Self Care | Payer: Medicare Other | Attending: Emergency Medicine | Admitting: Emergency Medicine

## 2014-04-10 DIAGNOSIS — K5730 Diverticulosis of large intestine without perforation or abscess without bleeding: Secondary | ICD-10-CM | POA: Diagnosis not present

## 2014-04-10 DIAGNOSIS — Z7982 Long term (current) use of aspirin: Secondary | ICD-10-CM | POA: Diagnosis not present

## 2014-04-10 DIAGNOSIS — R197 Diarrhea, unspecified: Secondary | ICD-10-CM | POA: Diagnosis not present

## 2014-04-10 DIAGNOSIS — I10 Essential (primary) hypertension: Secondary | ICD-10-CM | POA: Insufficient documentation

## 2014-04-10 DIAGNOSIS — R5383 Other fatigue: Secondary | ICD-10-CM | POA: Diagnosis not present

## 2014-04-10 DIAGNOSIS — R109 Unspecified abdominal pain: Secondary | ICD-10-CM | POA: Diagnosis not present

## 2014-04-10 DIAGNOSIS — E785 Hyperlipidemia, unspecified: Secondary | ICD-10-CM | POA: Insufficient documentation

## 2014-04-10 DIAGNOSIS — Z880 Allergy status to penicillin: Secondary | ICD-10-CM | POA: Diagnosis not present

## 2014-04-10 DIAGNOSIS — Z8742 Personal history of other diseases of the female genital tract: Secondary | ICD-10-CM | POA: Diagnosis not present

## 2014-04-10 DIAGNOSIS — Z79899 Other long term (current) drug therapy: Secondary | ICD-10-CM | POA: Diagnosis not present

## 2014-04-10 DIAGNOSIS — R5381 Other malaise: Secondary | ICD-10-CM | POA: Diagnosis not present

## 2014-04-10 DIAGNOSIS — Z8719 Personal history of other diseases of the digestive system: Secondary | ICD-10-CM | POA: Insufficient documentation

## 2014-04-10 LAB — CBC WITH DIFFERENTIAL/PLATELET
BASOS PCT: 0 % (ref 0–1)
Basophils Absolute: 0 10*3/uL (ref 0.0–0.1)
Eosinophils Absolute: 0.3 10*3/uL (ref 0.0–0.7)
Eosinophils Relative: 2 % (ref 0–5)
HEMATOCRIT: 43.1 % (ref 36.0–46.0)
Hemoglobin: 14.8 g/dL (ref 12.0–15.0)
Lymphocytes Relative: 31 % (ref 12–46)
Lymphs Abs: 3.3 10*3/uL (ref 0.7–4.0)
MCH: 30.6 pg (ref 26.0–34.0)
MCHC: 34.3 g/dL (ref 30.0–36.0)
MCV: 89.2 fL (ref 78.0–100.0)
MONO ABS: 0.9 10*3/uL (ref 0.1–1.0)
Monocytes Relative: 8 % (ref 3–12)
NEUTROS ABS: 6.2 10*3/uL (ref 1.7–7.7)
Neutrophils Relative %: 59 % (ref 43–77)
Platelets: 200 10*3/uL (ref 150–400)
RBC: 4.83 MIL/uL (ref 3.87–5.11)
RDW: 12.6 % (ref 11.5–15.5)
WBC: 10.7 10*3/uL — ABNORMAL HIGH (ref 4.0–10.5)

## 2014-04-10 LAB — URINALYSIS, ROUTINE W REFLEX MICROSCOPIC
Bilirubin Urine: NEGATIVE
Glucose, UA: NEGATIVE mg/dL
Hgb urine dipstick: NEGATIVE
Ketones, ur: NEGATIVE mg/dL
Leukocytes, UA: NEGATIVE
Nitrite: NEGATIVE
PH: 5 (ref 5.0–8.0)
Protein, ur: NEGATIVE mg/dL
Specific Gravity, Urine: 1.023 (ref 1.005–1.030)
Urobilinogen, UA: 0.2 mg/dL (ref 0.0–1.0)

## 2014-04-10 LAB — COMPREHENSIVE METABOLIC PANEL
ALBUMIN: 3.7 g/dL (ref 3.5–5.2)
ALT: 40 U/L — ABNORMAL HIGH (ref 0–35)
ANION GAP: 14 (ref 5–15)
AST: 48 U/L — AB (ref 0–37)
Alkaline Phosphatase: 109 U/L (ref 39–117)
BILIRUBIN TOTAL: 0.2 mg/dL — AB (ref 0.3–1.2)
BUN: 14 mg/dL (ref 6–23)
CALCIUM: 10.2 mg/dL (ref 8.4–10.5)
CHLORIDE: 101 meq/L (ref 96–112)
CO2: 22 mEq/L (ref 19–32)
CREATININE: 0.94 mg/dL (ref 0.50–1.10)
GFR calc Af Amer: 69 mL/min — ABNORMAL LOW (ref >90)
GFR calc non Af Amer: 59 mL/min — ABNORMAL LOW (ref >90)
Glucose, Bld: 114 mg/dL — ABNORMAL HIGH (ref 70–99)
Potassium: 4 mEq/L (ref 3.7–5.3)
Sodium: 137 mEq/L (ref 137–147)
Total Protein: 8.2 g/dL (ref 6.0–8.3)

## 2014-04-10 LAB — I-STAT TROPONIN, ED: Troponin i, poc: 0.03 ng/mL (ref 0.00–0.08)

## 2014-04-10 LAB — LIPASE, BLOOD: Lipase: 41 U/L (ref 11–59)

## 2014-04-10 MED ORDER — IOHEXOL 300 MG/ML  SOLN
100.0000 mL | Freq: Once | INTRAMUSCULAR | Status: AC | PRN
  Administered 2014-04-10: 100 mL via INTRAVENOUS

## 2014-04-10 MED ORDER — SODIUM CHLORIDE 0.9 % IV SOLN
INTRAVENOUS | Status: DC
Start: 2014-04-10 — End: 2014-04-11
  Administered 2014-04-10: 21:00:00 via INTRAVENOUS

## 2014-04-10 NOTE — ED Provider Notes (Signed)
CSN: [id number]     Arrival date & time 04/10/14  1955 History   First MD Initiated Contact with Patient 04/10/14 2025     Chief Complaint  Patient presents with  . Diarrhea  . Abdominal Pain    generalized      HPI Pt was seen at 2030. Per pt, c/o gradual onset and persistence of multiple intermittent daily episodes of diarrhea for the past 1 to 2 weeks. Describes the stools as "yellow" and "mucus." Has been associated with generalized "cramping" and "sore" abd pain. States "everything I eat just goes through me." Denies N/V, no CP/SOB, no back pain, no fevers, no black or blood in stools, denies recent travel, no sick contacts, no recent antibiotic use.    GI: Dr. Olevia Perches Past Medical History  Diagnosis Date  . Hypertension   . GERD (gastroesophageal reflux disease)   . Endometrial adenocarcinoma     Stage I  . Urinary incontinence   . Chest pain     2D ECHO, 11/24/2010 - EF >55%, NUCLEAR STRESS TEST, 11/24/2010 - normal, no EKG changes for ischemia  . Claudication     LEA DUPLEX, 12/15/2010 - normal scan  . Hyperlipidemia    Past Surgical History  Procedure Laterality Date  . Carpal tunnel release    . Laparoscopic assisted vaginal hysterectomy  12/2006    BSO with anterior colporrhaphy  . Bladder suspension     Family History  Problem Relation Age of Onset  . Heart attack Father     Massive  . Heart attack Brother     Massive  . Kidney disease Maternal Grandmother   . Heart attack Maternal Grandfather   . Heart attack Paternal Grandfather    History  Substance Use Topics  . Smoking status: Never Smoker   . Smokeless tobacco: Not on file  . Alcohol Use: No    Review of Systems ROS: Statement: All systems negative except as marked or noted in the HPI; Constitutional: Negative for fever and chills. ; ; Eyes: Negative for eye pain, redness and discharge. ; ; ENMT: Negative for ear pain, hoarseness, nasal congestion, sinus pressure and sore throat. ; ; Cardiovascular:  Negative for chest pain, palpitations, diaphoresis, dyspnea and peripheral edema. ; ; Respiratory: Negative for cough, wheezing and stridor. ; ; Gastrointestinal: +diarrhea, abd pain. Negative for nausea, vomiting, blood in stool, hematemesis, jaundice and rectal bleeding. . ; ; Genitourinary: Negative for dysuria, flank pain and hematuria. ; ; Musculoskeletal: Negative for back pain and neck pain. Negative for swelling and trauma.; ; Skin: Negative for pruritus, rash, abrasions, blisters, bruising and skin lesion.; ; Neuro: Negative for headache, lightheadedness and neck stiffness. Negative for weakness, altered level of consciousness , altered mental status, extremity weakness, paresthesias, involuntary movement, seizure and syncope.      Allergies  Penicillins and Simvastatin  Home Medications   Prior to Admission medications   Medication Sig Start Date End Date Taking? Authorizing Provider  aspirin 81 MG tablet Take 81 mg by mouth as needed for pain.    Historical Provider, MD  lisinopril-hydrochlorothiazide (PRINZIDE,ZESTORETIC) 20-12.5 MG per tablet Take 1 tablet by mouth daily. 01/15/14   Cecilie Kicks, NP  metoprolol succinate (TOPROL-XL) 100 MG 24 hr tablet Take 1 tablet (100 mg total) by mouth daily. Take with or immediately following a meal. 01/15/14   Cecilie Kicks, NP  rosuvastatin (CRESTOR) 5 MG tablet Take 1 tablet (5 mg total) by mouth daily. 01/18/14   Cecilie Kicks, NP  BP 190/75  Pulse 87  Temp(Src) 98.3 F (36.8 C) (Oral)  Resp 18  SpO2 98% Physical Exam 2035: Physical examination:  Nursing notes reviewed; Vital signs and O2 SAT reviewed;  Constitutional: Well developed, Well nourished, Well hydrated, In no acute distress; Head:  Normocephalic, atraumatic; Eyes: EOMI, PERRL, No scleral icterus; ENMT: Mouth and pharynx normal, Mucous membranes moist; Neck: Supple, Full range of motion, No lymphadenopathy; Cardiovascular: Regular rate and rhythm, No gallop; Respiratory: Breath  sounds clear & equal bilaterally, No rales, rhonchi, wheezes.  Speaking full sentences with ease, Normal respiratory effort/excursion; Chest: Nontender, Movement normal; Abdomen: Soft, +mild diffuse tenderness to palp. No rebound or guarding. Nondistended, Normal bowel sounds; Genitourinary: No CVA tenderness; Extremities: Pulses normal, No tenderness, No edema, No calf edema or asymmetry.; Neuro: AA&Ox3, Major CN grossly intact.  Speech clear. No gross focal motor or sensory deficits in extremities.; Skin: Color normal, Warm, Dry.   ED Course  Procedures     EKG Interpretation None      MDM  MDM Reviewed: previous chart, nursing note and vitals Reviewed previous: labs and ECG Interpretation: labs, ECG, x-ray and CT scan    Results for orders placed during the hospital encounter of 04/10/14  COMPREHENSIVE METABOLIC PANEL      Result Value Ref Range   Sodium 137  137 - 147 mEq/L   Potassium 4.0  3.7 - 5.3 mEq/L   Chloride 101  96 - 112 mEq/L   CO2 22  19 - 32 mEq/L   Glucose, Bld 114 (*) 70 - 99 mg/dL   BUN 14  6 - 23 mg/dL   Creatinine, Ser 0.94  0.50 - 1.10 mg/dL   Calcium 10.2  8.4 - 10.5 mg/dL   Total Protein 8.2  6.0 - 8.3 g/dL   Albumin 3.7  3.5 - 5.2 g/dL   AST 48 (*) 0 - 37 U/L   ALT 40 (*) 0 - 35 U/L   Alkaline Phosphatase 109  39 - 117 U/L   Total Bilirubin 0.2 (*) 0.3 - 1.2 mg/dL   GFR calc non Af Amer 59 (*) >90 mL/min   GFR calc Af Amer 69 (*) >90 mL/min   Anion gap 14  5 - 15  CBC WITH DIFFERENTIAL      Result Value Ref Range   WBC 10.7 (*) 4.0 - 10.5 K/uL   RBC 4.83  3.87 - 5.11 MIL/uL   Hemoglobin 14.8  12.0 - 15.0 g/dL   HCT 43.1  36.0 - 46.0 %   MCV 89.2  78.0 - 100.0 fL   MCH 30.6  26.0 - 34.0 pg   MCHC 34.3  30.0 - 36.0 g/dL   RDW 12.6  11.5 - 15.5 %   Platelets 200  150 - 400 K/uL   Neutrophils Relative % 59  43 - 77 %   Neutro Abs 6.2  1.7 - 7.7 K/uL   Lymphocytes Relative 31  12 - 46 %   Lymphs Abs 3.3  0.7 - 4.0 K/uL   Monocytes Relative 8   3 - 12 %   Monocytes Absolute 0.9  0.1 - 1.0 K/uL   Eosinophils Relative 2  0 - 5 %   Eosinophils Absolute 0.3  0.0 - 0.7 K/uL   Basophils Relative 0  0 - 1 %   Basophils Absolute 0.0  0.0 - 0.1 K/uL  LIPASE, BLOOD      Result Value Ref Range   Lipase 41  11 - 59  U/L  URINALYSIS, ROUTINE W REFLEX MICROSCOPIC      Result Value Ref Range   Color, Urine YELLOW  YELLOW   APPearance CLEAR  CLEAR   Specific Gravity, Urine 1.023  1.005 - 1.030   pH 5.0  5.0 - 8.0   Glucose, UA NEGATIVE  NEGATIVE mg/dL   Hgb urine dipstick NEGATIVE  NEGATIVE   Bilirubin Urine NEGATIVE  NEGATIVE   Ketones, ur NEGATIVE  NEGATIVE mg/dL   Protein, ur NEGATIVE  NEGATIVE mg/dL   Urobilinogen, UA 0.2  0.0 - 1.0 mg/dL   Nitrite NEGATIVE  NEGATIVE   Leukocytes, UA NEGATIVE  NEGATIVE  I-STAT TROPOININ, ED      Result Value Ref Range   Troponin i, poc 0.03  0.00 - 0.08 ng/mL   Comment 3            Dg Chest 2 View 04/10/2014   CLINICAL DATA:  Epigastric pain, diarrhea, and weakness.  EXAM: CHEST  2 VIEW  COMPARISON:  None.  FINDINGS: Linear infiltration or atelectasis in the left lung base. Right lung is clear. Heart size and pulmonary vascularity are normal. No blunting of costophrenic angles. No pneumothorax. Degenerative changes in the spine and shoulders.  IMPRESSION: Linear infiltration or atelectasis in the left lung base.   Electronically Signed   By: Lucienne Capers M.D.   On: 04/10/2014 21:19   Ct Abdomen Pelvis W Contrast 04/10/2014   CLINICAL DATA:  Abdominal pain, diarrhea  EXAM: CT ABDOMEN AND PELVIS WITH CONTRAST  TECHNIQUE: Multidetector CT imaging of the abdomen and pelvis was performed using the standard protocol following bolus administration of intravenous contrast.  CONTRAST:  137mL OMNIPAQUE IOHEXOL 300 MG/ML  SOLN  COMPARISON:  None.  FINDINGS: Mild scarring in the left lower lobe/ lingula.  Liver, spleen, pancreas, and adrenal glands within normal limits.  Gallbladder is unremarkable. No intrahepatic  or extrahepatic ductal dilatation.  Kidneys within normal limits.  No hydronephrosis.  No evidence of bowel obstruction. Normal appendix. Colonic diverticulosis, without associated inflammatory changes to suggest acute diverticulitis.  Atherosclerotic calcifications of the abdominal aorta and branch vessels.  No abdominopelvic ascites.  No suspicious abdominopelvic lymphadenopathy.  Status post hysterectomy.  No adnexal masses.  Bladder is within normal limits.  Degenerative changes of the pubic symphysis with associated bursal fluid collection (series 2/image 82). Degenerative changes of the visualized thoracolumbar spine, most prominent at L5-S1.  IMPRESSION: No evidence of bowel obstruction.  Normal appendix.  Colonic diverticulosis, without evidence of diverticulitis.  No CT findings to account for the patient's abdominal pain.   Electronically Signed   By: Julian Hy M.D.   On: 04/10/2014 22:34    2245:  Pt has tol PO well while in the ED without N/V.  Pt has stooled while in the ED; sample sent for PCR cdiff and GI pathogen panel. Abd benign, VSS. Pt is not orthostatic on VS. Pt states she feels better and wants to go home now. Dx and testing d/w pt and family.  Questions answered.  Verb understanding, agreeable to d/c home with outpt f/u.    Francine Graven, DO 04/12/14 1440

## 2014-04-10 NOTE — ED Notes (Signed)
Patient transported to CT 

## 2014-04-10 NOTE — Discharge Instructions (Signed)
°Emergency Department Resource Guide °1) Find a Doctor and Pay Out of Pocket °Although you won't have to find out who is covered by your insurance plan, it is a good idea to ask around and get recommendations. You will then need to call the office and see if the doctor you have chosen will accept you as a new patient and what types of options they offer for patients who are self-pay. Some doctors offer discounts or will set up payment plans for their patients who do not have insurance, but you will need to ask so you aren't surprised when you get to your appointment. ° °2) Contact Your Local Health Department °Not all health departments have doctors that can see patients for sick visits, but many do, so it is worth a call to see if yours does. If you don't know where your local health department is, you can check in your phone book. The CDC also has a tool to help you locate your state's health department, and many state websites also have listings of all of their local health departments. ° °3) Find a Walk-in Clinic °If your illness is not likely to be very severe or complicated, you may want to try a walk in clinic. These are popping up all over the country in pharmacies, drugstores, and shopping centers. They're usually staffed by nurse practitioners or physician assistants that have been trained to treat common illnesses and complaints. They're usually fairly quick and inexpensive. However, if you have serious medical issues or chronic medical problems, these are probably not your best option. ° °No Primary Care Doctor: °- Call [facility] Connect at  832-8000 - they can help you locate a primary care doctor that  accepts your insurance, provides certain services, etc. °- Physician Referral Service- 1-800-533-3463 ° °Chronic Pain Problems: °Organization         Address  Phone   Notes  °[facility]  (336) 297-2271 Patients need to be referred by their primary care doctor.  ° °Medication  Assistance: °Organization         Address  Phone   Notes  °[place] [facility] [address] °[place], [place] [address] (336) 641-8030 --Must be a resident of [place] °-- Must have NO insurance coverage whatsoever (no Medicaid/ Medicare, etc.) °-- The pt. MUST have a primary care doctor that directs their care regularly and follows them in the community °  °[facility]  (866) 331-1348   °[employer]  (888) 892-1162   ° °Agencies that provide inexpensive medical care: °Organization         Address  Phone   Notes  °[facility]  (336) 832-8035   ° Internal Medicine    (336) 832-7272   °[facility] [address] °[place], [place] [address] (336) 832-4777   °[facility] [address], °[place] (336) 271-4999   °[facility]    (336) 373-0678   °[facility]    (336) 272-1050   °[facility] ° [address], [place] Phone:  (336) 832-4444, Fax:  (336) 832-4440 Hours of Operation:  9 am - 6 pm, M-F.  Also accepts Medicaid/Medicare and self-pay.  °[facility] ° [address], [place] Phone: (336) 832-3150, Fax: (336) [phone]. Hours of Operation:  8:30 am - 5:30 pm, M-F.  Also accepts Medicaid and self-pay.  °[facility] [place] [address], [place] Phone: (336) 878-6027   °[facility] [address], [place], [place] (336)723-1848, Ext. 123 Mondays & Thursdays: 7-9 AM.  First 15 patients are seen on a first come, first serve basis. °  ° °Medicaid-accepting [place] Providers: ° °Organization         Address  Phone   Notes  °[facility] [address], [place] (336) 641-2100 Also accepts self-pay patients.  °[facility] [address], [place] ° (336) 856-9996   °[facility] [address], [place]  (336) 288-8857   °[facility] [address] [place] [address], [place] (336) 299-7000   °Veita Bland [address], [place]  ° (336) 373-1557 Only accepts [place] Access Medicaid patients after they have their name applied to their card.  ° °Self-Pay (no insurance) in [place]: ° °Organization         Address  Phone   Notes  °Sickle Cell Patients, [facility] [address], [place] (336) 832-1970   °[facility] [address], [place] (336) 832-4400   °[facility] [place] ° [address],  (336) 992-4800   °[facility]/Dr. Osei-Bonsu ° [address] [place] [address], [place] or [address], [place] (336) [phone] Phone number for both [place] and [place] locations is the same.  °[facility] [address], [place] (336) 299-0000   °[facility] [address] [place] [address], [place] or [address] (336) 852-7530 °(336) 878-2260   °[facility] [address], [place] (336) 350-1642, phone; (336) 294-5005, fax Sees patients 1st and 3rd Saturday of every month.  Must not qualify for public or private insurance (i.e. Medicaid, Medicare, [place] Health Choice, Veterans' Benefits) • Household income should be no more than 200% of the poverty level •The clinic cannot treat you if you are pregnant or think you are pregnant • Sexually transmitted diseases are not treated at the clinic.  ° ° °Dental Care: °Organization         Address  Phone  Notes  °[place] [facility] [address], [place] (336) 641-6152 Accepts children up to age 21 who are enrolled in Medicaid or [place] Health Choice; pregnant women with a Medicaid card; and children who have applied for Medicaid or [place] Health Choice, but were declined, whose parents can pay a reduced fee at time of service.  °[place]  [facility] [place]  [address], [place] (336) 641-7733 Accepts children up to age 21 who are enrolled in Medicaid or [place] Health Choice; pregnant women with a Medicaid card; and children who have applied for Medicaid or [place] Health Choice, but were declined, whose parents can pay a reduced fee at time of service.  °[facility] ° [address], [place] (336) 641-4533 Patients are seen by appointment only. Walk-ins are not accepted. [facility] will see patients 18 years of age and older. °Monday - Tuesday (8am-5pm) °Most Wednesdays (8:30-5pm) °$30 per visit, cash only  °[facility] ° [address], [place] (336) 641-4533 Patients are seen by appointment only. Walk-ins are not accepted. [facility] will see patients 18 years of age and older. °One   Wednesday Evening (Monthly: Volunteer Based).  $30 per visit, cash only  °[facility]  (919) 537-3737 for adults; Children under age 4, call Graduate Pediatric Dentistry at ([phone]) [phone]. Children aged 4-14, please call ([phone]) [phone] to request a pediatric application. ° Dental services are provided in all areas of dental care including fillings, crowns and bridges, complete and partial dentures, implants, gum treatment, root canals, and extractions. Preventive care is also provided. Treatment is provided to both adults and children. °Patients are selected via a lottery and there is often a waiting list. °  °[facility] [address], °[place] ° (336) 763-8833 [email or web address] °  °[facility] [address], [place], [place] (336)723-1848, Ext. 123 Second and Fourth Thursday of each month, opens at 6:30 AM; Clinic ends at 9 AM.  Patients are seen on a first-come first-served basis, and a limited number are seen during each clinic.  ° °[facility] ° [address], [place], [place] (336) [phone]    Eligibility Requirements °You must have lived in [place], or [place] counties for at least the last three months. °  You cannot be eligible for state or federal sponsored healthcare insurance, including Veterans Administration, Medicaid, or Medicare. °  You generally cannot be eligible for healthcare insurance through your employer.  °  How to apply: °Eligibility screenings are held every Tuesday and Wednesday afternoon from 1:00 pm until 4:00 pm. You do not need an appointment for the interview!  °[facility] [address], [place], [place] 336-631-2330   °[facility]  336-342-8273   °[facility]  336-703-3100   °[facility]  336-570-6415   ° °Behavioral Health Resources in the Community: °Intensive Outpatient Programs °Organization         Address  Phone  Notes  °[facility] [address], [place], [place] 336-878-6098   °[facility] [address], [place], [place] 336-832-9800   °[facility] [address], [place], [place] ° 336-882-2125   °[facility] [address],  °[place], [place] 1-800-853-5163 or 336-641-4981   °Substance Abuse Resources °Organization         Address  Phone  Notes  °[facility]  336-882-2125   °[facility]  336-784-9470   °[facility] [place] [facility]  336-285-9073   °[facility]  336-845-3988   °Residential & Outpatient Substance Abuse Program  1-800-659-3381   °Psychological Services °Organization         Address  Phone  Notes  °[facility]  336- 832-9600   °[facility]  336- 378-7881   °[facility] [address], [place] 1-800-853-5163 or 336-641-4981   ° °Mobile Crisis Teams °Organization         Address  Phone  Notes  °[facility], Mobile Crisis Care Unit  1-877-626-1772   °Assertive °[facility] ° [address]  [place], [place] 336-834-9664   °Sharon DeEsch [address] °[place] 336-554-5454   ° °Self-Help/Support Groups °Organization         Address  Phone             Notes  °[facility] - variety of support groups  336- 373-1402 Call for more information  °Narcotics Anonymous (NA), [facility] [address], °[place]   2 meetings at this location  ° °  Residential Treatment Programs Organization         Address  Phone  Notes  [facility] [address],    [place]  1-785-575-8451   [facility]  [address] [place] [address], [place], [place]   [place], [place] (813) [phone] Admissions: 8am-3pm M-F  [facility] [place] [place] [address],    [place], [place] 301-601-0932   [facility] [address], [place], [place]   [facility] [address],  [place], [place]   [facility] Intensive Outpatient [address], [place], [place]   [facility] [address]  [place], [place] 1-(340)457-4283 or 231-523-5778   [facility] [address], [place], [place] Accepts Medicaid  [facility] [address]  [place] 1-502-573-5918 Substance Abuse/Addiction Treatment   [facility]         Address  Phone  Notes  [facility]  910-657-6288   Domenic Schwab, PhD [address] [place], [place]   508-433-7705 or 941-695-3393   [place] [place] [employer], [place] [phone]   [facility] [address] [place], [place] [phone] Insurance/Medicaid/sponsorship through [facility] and Families [address], [address] [place]                                    [place], [place] [phone] [place] [address], [place] [phone]    Dr. Adele Schilder  [phone]   [facility] Dept. 1) [address]  2) [place] 3)  [address], [place] [phone]  [phone]   [place] 773-074-6404 or [phone] (After Hours)      Take your usual prescriptions as previously directed. Avoid full strength juices, as well as milk and milk products until your diarrhea has resolved.   Call your regular medical doctor and your GI doctor tomorrow morning to schedule a follow up appointment this week.  Return to the Emergency Department immediately if not improving (or even worsening), any black or bloody stool or vomit, if you develop a fever over "101," or for any other concerns.

## 2014-04-10 NOTE — ED Notes (Signed)
Patient c/o diarrhea x1 week. Reports it is yellow in color, and burns when she has a BM. Patient states it is mucous-like and anything she eats goes through her immediately. Patient c/o generalized abd pain. Patient has applied heat to her abd and states this does help some. Patient reports a 7 pounds weight loss in past week. Patient denies any recent antibiotic treatments.

## 2014-04-11 ENCOUNTER — Telehealth (HOSPITAL_BASED_OUTPATIENT_CLINIC_OR_DEPARTMENT_OTHER): Payer: Self-pay | Admitting: Emergency Medicine

## 2014-04-11 LAB — GI PATHOGEN PANEL BY PCR, STOOL
C difficile toxin A/B: POSITIVE
Campylobacter by PCR: NEGATIVE
Cryptosporidium by PCR: NEGATIVE
E COLI (STEC): NEGATIVE
E COLI 0157 BY PCR: NEGATIVE
E coli (ETEC) LT/ST: NEGATIVE
G lamblia by PCR: NEGATIVE
Norovirus GI/GII: NEGATIVE
Rotavirus A by PCR: NEGATIVE
SHIGELLA BY PCR: NEGATIVE
Salmonella by PCR: NEGATIVE

## 2014-04-11 LAB — CLOSTRIDIUM DIFFICILE BY PCR: Toxigenic C. Difficile by PCR: POSITIVE — AB

## 2014-04-11 NOTE — Telephone Encounter (Signed)
Post ED Visit - Positive Culture Follow-up: Successful Patient Follow-Up  Culture assessed and recommendations reviewed by: []  Wes Barry, Pharm.D., BCPS []  Heide Guile, Pharm.D., BCPS []  Alycia Rossetti, Pharm.D., BCPS []  [place], Pharm.D., BCPS, AAHIVP []  Legrand Como, Pharm.D., BCPS, AAHIVP []  Hassie Bruce, Pharm.D. []  Cassie Nicole [place], [place].D.  Positive  C. Diff culture  [x]  Patient discharged without antimicrobial prescription and treatment is now indicated []  Organism is resistant to prescribed ED discharge antimicrobial []  Patient with positive blood cultures  Changes discussed with ED provider: Heriberto Antigua antibiotic prescription flagyl 500mg  po tid x one week [facility] to [facility] [address] [phone]  Contacted patient, date 04/11/2014 , time Christopher 04/11/2014, 3:00 PM

## 2014-04-11 NOTE — Telephone Encounter (Signed)
Post ED Visit - Positive Culture Follow-up: Chart Hand-off to ED Flow Manager  Culture assessed and recommendations reviewed by: []  Wes [place], Pharm.D., BCPS []  Heide Guile, Pharm.D., BCPS []  Alycia Rossetti, Pharm.D., BCPS []  [place], Pharm.D., BCPS, AAHIVP []  Legrand Como, Pharm .D., BCPS, AAHIVP []  Hassie Bruce, Pharm.D. []  Milus Glazier, Pharm.D.  Positive C Diff culture  [x]  Patient discharged without antimicrobial prescription and treatment is now indicated []  Organism is resistant to prescribed ED discharge antimicrobial []  Patient with positive blood cultures  Changes discussed with ED provider:  Sent to [facility] 04/11/14              New antibiotic prescription    Hazle Nordmann 04/11/2014, 11:14 AM

## 2014-04-22 DIAGNOSIS — I10 Essential (primary) hypertension: Secondary | ICD-10-CM | POA: Diagnosis not present

## 2014-04-22 DIAGNOSIS — Z09 Encounter for follow-up examination after completed treatment for conditions other than malignant neoplasm: Secondary | ICD-10-CM | POA: Diagnosis not present

## 2014-04-22 DIAGNOSIS — Z23 Encounter for immunization: Secondary | ICD-10-CM | POA: Diagnosis not present

## 2014-04-22 DIAGNOSIS — R197 Diarrhea, unspecified: Secondary | ICD-10-CM | POA: Diagnosis not present

## 2014-04-25 ENCOUNTER — Emergency Department (HOSPITAL_COMMUNITY)
Admission: EM | Admit: 2014-04-25 | Discharge: 2014-04-25 | Disposition: A | Discharge status: Home / Self Care | Payer: Medicare Other | Attending: Emergency Medicine | Admitting: Emergency Medicine

## 2014-04-25 ENCOUNTER — Encounter (HOSPITAL_COMMUNITY): Payer: Self-pay | Admitting: Emergency Medicine

## 2014-04-25 DIAGNOSIS — R197 Diarrhea, unspecified: Secondary | ICD-10-CM | POA: Diagnosis not present

## 2014-04-25 DIAGNOSIS — A0472 Enterocolitis due to Clostridium difficile, not specified as recurrent: Secondary | ICD-10-CM | POA: Diagnosis not present

## 2014-04-25 DIAGNOSIS — Z79899 Other long term (current) drug therapy: Secondary | ICD-10-CM | POA: Diagnosis not present

## 2014-04-25 DIAGNOSIS — Z8639 Personal history of other endocrine, nutritional and metabolic disease: Secondary | ICD-10-CM | POA: Diagnosis not present

## 2014-04-25 DIAGNOSIS — Z862 Personal history of diseases of the blood and blood-forming organs and certain disorders involving the immune mechanism: Secondary | ICD-10-CM | POA: Insufficient documentation

## 2014-04-25 DIAGNOSIS — Z880 Allergy status to penicillin: Secondary | ICD-10-CM | POA: Insufficient documentation

## 2014-04-25 DIAGNOSIS — K219 Gastro-esophageal reflux disease without esophagitis: Secondary | ICD-10-CM | POA: Insufficient documentation

## 2014-04-25 DIAGNOSIS — I10 Essential (primary) hypertension: Secondary | ICD-10-CM | POA: Diagnosis not present

## 2014-04-25 DIAGNOSIS — Z8542 Personal history of malignant neoplasm of other parts of uterus: Secondary | ICD-10-CM | POA: Diagnosis not present

## 2014-04-25 MED ORDER — ONDANSETRON 4 MG PO TBDP: 4.0000 mg | ORAL_TABLET | Freq: Three times a day (TID) | ORAL | Status: DC | PRN

## 2014-04-25 MED ORDER — DIPHENOXYLATE-ATROPINE 2.5-0.025 MG PO TABS
2.0000 | ORAL_TABLET | Freq: Once | ORAL | Status: AC
  Administered 2014-04-25: 2 via ORAL
  Filled 2014-04-25: qty 2

## 2014-04-25 MED ORDER — DIPHENOXYLATE-ATROPINE 2.5-0.025 MG PO TABS: 1.0000 | ORAL_TABLET | Freq: Four times a day (QID) | ORAL | Status: DC | PRN

## 2014-04-25 MED ORDER — ONDANSETRON HCL 4 MG/2ML IJ SOLN
4.0000 mg | Freq: Once | INTRAMUSCULAR | Status: AC
  Administered 2014-04-25: 4 mg via INTRAVENOUS
  Filled 2014-04-25: qty 2

## 2014-04-25 MED ORDER — SODIUM CHLORIDE 0.9 % IV BOLUS (SEPSIS)
1000.0000 mL | Freq: Once | INTRAVENOUS | Status: AC
  Administered 2014-04-25: 1000 mL via INTRAVENOUS

## 2014-04-25 NOTE — ED Notes (Signed)
Pt states that she was seen here several weeks ago and was diagnosed with c diff and been taking antibiotics but states that she not able to take them anymore.  Pt sates that she has had four rounds of mucous diarrhea this morning.  Pt states that she is unable to get in with GI.

## 2014-04-25 NOTE — ED Provider Notes (Signed)
CSN: [id number]     Arrival date & time 04/25/14  1057 History   First MD Initiated Contact with Patient 04/25/14 1100     Chief Complaint  Patient presents with  . c diff      HPI  She presents with diarrhea. Diagnosed on the ninth with C. difficile enterocolitis. Receive metronidazole 3 times a day. Has had a lot of difficulty swallowing this caused her to gag and feel comfortable and be nauseated. Only one episode of vomiting. States she did "tough it out" and finished her medications. She felt well for 3-4 days. Recurrent symptoms with diarrhea and abdominal cramping 2 days ago. Nothing blood. Not lightheaded dizzy or presyncopal. No fever.  Past Medical History  Diagnosis Date  . Hypertension   . GERD (gastroesophageal reflux disease)   . Endometrial adenocarcinoma     Stage I  . Urinary incontinence   . Chest pain     2D ECHO, 11/24/2010 - EF >55%, NUCLEAR STRESS TEST, 11/24/2010 - normal, no EKG changes for ischemia  . Claudication     LEA DUPLEX, 12/15/2010 - normal scan  . Hyperlipidemia    Past Surgical History  Procedure Laterality Date  . Carpal tunnel release    . Laparoscopic assisted vaginal hysterectomy  12/2006    BSO with anterior colporrhaphy  . Bladder suspension     Family History  Problem Relation Age of Onset  . Heart attack Father     Massive  . Heart attack Brother     Massive  . Kidney disease Maternal Grandmother   . Heart attack Maternal Grandfather   . Heart attack Paternal Grandfather    History  Substance Use Topics  . Smoking status: Never Smoker   . Smokeless tobacco: Not on file  . Alcohol Use: No   OB History   Grav Para Term Preterm Abortions TAB SAB Ect Mult Living                 Review of Systems  Constitutional: Negative for fever, chills, diaphoresis, appetite change and fatigue.  HENT: Negative for mouth sores, sore throat and trouble swallowing.   Eyes: Negative for visual disturbance.  Respiratory: Negative for cough,  chest tightness, shortness of breath and wheezing.   Cardiovascular: Negative for chest pain.  Gastrointestinal: Positive for nausea, abdominal pain and diarrhea. Negative for vomiting and abdominal distention.  Endocrine: Negative for polydipsia, polyphagia and polyuria.  Genitourinary: Negative for dysuria, frequency and hematuria.  Musculoskeletal: Negative for gait problem.  Skin: Negative for color change, pallor and rash.  Neurological: Negative for dizziness, syncope, light-headedness and headaches.  Hematological: Does not bruise/bleed easily.  Psychiatric/Behavioral: Negative for behavioral problems and confusion.      Allergies  Penicillins and Simvastatin  Home Medications   Prior to Admission medications   Medication Sig Start Date End Date Taking? Authorizing Provider  bismuth subsalicylate (KAOPECTATE) 262 MG/15ML suspension Take 30 mLs by mouth every 6 (six) hours as needed for diarrhea or loose stools.    Historical Provider, MD  bismuth subsalicylate (PEPTO BISMOL) 262 MG/15ML suspension Take 30 mLs by mouth every 6 (six) hours as needed for diarrhea or loose stools.    Historical Provider, MD  diphenoxylate-atropine (LOMOTIL) 2.5-0.025 MG per tablet Take 1 tablet by mouth 4 (four) times daily as needed for diarrhea or loose stools. 04/25/14   Tanna Furry, MD  omeprazole (PRILOSEC) 20 MG capsule Take 20 mg by mouth daily.    Historical Provider, MD  ondansetron (ZOFRAN ODT) 4 MG disintegrating tablet Take 1 tablet (4 mg total) by mouth every 8 (eight) hours as needed for nausea. 04/25/14   Tanna Furry, MD   BP 155/76  Pulse 81  Temp(Src) 99.1 F (37.3 C) (Oral)  Resp 14  SpO2 99% Physical Exam  Constitutional: She is oriented to person, place, and time. She appears well-developed and well-nourished. No distress.  HENT:  Head: Normocephalic.  Eyes: Conjunctivae are normal. Pupils are equal, round, and reactive to light. No scleral icterus.  Neck: Normal range of  motion. Neck supple. No thyromegaly present.  Cardiovascular: Normal rate and regular rhythm.  Exam reveals no gallop and no friction rub.   No murmur heard. Pulmonary/Chest: Effort normal and breath sounds normal. No respiratory distress. She has no wheezes. She has no rales.  Abdominal: Soft. Bowel sounds are normal. She exhibits no distension. There is no tenderness. There is no rebound.  Abdomen is soft and benign prostatic hypertrophy masses. No focal tenderness.  Musculoskeletal: Normal range of motion.  Neurological: She is alert and oriented to person, place, and time.  Skin: Skin is warm and dry. No rash noted.  Psychiatric: She has a normal mood and affect. Her behavior is normal.    ED Course  Procedures (including critical care time) Labs Review Labs Reviewed  CBC WITH DIFFERENTIAL  BASIC METABOLIC PANEL  GI PATHOGEN PANEL BY PCR, STOOL    Imaging Review No results found.   EKG Interpretation None      MDM   Final diagnoses:  Clostridium difficile enterocolitis    Pt with only 1 episode of diarrhea here.  And politely declines additional lab draws after 2 episodes were unsuccessful. I attempted to draw from her IV and this was not accessible. I think this is appropriate. The common to her that I cannot insure her renal function without lab draw. She has no history of renal insufficiency. She was acceptable of this. Plan will be home, Lomotil, Zofran, by mouth vancomycin. Primary care followup. Recheck if any worsening symptoms.    Tanna Furry, MD 04/25/14 1438

## 2014-04-25 NOTE — Discharge Instructions (Signed)
Clostridium Difficile Infection °Clostridium difficile (C. difficile) is a germ found in the intestines. C. difficile infection can occur after taking some medicines. C. difficile infection can cause watery poop (diarrhea) or severe disease. °HOME CARE °· Drink enough fluids to keep your pee (urine) clear or pale yellow. Avoid milk, caffeine, and alcohol. °· Ask your doctor how to replace body fluid losses (rehydrate). °· Eat small meals more often rather than large meals. °· Take your medicine (antibiotics) as told. Finish it even if you start to feel better. °· Do not  use medicines to slow the watery poop. °· Wash your hands well after using the bathroom and before preparing food. °· Make sure people who live with you wash their hands often. °· Clean all surfaces. Use a product that contains chlorine bleach. °GET HELP RIGHT AWAY IF:  °· The watery poop does not stop, or it comes back after you finish your medicine. °· You feel very dry or thirsty (dehydrated). °· You have a fever. °· You have more belly (abdominal) pain or tenderness. °· There is blood in your poop (stool), or your poop is black and tar-like. °· You cannot eat food or drink liquids without throwing up (vomiting). °MAKE SURE YOU: °· Understand these instructions. °· Will watch your condition. °· Will get help right away if you are not doing well or get worse. °Document Released: 05/16/2009 Document Revised: 12/03/2013 Document Reviewed: 12/25/2010 °ExitCare® Patient Information ©2015 [employer]. This information is not intended to replace advice given to you by your health care provider. Make sure you discuss any questions you have with your health care provider. ° °

## 2014-04-25 NOTE — ED Notes (Signed)
Went to collect labs - Pt doesn't want to be stuck.  Was told by another RN that they can try to pull from her line ( wouldn't work initially).  Dr Jeneen Rinks aware - told us to let fluids run until they are gone. Then try to pull off line.  RN aware and will let me know if i need to get labs.

## 2014-04-25 NOTE — ED Notes (Signed)
Attempt made to draw blood out of PIV. Unsuccessful with obtaining a useful amount of blood. Pt refusing to be stuck again to obtain lab work. Jeneen Rinks, MD notified.

## 2014-04-29 LAB — GI PATHOGEN PANEL BY PCR, STOOL
C difficile toxin A/B: POSITIVE
Campylobacter by PCR: NEGATIVE
Cryptosporidium by PCR: NEGATIVE
E COLI 0157 BY PCR: NEGATIVE
E coli (ETEC) LT/ST: NEGATIVE
E coli (STEC): NEGATIVE
G lamblia by PCR: NEGATIVE
Norovirus GI/GII: NEGATIVE
Rotavirus A by PCR: NEGATIVE
SALMONELLA BY PCR: NEGATIVE
Shigella by PCR: NEGATIVE

## 2014-12-24 ENCOUNTER — Encounter: Payer: Self-pay | Admitting: Cardiovascular Disease

## 2015-01-17 ENCOUNTER — Ambulatory Visit: Payer: Medicare Other | Admitting: Cardiovascular Disease

## 2015-01-29 ENCOUNTER — Ambulatory Visit (INDEPENDENT_AMBULATORY_CARE_PROVIDER_SITE_OTHER): Payer: Medicare Other | Admitting: Cardiovascular Disease

## 2015-01-29 ENCOUNTER — Other Ambulatory Visit: Payer: Self-pay | Admitting: Pharmacist Clinician (PhC)/ Clinical Pharmacy Specialist

## 2015-01-29 ENCOUNTER — Encounter: Payer: Self-pay | Admitting: Cardiovascular Disease

## 2015-01-29 VITALS — BP 130/82 | HR 60 | Ht 62.0 in | Wt 180.7 lb

## 2015-01-29 DIAGNOSIS — I480 Paroxysmal atrial fibrillation: Secondary | ICD-10-CM

## 2015-01-29 DIAGNOSIS — G4733 Obstructive sleep apnea (adult) (pediatric): Secondary | ICD-10-CM | POA: Diagnosis not present

## 2015-01-29 DIAGNOSIS — E785 Hyperlipidemia, unspecified: Secondary | ICD-10-CM

## 2015-01-29 DIAGNOSIS — I10 Essential (primary) hypertension: Secondary | ICD-10-CM

## 2015-01-29 MED ORDER — OMEPRAZOLE 20 MG PO CPDR: 20.0000 mg | DELAYED_RELEASE_CAPSULE | Freq: Every day | ORAL | Status: DC

## 2015-01-29 MED ORDER — LISINOPRIL-HYDROCHLOROTHIAZIDE 20-12.5 MG PO TABS: 1.0000 | ORAL_TABLET | Freq: Every day | ORAL | Status: DC

## 2015-01-29 MED ORDER — METOPROLOL SUCCINATE ER 100 MG PO TB24: 100.0000 mg | ORAL_TABLET | Freq: Every day | ORAL | Status: DC

## 2015-01-29 NOTE — Patient Instructions (Signed)
Dr Berry recommends that you schedule a follow-up appointment in 1 year. You will receive a reminder letter in the mail two months in advance. If you don't receive a letter, please call our office to schedule the follow-up appointment. 

## 2015-01-29 NOTE — Telephone Encounter (Signed)
Labs for lipid

## 2015-01-29 NOTE — Assessment & Plan Note (Signed)
History of hyperlipidemia with statin intolerance. We will have her see Cyril Mourning to discuss PCS K9 monoclonal injectable therapy

## 2015-01-29 NOTE — Assessment & Plan Note (Signed)
History of obstructive  sleep apnea however she never pursued C Pap

## 2015-01-29 NOTE — Assessment & Plan Note (Signed)
History of hypertension with blood pressure measures 130/82. She is on lisinopril and hydrochlorothiazide. As well as metoprolol. Continue current meds at current dosing

## 2015-01-29 NOTE — Progress Notes (Signed)
01/29/2015 Donna Sloan   04-Nov-1940  [id number]  Primary Physician Stephens Shire, MD Primary Cardiologist: Donna Harp MD Donna Sloan   HPI:  The patient is a 74 year old, moderately overweight, married, Caucasian female mother of 2, grandmother to 2 grandchildren whose husband is also a patient of mine and who is accompanying her today. I last saw her 2 years ago. She has a history of PAF, improved on low-dose beta blocker. Her other problems include obstructive sleep apnea; she never pursued a CPAP titration study after her initial diagnostic tests. She has hypertension and hyperlipidemia as well. She also has symptoms compatible with restless leg syndrome. Lower extremity Dopplers are normal. She says that she feels that the simvastatin which I put her on causes nausea at night. Her total cholesterol a year ago was 238 with an LDL of 153 and HDL of 56. She has been asymptomatic since I saw her.  Current Outpatient Prescriptions  Medication Sig Dispense Refill  . aspirin 81 MG tablet Take 81 mg by mouth daily.    [name] lisinopril-hydrochlorothiazide (PRINZIDE,ZESTORETIC) 20-12.5 MG per tablet Take 1 tablet by mouth daily.  0  . metoprolol succinate (TOPROL-XL) 100 MG 24 hr tablet Take 1 tablet by mouth daily.  0  . omeprazole (PRILOSEC) 20 MG capsule Take 20 mg by mouth daily.     No current facility-administered medications for this visit.    Allergies  Allergen Reactions  . Penicillins   . Simvastatin Nausea And Vomiting    History   Social History  . Marital Status: Married    Spouse Name: N/A  . Number of Children: N/A  . Years of Education: N/A   Occupational History  . Not on file.   Social History Main Topics  . Smoking status: Never Smoker   . Smokeless tobacco: Not on file  . Alcohol Use: No  . Drug Use: No  . Sexual Activity: Not on file   Other Topics Concern  . Not on file   Social History Narrative     Review of  Systems: General: negative for chills, fever, night sweats or weight changes.  Cardiovascular: negative for chest pain, dyspnea on exertion, edema, orthopnea, palpitations, paroxysmal nocturnal dyspnea or shortness of breath Dermatological: negative for rash Respiratory: negative for cough or wheezing Urologic: negative for hematuria Abdominal: negative for nausea, vomiting, diarrhea, bright red blood per rectum, melena, or hematemesis Neurologic: negative for visual changes, syncope, or dizziness All other systems reviewed and are otherwise negative except as noted above.    Blood pressure 130/82, pulse 60, height 5\' 2"  (1.575 m), weight 180 lb 11.2 oz (81.965 kg).  General appearance: alert and no distress Neck: no adenopathy, no carotid bruit, no JVD, supple, symmetrical, trachea midline and thyroid not enlarged, symmetric, no tenderness/mass/nodules Lungs: clear to auscultation bilaterally Heart: regular rate and rhythm, S1, S2 normal, no murmur, click, rub or gallop Extremities: extremities normal, atraumatic, no cyanosis or edema  EKG normal sinus rhythm at 60 with nonspecific ST and T-wave changes. I personally reviewed this EKG  ASSESSMENT AND PLAN:   PAF (paroxysmal atrial fibrillation) History of PAF. She's had no episodes since she was seen last. She is currently in sinus rhythm not on an oral anticoagulant  Essential hypertension History of hypertension with blood pressure measures 130/82. She is on lisinopril and hydrochlorothiazide. As well as metoprolol. Continue current meds at current dosing  Hyperlipidemia History of hyperlipidemia with statin intolerance. We will have her see  Kristen to discuss PCS K9 monoclonal injectable therapy  Obstructive sleep apnea History of obstructive  sleep apnea however she never pursued C Pap      Donna Harp MD [facility], [employer] 01/29/2015 9:44 AM

## 2015-01-29 NOTE — Assessment & Plan Note (Signed)
History of PAF. She's had no episodes since she was seen last. She is currently in sinus rhythm not on an oral anticoagulant

## 2015-02-07 ENCOUNTER — Other Ambulatory Visit: Payer: Self-pay | Admitting: Cardiovascular Disease

## 2015-02-07 DIAGNOSIS — E785 Hyperlipidemia, unspecified: Secondary | ICD-10-CM | POA: Diagnosis not present

## 2015-02-07 LAB — HEPATIC FUNCTION PANEL
ALK PHOS: 96 U/L (ref 39–117)
ALT: 31 U/L (ref 0–35)
AST: 38 U/L — AB (ref 0–37)
Albumin: 3.8 g/dL (ref 3.5–5.2)
BILIRUBIN INDIRECT: 0.4 mg/dL (ref 0.2–1.2)
Bilirubin, Direct: 0.1 mg/dL (ref 0.0–0.3)
TOTAL PROTEIN: 7 g/dL (ref 6.0–8.3)
Total Bilirubin: 0.5 mg/dL (ref 0.2–1.2)

## 2015-02-07 LAB — LIPID PANEL
CHOL/HDL RATIO: 4.2 ratio
CHOLESTEROL: 229 mg/dL — AB (ref 0–200)
HDL: 55 mg/dL (ref >=46)
LDL Cholesterol: 149 mg/dL — ABNORMAL HIGH (ref 0–99)
Triglycerides: 125 mg/dL (ref <150)
VLDL: 25 mg/dL (ref 0–40)

## 2015-02-11 ENCOUNTER — Encounter: Payer: Self-pay | Admitting: *Deleted

## 2015-02-11 ENCOUNTER — Telehealth: Payer: Self-pay | Admitting: Cardiovascular Disease

## 2015-02-11 NOTE — Telephone Encounter (Signed)
Returning your call from yesterday. °

## 2015-02-11 NOTE — Telephone Encounter (Signed)
Left message for patient to contact me.

## 2015-02-12 DIAGNOSIS — Z1231 Encounter for screening mammogram for malignant neoplasm of breast: Secondary | ICD-10-CM | POA: Diagnosis not present

## 2015-02-13 ENCOUNTER — Ambulatory Visit (INDEPENDENT_AMBULATORY_CARE_PROVIDER_SITE_OTHER): Payer: Medicare Other | Admitting: Pharmacist Clinician (PhC)/ Clinical Pharmacy Specialist

## 2015-02-13 VITALS — Ht 62.0 in | Wt 180.0 lb

## 2015-02-13 DIAGNOSIS — E785 Hyperlipidemia, unspecified: Secondary | ICD-10-CM | POA: Diagnosis not present

## 2015-02-13 NOTE — Patient Instructions (Signed)
It was nice to meet you.  Start on samples of Zetia 10 mg once daily.  Does not matter if it is with or without food.  Call in about 3 weeks to let us know how you are doing with it.  We can then call it in to your pharmacy.  If you have problems, call the office at [phone], ask for Dr. Kennon Holter nurse or Erasmo Downer.  We will repeat labs in September.

## 2015-02-13 NOTE — Telephone Encounter (Signed)
Spoke with patient and reviewed lab results. 

## 2015-02-17 ENCOUNTER — Encounter: Payer: Self-pay | Admitting: Pharmacist Clinician (PhC)/ Clinical Pharmacy Specialist

## 2015-02-17 MED ORDER — EZETIMIBE 10 MG PO TABS: 10.0000 mg | ORAL_TABLET | Freq: Every day | ORAL | Status: DC

## 2015-02-17 NOTE — Assessment & Plan Note (Addendum)
Pt with elevated LDL with no ASCVD, goal is primary prevention.  She reports N/V with both simvastatin and rosuvastatin, does not wish to try another statin at this time.  Does not recall ever having tried Zetia.  Will not qualify for a PCSK-9 at this time, due to lack of ASCVD or familial HL.  Plan today is to start her on Zetia 10 mg once daily and repeat labs in 3 months.   Encouraged her to cut the number of white foods in her diet and increase fiber and vegetables.

## 2015-02-17 NOTE — Progress Notes (Signed)
02/17/2015 Donna Sloan 1940-10-22 [id number]   HPI:  Donna Sloan is a 74 y.o. female patient of Dr Gwenlyn Found, who presents today for a lipid clinic evaluation.   RF:  HTN, OSA  Meds: none at this time   Intolerant: simvastatin and rosuvastatin both caused N/V  Family history:  Significant in that both father and brother died from MI at age 23, mother had MI at age 10, lived to 81  Diet: skips breakfast, enjoys vegetables and white foods, eats out daily  Exercise: none, has problems with restless legs and varicose veins    Labs:   Results for Donna, Sloan (MRN [id number]) as of 02/17/2015 14:59  Ref. Range 02/07/2015 11:07  Cholesterol Latest Ref Range: 0-200 mg/dL 229 (H)  Triglycerides Latest Ref Range: <150 mg/dL 125  HDL Cholesterol Latest Ref Range: >=46 mg/dL 55  LDL (calc) Latest Ref Range: 0-99 mg/dL 149 (H)  VLDL Latest Ref Range: 0-40 mg/dL 25  Total CHOL/HDL Ratio Latest Units: Ratio 4.2    Current Outpatient Prescriptions  Medication Sig Dispense Refill  . aspirin 81 MG tablet Take 81 mg by mouth daily.    [name] lisinopril-hydrochlorothiazide (PRINZIDE,ZESTORETIC) 20-12.5 MG per tablet Take 1 tablet by mouth daily. 30 tablet 11  . metoprolol succinate (TOPROL-XL) 100 MG 24 hr tablet Take 1 tablet (100 mg total) by mouth daily. 30 tablet 11  . omeprazole (PRILOSEC) 20 MG capsule Take 1 capsule (20 mg total) by mouth daily. 90 capsule 3   No current facility-administered medications for this visit.    Allergies  Allergen Reactions  . Penicillins   . Rosuvastatin Nausea And Vomiting  . Simvastatin Nausea And Vomiting    Past Medical History  Diagnosis Date  . Hypertension   . GERD (gastroesophageal reflux disease)   . Endometrial adenocarcinoma     Stage I  . Urinary incontinence   . Chest pain     2D ECHO, 11/24/2010 - EF >55%, NUCLEAR STRESS TEST, 11/24/2010 - normal, no EKG changes for ischemia  . Claudication     LEA DUPLEX, 12/15/2010 - normal scan   . Hyperlipidemia     statin intolerant    Height 5\' 2"  (1.575 m), weight 180 lb (81.647 kg).     Tommy Medal PharmD CPP [facility]

## 2015-03-20 DIAGNOSIS — H524 Presbyopia: Secondary | ICD-10-CM | POA: Diagnosis not present

## 2015-03-20 DIAGNOSIS — H26492 Other secondary cataract, left eye: Secondary | ICD-10-CM | POA: Diagnosis not present

## 2015-03-20 DIAGNOSIS — H35373 Puckering of macula, bilateral: Secondary | ICD-10-CM | POA: Diagnosis not present

## 2015-05-05 DIAGNOSIS — Z23 Encounter for immunization: Secondary | ICD-10-CM | POA: Diagnosis not present

## 2015-09-26 DIAGNOSIS — M5412 Radiculopathy, cervical region: Secondary | ICD-10-CM | POA: Diagnosis not present

## 2015-09-26 DIAGNOSIS — I10 Essential (primary) hypertension: Secondary | ICD-10-CM | POA: Diagnosis not present

## 2015-09-26 DIAGNOSIS — M50322 Other cervical disc degeneration at C5-C6 level: Secondary | ICD-10-CM | POA: Diagnosis not present

## 2015-09-26 DIAGNOSIS — M79602 Pain in left arm: Secondary | ICD-10-CM | POA: Diagnosis not present

## 2015-09-26 DIAGNOSIS — M47812 Spondylosis without myelopathy or radiculopathy, cervical region: Secondary | ICD-10-CM | POA: Diagnosis not present

## 2015-09-26 DIAGNOSIS — C55 Malignant neoplasm of uterus, part unspecified: Secondary | ICD-10-CM | POA: Diagnosis not present

## 2015-11-03 DIAGNOSIS — H52221 Regular astigmatism, right eye: Secondary | ICD-10-CM | POA: Diagnosis not present

## 2015-11-03 DIAGNOSIS — H5201 Hypermetropia, right eye: Secondary | ICD-10-CM | POA: Diagnosis not present

## 2015-11-03 DIAGNOSIS — H524 Presbyopia: Secondary | ICD-10-CM | POA: Diagnosis not present

## 2015-11-03 DIAGNOSIS — H43813 Vitreous degeneration, bilateral: Secondary | ICD-10-CM | POA: Diagnosis not present

## 2015-11-03 DIAGNOSIS — H5212 Myopia, left eye: Secondary | ICD-10-CM | POA: Diagnosis not present

## 2015-11-03 DIAGNOSIS — H353131 Nonexudative age-related macular degeneration, bilateral, early dry stage: Secondary | ICD-10-CM | POA: Diagnosis not present

## 2015-11-03 DIAGNOSIS — H04123 Dry eye syndrome of bilateral lacrimal glands: Secondary | ICD-10-CM | POA: Diagnosis not present

## 2015-11-10 DIAGNOSIS — H04123 Dry eye syndrome of bilateral lacrimal glands: Secondary | ICD-10-CM | POA: Diagnosis not present

## 2015-11-10 DIAGNOSIS — H10413 Chronic giant papillary conjunctivitis, bilateral: Secondary | ICD-10-CM | POA: Diagnosis not present

## 2015-11-27 ENCOUNTER — Ambulatory Visit (INDEPENDENT_AMBULATORY_CARE_PROVIDER_SITE_OTHER): Payer: Medicare Other | Admitting: Neurology

## 2015-11-27 ENCOUNTER — Encounter: Payer: Self-pay | Admitting: Neurology

## 2015-11-27 VITALS — BP 140/86 | HR 72 | Ht 62.0 in | Wt 180.5 lb

## 2015-11-27 DIAGNOSIS — R202 Paresthesia of skin: Secondary | ICD-10-CM | POA: Diagnosis not present

## 2015-11-27 DIAGNOSIS — M5412 Radiculopathy, cervical region: Secondary | ICD-10-CM

## 2015-11-27 NOTE — Patient Instructions (Signed)
1.  NCS/EMG of the left upper extremity 2.  Based on the results of your testing and if your symptoms do not improve, we can refer to physical therapy  Return to clinic in 3 months

## 2015-11-27 NOTE — Progress Notes (Signed)
[facility] Clinic Note - Initial Visit   Date: 11/27/2015  Donna Sloan MRN: [id number] DOB: 1941-07-26   Dear Dr. Tollie Pizza:  Thank you for your kind referral of Donna Sloan for consultation of left arm pain. Although her history is well known to you, please allow [place] to reiterate it for the purpose of our medical record. The patient was accompanied to the clinic by self.    History of Present Illness: Donna Sloan is a 75 y.o. right-handed Caucasian female with hypertension, GERD, and paroxysmal atrial fibrillation presenting for evaluation of left arm pain and numbness.    She and her husband live in [place] from October - March. In mid-February 2017, she was walking on the beach and developed left shoulder pain radiating down her forearm, described as throbbing pain. Pain slowly worsened and she went to the emergency department for evaluation, where she was started her on prednisone, valium, and naprosyn which alleviated the pain over a week.  She has noticed that that since the pain resolved, she began having tingling of the hand and constant numbness over the left index finger.  She also complains of soreness over the hypothenar eminance,  She endorses chronic neck pain. She does not have similar symptoms on the right.     She has had bilateral CTS release many years ago.   Past Medical History  Diagnosis Date  . Hypertension   . GERD (gastroesophageal reflux disease)   . Endometrial adenocarcinoma (HCC)     Stage I  . Urinary incontinence   . Chest pain     2D ECHO, 11/24/2010 - EF >55%, NUCLEAR STRESS TEST, 11/24/2010 - normal, no EKG changes for ischemia  . Claudication ([place])     LEA DUPLEX, 12/15/2010 - normal scan  . Hyperlipidemia     statin intolerant    Past Surgical History  Procedure Laterality Date  . Carpal tunnel release    . Laparoscopic assisted vaginal hysterectomy  12/2006    BSO with anterior colporrhaphy  .  Bladder suspension       Medications:  Outpatient Encounter Prescriptions as of 11/27/2015  Medication Sig Note  . aspirin 81 MG tablet Take 81 mg by mouth daily.   [name] ezetimibe (ZETIA) 10 MG tablet Take 1 tablet (10 mg total) by mouth daily.   [name] lisinopril-hydrochlorothiazide (PRINZIDE,ZESTORETIC) 20-12.5 MG per tablet Take 1 tablet by mouth daily.   . metoprolol succinate (TOPROL-XL) 100 MG 24 hr tablet Take 1 tablet (100 mg total) by mouth daily.   [name] omeprazole (PRILOSEC) 20 MG capsule Take 1 capsule (20 mg total) by mouth daily.   . [DISCONTINUED] diazepam (VALIUM) 5 MG tablet take 1 tablet by mouth every 8 hours if needed for SPASM 11/27/2015: Received from: External Pharmacy  . [DISCONTINUED] HYDROcodone-acetaminophen (NORCO/VICODIN) 5-325 MG tablet take 1 tablet by mouth every 4 to 6 hours if needed 11/27/2015: Received from: External Pharmacy   No facility-administered encounter medications on file as of 11/27/2015.     Allergies:  Allergies  Allergen Reactions  . Penicillins   . Rosuvastatin Nausea And Vomiting  . Simvastatin Nausea And Vomiting    Family History: Family History  Problem Relation Age of Onset  . Heart attack Father 52    Massive - deceased  . Heart attack Brother 57    Massive - deceased  . Kidney disease Maternal Grandmother   . Heart attack Maternal Grandfather   . Heart attack Paternal Grandfather  Social History: Social History  Substance Use Topics  . Smoking status: Never Smoker   . Smokeless tobacco: Never Used  . Alcohol Use: No   Social History   Social History Narrative   Lives with husband.  Has 2 sons.  Retired from [employer] working.  Education: high school.      Review of Systems:  CONSTITUTIONAL: No fevers, chills, night sweats, or weight loss.   EYES: No visual changes or eye pain ENT: No hearing changes.  No history of nose bleeds.   RESPIRATORY: No cough, wheezing and shortness of breath.   CARDIOVASCULAR: Negative for  chest pain, and palpitations.   GI: Negative for abdominal discomfort, blood in stools or black stools.  No recent change in bowel habits.   GU:  No history of incontinence.   MUSCLOSKELETAL: No history of joint pain or swelling.  No myalgias.   SKIN: Negative for lesions, rash, and itching.   HEMATOLOGY/ONCOLOGY: Negative for prolonged bleeding, bruising easily, and swollen nodes.  No history of cancer.   ENDOCRINE: Negative for cold or heat intolerance, polydipsia or goiter.   PSYCH:  No depression or anxiety symptoms.   NEURO: As Above.   Vital Signs:  BP 140/86 mmHg  Pulse 72  Ht 5\' 2"  (1.575 m)  Wt 180 lb 8 oz (81.874 kg)  BMI 33.01 kg/m2  SpO2 96% Pain Scale: 0 on a scale of 0-10   General Medical Exam:   General:  Well appearing, comfortable.   Eyes/ENT: see cranial nerve examination.   Neck: No masses appreciated.  Full range of motion without tenderness.  No carotid bruits. Respiratory:  Clear to auscultation, good air entry bilaterally.   Cardiac:  Regular rate and rhythm, no murmur.   Extremities:  No deformities, edema, or skin discoloration.  Skin:  No rashes or lesions.  Neurological Exam: MENTAL STATUS including orientation to time, place, person, recent and remote memory, attention span and concentration, language, and fund of knowledge is normal.  Speech is not dysarthric.  CRANIAL NERVES: II:  No visual field defects.  Unremarkable fundi.   III-IV-VI: Pupils equal round and reactive to light.  Normal conjugate, extra-ocular eye movements in all directions of gaze.  No nystagmus.  No ptosis.   V:  Normal facial sensation.    VII:  Normal facial symmetry and movements.   VIII:  Normal hearing and vestibular function.   IX-X:  Normal palatal movement.   XI:  Normal shoulder shrug and head rotation.   XII:  Normal tongue strength and range of motion, no deviation or fasciculation.  MOTOR:  No atrophy, fasciculations or abnormal movements.  No pronator drift.   Tone is normal.    Right Upper Extremity:    Left Upper Extremity:    Deltoid  5/5   Deltoid  5/5   Biceps  5/5   Biceps  5/5   Triceps  5/5   Triceps  5/5   Wrist extensors  5/5   Wrist extensors  5/5   Wrist flexors  5/5   Wrist flexors  5/5   Finger extensors  5/5   Finger extensors  5/5   Finger flexors  5/5   Finger flexors  5/5   Dorsal interossei  5/5   Dorsal interossei  5/5   Abductor pollicis  5/5   Abductor pollicis  5/5   Tone (Ashworth scale)  0  Tone (Ashworth scale)  0   Right Lower Extremity:    Left Lower Extremity:  Hip flexors  5/5   Hip flexors  5/5   Hip extensors  5/5   Hip extensors  5/5   Knee flexors  5/5   Knee flexors  5/5   Knee extensors  5/5   Knee extensors  5/5   Dorsiflexors  5/5   Dorsiflexors  5/5   Plantarflexors  5/5   Plantarflexors  5/5   Toe extensors  5/5   Toe extensors  5/5   Toe flexors  5/5   Toe flexors  5/5   Tone (Ashworth scale)  0  Tone (Ashworth scale)  0   MSRs:  Right                                                                 Left brachioradialis 2+  brachioradialis 2+  biceps 2+  biceps 2+  triceps 2+  triceps 2+  patellar 2+  patellar 2+  ankle jerk 2+  ankle jerk 2+  Hoffman no  Hoffman no  plantar response down  plantar response down   SENSORY:  Reduced sensation to temperature and pin prick over the distal palmer surface of the index finger on the left only.  Sensation in all other limbs intact.  COORDINATION/GAIT: Normal finger-to- nose-finger and heel-to-shin.  Intact rapid alternating movements bilaterally.  Able to rise from a chair without using arms.  Gait narrow based and stable.  She is unsteady with tandem gait.   IMPRESSION: Mrs. Fogelson is a pleasant 75 year-old female referred for evaluation of left arm pain and hand paresthesias.  Her exam shows isolated sensory loss involving the very tip of her left index finger.  Motor strength and reflexes are intact. Cervical radiculopathy involving C7 nerve root  is suspected.  Less likely CTS since she has had release previously.  NCS/EMG will be ordered to better localize her symptoms.  She does not wish to be on any medications for paresthesias since it is not bothersome.  Pending the results of her EDX and if symptoms do not improve, neck PT will be the next step.  Return to clinic in 3 months.   The duration of this appointment visit was 35 minutes of face-to-face time with the patient.  Greater than 50% of this time was spent in counseling, explanation of diagnosis, planning of further management, and coordination of care.   Thank you for allowing me to participate in patient's care.  If I can answer any additional questions, I would be pleased to do so.    Sincerely,    Remi Rester K. Posey Pronto, DO

## 2015-11-28 DIAGNOSIS — H10413 Chronic giant papillary conjunctivitis, bilateral: Secondary | ICD-10-CM | POA: Diagnosis not present

## 2015-11-28 DIAGNOSIS — H04123 Dry eye syndrome of bilateral lacrimal glands: Secondary | ICD-10-CM | POA: Diagnosis not present

## 2015-12-09 ENCOUNTER — Telehealth: Payer: Self-pay | Admitting: Neurology

## 2015-12-09 ENCOUNTER — Ambulatory Visit (INDEPENDENT_AMBULATORY_CARE_PROVIDER_SITE_OTHER): Payer: Medicare Other | Admitting: Neurology

## 2015-12-09 DIAGNOSIS — M5412 Radiculopathy, cervical region: Secondary | ICD-10-CM | POA: Diagnosis not present

## 2015-12-09 DIAGNOSIS — G5602 Carpal tunnel syndrome, left upper limb: Secondary | ICD-10-CM

## 2015-12-09 DIAGNOSIS — R202 Paresthesia of skin: Secondary | ICD-10-CM

## 2015-12-09 NOTE — Procedures (Signed)
[facility]  [place], [place]  [place], [place] [address] Tel: [phone] Fax:  [phone] Test Date:  12/09/2015  Patient: Donna Sloan DOB: [birth year] Physician: Narda Amber, DO  Sex: Female Height: 5\' 2"  Ref Phys: Narda Amber, DO  ID#: [id number] Temp: 33.1C Technician: Jerilynn Mages. Dean   Patient Complaints: This is a 75 year old female referred for evaluation of left hand pain, numbness and tingling. She has history of bilateral CTS release.  NCV & EMG Findings: Extensive electrodiagnostic testing of the left upper extremity shows: 1. Left median sensory response shows mildly prolonged latency with normal amplitude. Left ulnar sensory responses within normal limits. 2. Left median motor response shows prolonged distal onset latency (4.1 ms). Left ulnar motor responses within normal limits. 3. There is no evidence of active or chronic motor axon loss changes affecting any of the tested muscles. Motor unit configuration and recruitment pattern is normal.    Impression: 1. There is evidence of old carpal tunnel syndrome, mild in degree electrically. 2. There is no evidence of a left cervical radiculopathy.   ___________________________ Narda Amber, DO    Nerve Conduction Studies Anti Sensory Summary Table   Stim Site NR Peak (ms) Norm Peak (ms) P-T Amp (V) Norm P-T Amp  Left Median Anti Sensory (2nd Digit)  33.1C  Wrist    4.0 <3.8 22.6 >10  Left Ulnar Anti Sensory (5th Digit)  33.1C  Wrist    3.0 <3.2 26.9 >5   Motor Summary Table   Stim Site NR Onset (ms) Norm Onset (ms) O-P Amp (mV) Norm O-P Amp Site1 Site2 Delta-0 (ms) Dist (cm) Vel (m/s) Norm Vel (m/s)  Left Median Motor (Abd Poll Brev)  33.1C  Wrist    4.1 <4.0 5.5 >5 Elbow Wrist 3.6 20.0 56 >50  Elbow    7.7  5.1         Left Ulnar Motor (Abd Dig Minimi)  33.1C  Wrist    2.7 <3.1 9.4 >7 B Elbow Wrist 3.0 17.0 57 >50  B Elbow    5.7  8.7  A Elbow B Elbow 1.5 10.0 67 >50  A Elbow    7.2  8.2           EMG   Side Muscle Ins Act Fibs Psw Fasc Number Recrt Dur Dur. Amp Amp. Poly Poly. Comment  Left 1stDorInt Nml Nml Nml Nml Nml Nml Nml Nml Nml Nml Nml Nml N/A  Left Abd Poll Brev Nml Nml Nml Nml Nml Nml Nml Nml Nml Nml Nml Nml N/A  Left Ext Indicis Nml Nml Nml Nml Nml Nml Nml Nml Nml Nml Nml Nml N/A  Left PronatorTeres Nml Nml Nml Nml Nml Nml Nml Nml Nml Nml Nml Nml N/A  Left Biceps Nml Nml Nml Nml Nml Nml Nml Nml Nml Nml Nml Nml N/A  Left Triceps Nml Nml Nml Nml Nml Nml Nml Nml Nml Nml Nml Nml N/A  Left Deltoid Nml Nml Nml Nml Nml Nml Nml Nml Nml Nml Nml Nml N/A      Waveforms:

## 2015-12-09 NOTE — Telephone Encounter (Signed)
Results of EMG discussed with patient which shows residuals of old left CTS, there is no cervical radiculopathy. She has already had CTS release and reports symptoms are not very bothersome. Her radicular pain has now resolved.  She will contact my office if symptoms worsen.  Erion Weightman K. Posey Pronto, DO

## 2016-01-27 ENCOUNTER — Ambulatory Visit: Payer: Medicare Other | Admitting: Cardiovascular Disease

## 2016-02-05 DIAGNOSIS — H0011 Chalazion right upper eyelid: Secondary | ICD-10-CM | POA: Diagnosis not present

## 2016-02-20 ENCOUNTER — Ambulatory Visit (INDEPENDENT_AMBULATORY_CARE_PROVIDER_SITE_OTHER): Payer: Medicare Other | Admitting: Cardiovascular Disease

## 2016-02-20 ENCOUNTER — Encounter: Payer: Self-pay | Admitting: Cardiovascular Disease

## 2016-02-20 VITALS — BP 120/72 | HR 66 | Ht 62.0 in | Wt 176.0 lb

## 2016-02-20 DIAGNOSIS — I480 Paroxysmal atrial fibrillation: Secondary | ICD-10-CM | POA: Diagnosis not present

## 2016-02-20 DIAGNOSIS — I10 Essential (primary) hypertension: Secondary | ICD-10-CM

## 2016-02-20 DIAGNOSIS — G4733 Obstructive sleep apnea (adult) (pediatric): Secondary | ICD-10-CM

## 2016-02-20 DIAGNOSIS — E785 Hyperlipidemia, unspecified: Secondary | ICD-10-CM

## 2016-02-20 NOTE — Assessment & Plan Note (Signed)
History of hyperlipidemia intolerant to statin drugs 

## 2016-02-20 NOTE — Progress Notes (Signed)
02/20/2016 Donna Sloan   [birth year]  [id number]  Primary Physician Stephens Shire, MD Primary Cardiologist: Lorretta Harp MD Renae Gloss  HPI:  The patient is a 75 year old, moderately overweight, married, Caucasian female mother of 2, grandmother to 2 grandchildren whose husband is also a patient of mine and who is accompanying her today. I last saw her 01/29/15. She has a history of PAF, improved on low-dose beta blocker. Her other problems include obstructive sleep apnea; she never pursued a CPAP titration study after her initial diagnostic tests. She has hypertension and hyperlipidemia as well. She also has symptoms compatible with restless leg syndrome. Lower extremity Dopplers are normal. She says that she feels that the simvastatin which I put her on causes nausea at night She has been asymptomatic since I saw her.   Current Outpatient Prescriptions  Medication Sig Dispense Refill  . aspirin 81 MG tablet Take 81 mg by mouth daily.    [name] lisinopril-hydrochlorothiazide (PRINZIDE,ZESTORETIC) 20-12.5 MG per tablet Take 1 tablet by mouth daily. 30 tablet 11  . metoprolol succinate (TOPROL-XL) 100 MG 24 hr tablet Take 1 tablet (100 mg total) by mouth daily. 30 tablet 11  . Multiple Vitamins-Minerals (ICAPS AREDS 2) CAPS Take 1 capsule by mouth daily.    [name] omeprazole (PRILOSEC) 20 MG capsule Take 1 capsule (20 mg total) by mouth daily. 90 capsule 3   No current facility-administered medications for this visit.    Allergies  Allergen Reactions  . Penicillins   . Rosuvastatin Nausea And Vomiting  . Simvastatin Nausea And Vomiting    Social History   Social History  . Marital Status: Married    Spouse Name: N/A  . Number of Children: N/A  . Years of Education: N/A   Occupational History  . Not on file.   Social History Main Topics  . Smoking status: Never Smoker   . Smokeless tobacco: Never Used  . Alcohol Use: No  . Drug Use: No  . Sexual Activity:  Not on file   Other Topics Concern  . Not on file   Social History Narrative   Lives with husband.  Has 2 sons.  Retired from [employer] working.  Education: high school.       Review of Systems: General: negative for chills, fever, night sweats or weight changes.  Cardiovascular: negative for chest pain, dyspnea on exertion, edema, orthopnea, palpitations, paroxysmal nocturnal dyspnea or shortness of breath Dermatological: negative for rash Respiratory: negative for cough or wheezing Urologic: negative for hematuria Abdominal: negative for nausea, vomiting, diarrhea, bright red blood per rectum, melena, or hematemesis Neurologic: negative for visual changes, syncope, or dizziness All other systems reviewed and are otherwise negative except as noted above.    Blood pressure 120/72, pulse 66, height 5\' 2"  (1.575 m), weight 176 lb (79.833 kg), SpO2 96 %.  General appearance: alert and no distress Neck: no adenopathy, no carotid bruit, no JVD, supple, symmetrical, trachea midline and thyroid not enlarged, symmetric, no tenderness/mass/nodules Lungs: clear to auscultation bilaterally Heart: regular rate and rhythm, S1, S2 normal, no murmur, click, rub or gallop Extremities: extremities normal, atraumatic, no cyanosis or edema  EKG sinus rhythm at 66 with nonspecific ST and T-wave changes. I personally reviewed this EKG  ASSESSMENT AND PLAN:   PAF (paroxysmal atrial fibrillation) History of paroxysmal mitral fibrillation with no episodes since she was seen last. She currently is in sinus rhythm not on oral anticoagulants  Essential hypertension History of hypertension  blood pressure is 120/72. She is on lisinopril, hydrochlorothiazide and metoprolol. Continue current meds at current dose  Hyperlipidemia History of hyperlipidemia intolerant to statin drugs  Obstructive sleep apnea History of symptoms compatible with obstructive sleep apnea although she never pursued outpatient sleep  study      Lorretta Harp MD [facility] 02/20/2016 3:40 PM

## 2016-02-20 NOTE — Assessment & Plan Note (Signed)
History of symptoms compatible with obstructive sleep apnea although she never pursued outpatient sleep study

## 2016-02-20 NOTE — Assessment & Plan Note (Signed)
History of hypertension blood pressure is 120/72. She is on lisinopril, hydrochlorothiazide and metoprolol. Continue current meds at current dose

## 2016-02-20 NOTE — Patient Instructions (Signed)

## 2016-02-20 NOTE — Assessment & Plan Note (Signed)
History of paroxysmal mitral fibrillation with no episodes since she was seen last. She currently is in sinus rhythm not on oral anticoagulants

## 2016-02-26 DIAGNOSIS — H0011 Chalazion right upper eyelid: Secondary | ICD-10-CM | POA: Diagnosis not present

## 2016-02-26 DIAGNOSIS — H04123 Dry eye syndrome of bilateral lacrimal glands: Secondary | ICD-10-CM | POA: Diagnosis not present

## 2016-02-26 DIAGNOSIS — H10413 Chronic giant papillary conjunctivitis, bilateral: Secondary | ICD-10-CM | POA: Diagnosis not present

## 2016-03-08 ENCOUNTER — Ambulatory Visit: Payer: Medicare Other | Admitting: Neurology

## 2016-03-11 ENCOUNTER — Other Ambulatory Visit: Payer: Self-pay | Admitting: Cardiovascular Disease

## 2016-03-25 DIAGNOSIS — Z1231 Encounter for screening mammogram for malignant neoplasm of breast: Secondary | ICD-10-CM | POA: Diagnosis not present

## 2016-03-25 DIAGNOSIS — N905 Atrophy of vulva: Secondary | ICD-10-CM | POA: Diagnosis not present

## 2016-03-25 DIAGNOSIS — N952 Postmenopausal atrophic vaginitis: Secondary | ICD-10-CM | POA: Diagnosis not present

## 2016-04-01 DIAGNOSIS — L570 Actinic keratosis: Secondary | ICD-10-CM | POA: Diagnosis not present

## 2016-05-05 DIAGNOSIS — Z23 Encounter for immunization: Secondary | ICD-10-CM | POA: Diagnosis not present

## 2016-10-15 DIAGNOSIS — J9801 Acute bronchospasm: Secondary | ICD-10-CM | POA: Diagnosis not present

## 2016-10-15 DIAGNOSIS — J209 Acute bronchitis, unspecified: Secondary | ICD-10-CM | POA: Diagnosis not present

## 2017-01-11 DIAGNOSIS — R32 Unspecified urinary incontinence: Secondary | ICD-10-CM | POA: Diagnosis not present

## 2017-01-11 DIAGNOSIS — N816 Rectocele: Secondary | ICD-10-CM | POA: Diagnosis not present

## 2017-01-11 DIAGNOSIS — N952 Postmenopausal atrophic vaginitis: Secondary | ICD-10-CM | POA: Diagnosis not present

## 2017-01-11 DIAGNOSIS — N3021 Other chronic cystitis with hematuria: Secondary | ICD-10-CM | POA: Diagnosis not present

## 2017-01-18 DIAGNOSIS — R32 Unspecified urinary incontinence: Secondary | ICD-10-CM | POA: Diagnosis not present

## 2017-01-25 DIAGNOSIS — R300 Dysuria: Secondary | ICD-10-CM | POA: Diagnosis not present

## 2017-01-25 DIAGNOSIS — N3021 Other chronic cystitis with hematuria: Secondary | ICD-10-CM | POA: Diagnosis not present

## 2017-01-25 DIAGNOSIS — R32 Unspecified urinary incontinence: Secondary | ICD-10-CM | POA: Diagnosis not present

## 2017-02-23 ENCOUNTER — Encounter: Payer: Self-pay | Admitting: Cardiovascular Disease

## 2017-02-23 ENCOUNTER — Ambulatory Visit (INDEPENDENT_AMBULATORY_CARE_PROVIDER_SITE_OTHER): Payer: Medicare Other | Admitting: Cardiovascular Disease

## 2017-02-23 VITALS — BP 140/80 | HR 78 | Ht 62.0 in | Wt 174.0 lb

## 2017-02-23 DIAGNOSIS — E785 Hyperlipidemia, unspecified: Secondary | ICD-10-CM | POA: Diagnosis not present

## 2017-02-23 DIAGNOSIS — I10 Essential (primary) hypertension: Secondary | ICD-10-CM | POA: Diagnosis not present

## 2017-02-23 DIAGNOSIS — I480 Paroxysmal atrial fibrillation: Secondary | ICD-10-CM

## 2017-02-23 DIAGNOSIS — G4733 Obstructive sleep apnea (adult) (pediatric): Secondary | ICD-10-CM

## 2017-02-23 MED ORDER — METOPROLOL SUCCINATE ER 100 MG PO TB24: 100.0000 mg | ORAL_TABLET | Freq: Every day | ORAL | 3 refills | Status: DC

## 2017-02-23 MED ORDER — OMEPRAZOLE 20 MG PO CPDR: 20.0000 mg | DELAYED_RELEASE_CAPSULE | Freq: Every day | ORAL | 3 refills | Status: DC

## 2017-02-23 MED ORDER — LISINOPRIL-HYDROCHLOROTHIAZIDE 20-12.5 MG PO TABS: 1.0000 | ORAL_TABLET | Freq: Every day | ORAL | 3 refills | Status: DC

## 2017-02-23 NOTE — Progress Notes (Signed)
02/23/2017 Donna Sloan   1940-08-08  [id number]  Primary Physician Stephens Shire, MD Primary Cardiologist: Lorretta Harp MD Renae Gloss  HPI:  The patient is a 76 year old, moderately overweight, married, Caucasian female mother of 2, grandmother to 2 grandchildren whose husband is also a patient of mine and who is accompanying her today. I last saw her 02/20/16. She has a history of PAF, improved on low-dose beta blocker. Her other problems include obstructive sleep apnea; she never pursued a CPAP titration study after her initial diagnostic tests. She has hypertension and hyperlipidemia as well. She also has symptoms compatible with restless leg syndrome. Lower extremity Dopplers are normal. She says that she feels that the simvastatin which I put her on causes nausea at night and this was discontinued. She has been asymptomatic since I saw her.   Current Outpatient Prescriptions  Medication Sig Dispense Refill  . aspirin 81 MG tablet Take 81 mg by mouth daily.    [name] lisinopril-hydrochlorothiazide (PRINZIDE,ZESTORETIC) 20-12.5 MG per tablet Take 1 tablet by mouth daily. 30 tablet 11  . metoprolol succinate (TOPROL-XL) 100 MG 24 hr tablet Take 1 tablet (100 mg total) by mouth daily. 30 tablet 11  . omeprazole (PRILOSEC) 20 MG capsule take 1 capsule by mouth once daily 90 capsule 3   No current facility-administered medications for this visit.     Allergies  Allergen Reactions  . Penicillins   . Rosuvastatin Nausea And Vomiting  . Simvastatin Nausea And Vomiting    Social History   Social History  . Marital status: Married    Spouse name: N/A  . Number of children: N/A  . Years of education: N/A   Occupational History  . Not on file.   Social History Main Topics  . Smoking status: Never Smoker  . Smokeless tobacco: Never Used  . Alcohol use No  . Drug use: No  . Sexual activity: Not on file   Other Topics Concern  . Not on file   Social  History Narrative   Lives with husband.  Has 2 sons.  Retired from [employer] working.  Education: high school.       Review of Systems: General: negative for chills, fever, night sweats or weight changes.  Cardiovascular: negative for chest pain, dyspnea on exertion, edema, orthopnea, palpitations, paroxysmal nocturnal dyspnea or shortness of breath Dermatological: negative for rash Respiratory: negative for cough or wheezing Urologic: negative for hematuria Abdominal: negative for nausea, vomiting, diarrhea, bright red blood per rectum, melena, or hematemesis Neurologic: negative for visual changes, syncope, or dizziness All other systems reviewed and are otherwise negative except as noted above.    Blood pressure 140/80, pulse 78, height 5\' 2"  (1.575 m), weight 174 lb (78.9 kg).  General appearance: alert and no distress Neck: no adenopathy, no carotid bruit, no JVD, supple, symmetrical, trachea midline and thyroid not enlarged, symmetric, no tenderness/mass/nodules Lungs: clear to auscultation bilaterally Heart: regular rate and rhythm, S1, S2 normal, no murmur, click, rub or gallop Extremities: extremities normal, atraumatic, no cyanosis or edema  EKG sinus rhythm at 78 with nonspecific ST and T-wave changes. I personally reviewed this EKG.  ASSESSMENT AND PLAN:   PAF (paroxysmal atrial fibrillation) History of PAF remotely on high-dose beta blocker. She has not cleaned of any episodes of sinus on her last  Essential hypertension History of essential hypertension blood pressure measures 140/80. She is on lisinopril, hydrochlorothiazide and metoprolol. Continue current meds at current dosing  Hyperlipidemia History of hyperlipidemia not on statin therapy. We will recheck a lipid and liver profile  Obstructive sleep apnea History of obstructive sleep apnea not on CPAP by her choice.      Lorretta Harp MD FACP,FACC,FAHA, [facility] 02/23/2017 9:47 AM

## 2017-02-23 NOTE — Patient Instructions (Signed)

## 2017-02-23 NOTE — Assessment & Plan Note (Signed)
History of essential hypertension blood pressure measures 140/80. She is on lisinopril, hydrochlorothiazide and metoprolol. Continue current meds at current dosing

## 2017-02-23 NOTE — Assessment & Plan Note (Signed)
History of obstructive sleep apnea not on CPAP by her choice.

## 2017-02-23 NOTE — Assessment & Plan Note (Signed)
History of PAF remotely on high-dose beta blocker. She has not cleaned of any episodes of sinus on her last

## 2017-02-23 NOTE — Addendum Note (Signed)
Addended by: Therisa Doyne on: 02/23/2017 09:50 AM   Modules accepted: Orders

## 2017-02-23 NOTE — Assessment & Plan Note (Signed)
History of hyperlipidemia not on statin therapy.  We will recheck a lipid and liver profile. 

## 2017-03-07 DIAGNOSIS — R300 Dysuria: Secondary | ICD-10-CM | POA: Diagnosis not present

## 2017-03-07 DIAGNOSIS — E785 Hyperlipidemia, unspecified: Secondary | ICD-10-CM | POA: Diagnosis not present

## 2017-03-07 DIAGNOSIS — N390 Urinary tract infection, site not specified: Secondary | ICD-10-CM | POA: Diagnosis not present

## 2017-03-07 LAB — LIPID PANEL
CHOL/HDL RATIO: 3.4 ratio (ref 0.0–4.4)
Cholesterol, Total: 234 mg/dL — ABNORMAL HIGH (ref 100–199)
HDL: 68 mg/dL (ref >39)
LDL Calculated: 144 mg/dL — ABNORMAL HIGH (ref 0–99)
Triglycerides: 112 mg/dL (ref 0–149)
VLDL CHOLESTEROL CAL: 22 mg/dL (ref 5–40)

## 2017-03-07 LAB — HEPATIC FUNCTION PANEL
ALBUMIN: 4.2 g/dL (ref 3.5–4.8)
ALK PHOS: 126 IU/L — AB (ref 39–117)
ALT: 16 IU/L (ref 0–32)
AST: 25 IU/L (ref 0–40)
Bilirubin Total: 0.5 mg/dL (ref 0.0–1.2)
Bilirubin, Direct: 0.11 mg/dL (ref 0.00–0.40)
TOTAL PROTEIN: 6.9 g/dL (ref 6.0–8.5)

## 2017-03-14 ENCOUNTER — Other Ambulatory Visit: Payer: Self-pay | Admitting: Cardiovascular Disease

## 2017-03-14 ENCOUNTER — Encounter: Payer: Self-pay | Admitting: Cardiovascular Disease

## 2017-03-14 DIAGNOSIS — E785 Hyperlipidemia, unspecified: Secondary | ICD-10-CM

## 2017-03-31 ENCOUNTER — Other Ambulatory Visit: Payer: Self-pay | Admitting: Cardiovascular Disease

## 2017-03-31 DIAGNOSIS — Z1231 Encounter for screening mammogram for malignant neoplasm of breast: Secondary | ICD-10-CM | POA: Diagnosis not present

## 2017-03-31 MED ORDER — PRAVASTATIN SODIUM 20 MG PO TABS: 20.0000 mg | ORAL_TABLET | Freq: Every evening | ORAL | 3 refills | Status: DC

## 2017-03-31 NOTE — Telephone Encounter (Signed)
Spoke with patient and informed her that her RX's were sent last month 90 day supply with 3 additional refills; patient stated they pharmacy does not have any rx's from our office. Called pharmacy, pharmacist stated the patient picked up Linisopril/hctz and Metoprolol on 844/18 with a 90 day supply.  Patient stated Lovena Le was supposed to call in Pravastatin 20mg  qd but she needed to know what pharmacy to send it to. I looked in the chart no phone note, lab results stated patient needed Pravastatin. I will send Rx to pharmacy and patient directly notified.

## 2017-03-31 NOTE — Telephone Encounter (Signed)
New message     Pt wants all medication sent to [address] - [facility]  [phone] , Lovena Le requested this information

## 2017-04-25 DIAGNOSIS — Z1211 Encounter for screening for malignant neoplasm of colon: Secondary | ICD-10-CM | POA: Diagnosis not present

## 2017-04-25 DIAGNOSIS — Z1212 Encounter for screening for malignant neoplasm of rectum: Secondary | ICD-10-CM | POA: Diagnosis not present

## 2017-05-10 DIAGNOSIS — Z23 Encounter for immunization: Secondary | ICD-10-CM | POA: Diagnosis not present

## 2017-12-28 DIAGNOSIS — N3090 Cystitis, unspecified without hematuria: Secondary | ICD-10-CM | POA: Diagnosis not present

## 2017-12-28 DIAGNOSIS — R300 Dysuria: Secondary | ICD-10-CM | POA: Diagnosis not present

## 2018-02-06 ENCOUNTER — Encounter (HOSPITAL_COMMUNITY): Payer: Self-pay | Admitting: Emergency Medicine

## 2018-02-06 ENCOUNTER — Other Ambulatory Visit: Payer: Self-pay

## 2018-02-06 DIAGNOSIS — R3121 Asymptomatic microscopic hematuria: Secondary | ICD-10-CM | POA: Diagnosis not present

## 2018-02-06 DIAGNOSIS — Z8542 Personal history of malignant neoplasm of other parts of uterus: Secondary | ICD-10-CM | POA: Diagnosis not present

## 2018-02-06 DIAGNOSIS — I10 Essential (primary) hypertension: Secondary | ICD-10-CM | POA: Diagnosis not present

## 2018-02-06 DIAGNOSIS — Z79899 Other long term (current) drug therapy: Secondary | ICD-10-CM | POA: Diagnosis not present

## 2018-02-06 DIAGNOSIS — N3000 Acute cystitis without hematuria: Secondary | ICD-10-CM | POA: Diagnosis not present

## 2018-02-06 DIAGNOSIS — I517 Cardiomegaly: Secondary | ICD-10-CM | POA: Diagnosis not present

## 2018-02-06 DIAGNOSIS — Z9114 Patient's other noncompliance with medication regimen: Secondary | ICD-10-CM | POA: Diagnosis not present

## 2018-02-06 DIAGNOSIS — R197 Diarrhea, unspecified: Secondary | ICD-10-CM | POA: Diagnosis not present

## 2018-02-06 DIAGNOSIS — R300 Dysuria: Secondary | ICD-10-CM | POA: Diagnosis not present

## 2018-02-06 LAB — URINALYSIS, ROUTINE W REFLEX MICROSCOPIC
Bilirubin Urine: NEGATIVE
GLUCOSE, UA: NEGATIVE mg/dL
KETONES UR: NEGATIVE mg/dL
Nitrite: NEGATIVE
PH: 5 (ref 5.0–8.0)
PROTEIN: 30 mg/dL — AB
Specific Gravity, Urine: 1.023 (ref 1.005–1.030)

## 2018-02-06 LAB — COMPREHENSIVE METABOLIC PANEL
ALBUMIN: 3.9 g/dL (ref 3.5–5.0)
ALT: 21 U/L (ref 0–44)
ANION GAP: 8 (ref 5–15)
AST: 24 U/L (ref 15–41)
Alkaline Phosphatase: 112 U/L (ref 38–126)
BILIRUBIN TOTAL: 0.3 mg/dL (ref 0.3–1.2)
BUN: 20 mg/dL (ref 8–23)
CO2: 26 mmol/L (ref 22–32)
Calcium: 9.9 mg/dL (ref 8.9–10.3)
Chloride: 108 mmol/L (ref 98–111)
Creatinine, Ser: 1.07 mg/dL — ABNORMAL HIGH (ref 0.44–1.00)
GFR calc Af Amer: 57 mL/min — ABNORMAL LOW (ref >60)
GFR, EST NON AFRICAN AMERICAN: 49 mL/min — AB (ref >60)
GLUCOSE: 105 mg/dL — AB (ref 70–99)
POTASSIUM: 3.7 mmol/L (ref 3.5–5.1)
Sodium: 142 mmol/L (ref 135–145)
TOTAL PROTEIN: 7.2 g/dL (ref 6.5–8.1)

## 2018-02-06 LAB — CBC
HEMATOCRIT: 44.7 % (ref 36.0–46.0)
HEMOGLOBIN: 14.6 g/dL (ref 12.0–15.0)
MCH: 30.5 pg (ref 26.0–34.0)
MCHC: 32.7 g/dL (ref 30.0–36.0)
MCV: 93.3 fL (ref 78.0–100.0)
Platelets: 198 10*3/uL (ref 150–400)
RBC: 4.79 MIL/uL (ref 3.87–5.11)
RDW: 13.6 % (ref 11.5–15.5)
WBC: 8.2 10*3/uL (ref 4.0–10.5)

## 2018-02-06 LAB — LIPASE, BLOOD: Lipase: 41 U/L (ref 11–51)

## 2018-02-06 NOTE — ED Notes (Signed)
Pt has urine culture in lab if needed.  

## 2018-02-06 NOTE — ED Triage Notes (Signed)
Pt reports having dysuria for the last several days and has had intermittent episodes of diarrhea and vomiting. Pt also reports not taking blood pressure medication for the last several weeks.

## 2018-02-07 ENCOUNTER — Encounter (HOSPITAL_COMMUNITY): Payer: Self-pay | Admitting: Emergency Medicine

## 2018-02-07 ENCOUNTER — Emergency Department (HOSPITAL_COMMUNITY)
Admission: EM | Admit: 2018-02-07 | Discharge: 2018-02-07 | Disposition: A | Discharge status: Home / Self Care | Payer: Medicare Other | Attending: Emergency Medicine | Admitting: Emergency Medicine

## 2018-02-07 DIAGNOSIS — N3000 Acute cystitis without hematuria: Secondary | ICD-10-CM | POA: Diagnosis not present

## 2018-02-07 LAB — I-STAT TROPONIN, ED: Troponin i, poc: 0 ng/mL (ref 0.00–0.08)

## 2018-02-07 MED ORDER — SULFAMETHOXAZOLE-TRIMETHOPRIM 800-160 MG PO TABS: 1.0000 | ORAL_TABLET | Freq: Two times a day (BID) | ORAL | 0 refills | Status: AC

## 2018-02-07 MED ORDER — ALUM & MAG HYDROXIDE-SIMETH 200-200-20 MG/5ML PO SUSP
30.0000 mL | Freq: Once | ORAL | Status: AC
  Administered 2018-02-07: 30 mL via ORAL
  Filled 2018-02-07: qty 30

## 2018-02-07 MED ORDER — RISAQUAD PO CAPS: 2.0000 | ORAL_CAPSULE | Freq: Three times a day (TID) | ORAL | Status: DC

## 2018-02-07 MED ORDER — CULTURELLE DIGESTIVE HEALTH PO CAPS: 1.0000 | ORAL_CAPSULE | Freq: Three times a day (TID) | ORAL | 0 refills | Status: DC

## 2018-02-07 MED ORDER — SULFAMETHOXAZOLE-TRIMETHOPRIM 800-160 MG PO TABS
1.0000 | ORAL_TABLET | Freq: Once | ORAL | Status: AC
  Administered 2018-02-07: 1 via ORAL
  Filled 2018-02-07: qty 1

## 2018-02-07 NOTE — ED Notes (Signed)
Spoke with Lelan Pons in lab, stated they had added on urine culture in grey top tube that was previously sent to lab.

## 2018-02-07 NOTE — ED Provider Notes (Signed)
[facility] DEPT Provider Note   CSN: [id number] Arrival date & time: 02/06/18  1912     History   Chief Complaint Chief Complaint  Patient presents with  . Dysuria  . Diarrhea    HPI Donna Sloan is a 77 y.o. female.  The history is provided by the patient.  Dysuria   This is a recurrent problem. The current episode started more than 2 days ago. The problem occurs every urination. The problem has not changed since onset.The quality of the pain is described as burning. The pain is at a severity of 6/10. The pain is moderate. There has been no fever. She is not sexually active. There is no history of pyelonephritis. Associated symptoms include frequency. Pertinent negatives include no chills, no sweats, no nausea, no vomiting and no discharge. She has tried nothing for the symptoms. Her past medical history is significant for recurrent UTIs. Her past medical history does not include kidney stones.  Diarrhea   This is a new problem. The current episode started more than 2 days ago. The problem occurs 2 to 4 times per day. The problem has been resolved. The stool consistency is described as watery. There has been no fever. Pertinent negatives include no abdominal pain, no vomiting, no chills and no sweats. She has tried nothing for the symptoms. The treatment provided significant relief. Her past medical history does not include irritable bowel syndrome or inflammatory bowel disease.  Diarrhea resolved.  Not taking her BP meds.    Past Medical History:  Diagnosis Date  . Chest pain    2D ECHO, 11/24/2010 - EF >55%, NUCLEAR STRESS TEST, 11/24/2010 - normal, no EKG changes for ischemia  . Claudication ([place])    LEA DUPLEX, 12/15/2010 - normal scan  . Endometrial adenocarcinoma ([place])    Stage I  . GERD (gastroesophageal reflux disease)   . Hyperlipidemia    statin intolerant  . Hypertension   . Urinary incontinence     Patient Active Problem List   Diagnosis Date Noted  . PAF (paroxysmal atrial fibrillation) ([place]) 01/15/2014  . Obstructive sleep apnea 01/04/2013  . Hyperlipidemia   . Endometrial/uterine adenocarcinoma ([place]) 11/11/2011  . Essential hypertension 09/15/2007  . TONSILLECTOMY, HX OF 09/15/2007  . CARPAL TUNNEL RELEASE, HX OF 09/15/2007  . DYSKINESIA OF ESOPHAGUS 03/03/2007  . MENOPAUSE, SURGICAL 11/01/2006    Past Surgical History:  Procedure Laterality Date  . BLADDER SUSPENSION    . CARPAL TUNNEL RELEASE    . LAPAROSCOPIC ASSISTED VAGINAL HYSTERECTOMY  12/2006   BSO with anterior colporrhaphy     OB History   None      Home Medications    Prior to Admission medications   Medication Sig Start Date End Date Taking? Authorizing Provider  lisinopril-hydrochlorothiazide (PRINZIDE,ZESTORETIC) 20-12.5 MG tablet Take 1 tablet by mouth daily. 02/23/17  Yes Lorretta Harp, MD  metoprolol succinate (TOPROL-XL) 100 MG 24 hr tablet Take 1 tablet (100 mg total) by mouth daily. 02/23/17  Yes Lorretta Harp, MD  omeprazole (PRILOSEC) 20 MG capsule Take 1 capsule (20 mg total) by mouth daily. 02/23/17  Yes Lorretta Harp, MD    Family History Family History  Problem Relation Age of Onset  . Heart attack Father 42       Massive - deceased  . Heart attack Brother 57       Massive - deceased  . Heart attack Maternal Grandfather   . Kidney disease Maternal Grandmother   .  Heart attack Paternal Grandfather     Social History Social History   Tobacco Use  . Smoking status: Never Smoker  . Smokeless tobacco: Never Used  Substance Use Topics  . Alcohol use: No    Alcohol/week: 0.0 oz  . Drug use: No     Allergies   Penicillins; Rosuvastatin; and Simvastatin   Review of Systems Review of Systems  Constitutional: Negative for chills, fatigue and fever.  HENT: Negative for facial swelling.   Gastrointestinal: Positive for diarrhea. Negative for abdominal pain, anal bleeding, blood in stool, nausea and  vomiting.  Genitourinary: Positive for dysuria and frequency.  All other systems reviewed and are negative.    Physical Exam Updated Vital Signs BP (!) 184/89   Pulse 76   Temp 98.1 F (36.7 C) (Oral)   Resp (!) 21   Ht 5\' 2"  (1.575 m)   Wt 78 kg (172 lb)   SpO2 100%   BMI 31.46 kg/m   Physical Exam  Constitutional: She is oriented to person, place, and time. She appears well-developed and well-nourished. No distress.  HENT:  Head: Normocephalic and atraumatic.  Mouth/Throat: No oropharyngeal exudate.  Eyes: Pupils are equal, round, and reactive to light. Conjunctivae are normal.  Neck: Normal range of motion. Neck supple.  Cardiovascular: Normal rate, regular rhythm, normal heart sounds and intact distal pulses.  Pulmonary/Chest: Effort normal and breath sounds normal. No stridor. She has no wheezes. She has no rales.  Abdominal: Soft. Bowel sounds are normal. She exhibits no mass. There is no tenderness. There is no rebound and no guarding. No hernia.  Musculoskeletal: Normal range of motion.  Neurological: She is alert and oriented to person, place, and time. She displays normal reflexes.  Skin: Skin is warm and dry. Capillary refill takes less than 2 seconds.  Psychiatric: She has a normal mood and affect.     ED Treatments / Results  Labs (all labs ordered are listed, but only abnormal results are displayed) Results for orders placed or performed during the hospital encounter of 02/07/18  Urinalysis, Routine w reflex microscopic- may I&O cath if menses  Result Value Ref Range   Color, Urine YELLOW YELLOW   APPearance CLOUDY (A) CLEAR   Specific Gravity, Urine 1.023 1.005 - 1.030   pH 5.0 5.0 - 8.0   Glucose, UA NEGATIVE NEGATIVE mg/dL   Hgb urine dipstick MODERATE (A) NEGATIVE   Bilirubin Urine NEGATIVE NEGATIVE   Ketones, ur NEGATIVE NEGATIVE mg/dL   Protein, ur 30 (A) NEGATIVE mg/dL   Nitrite NEGATIVE NEGATIVE   Leukocytes, UA LARGE (A) NEGATIVE   RBC / HPF  21-50 0 - 5 RBC/hpf   WBC, UA >50 (H) 0 - 5 WBC/hpf   Bacteria, UA MANY (A) NONE SEEN   Squamous Epithelial / LPF 0-5 0 - 5   Mucus PRESENT    Hyaline Casts, UA PRESENT   Lipase, blood  Result Value Ref Range   Lipase 41 11 - 51 U/L  Comprehensive metabolic panel  Result Value Ref Range   Sodium 142 135 - 145 mmol/L   Potassium 3.7 3.5 - 5.1 mmol/L   Chloride 108 98 - 111 mmol/L   CO2 26 22 - 32 mmol/L   Glucose, Bld 105 (H) 70 - 99 mg/dL   BUN 20 8 - 23 mg/dL   Creatinine, Ser 1.07 (H) 0.44 - 1.00 mg/dL   Calcium 9.9 8.9 - 10.3 mg/dL   Total Protein 7.2 6.5 - 8.1 g/dL  Albumin 3.9 3.5 - 5.0 g/dL   AST 24 15 - 41 U/L   ALT 21 0 - 44 U/L   Alkaline Phosphatase 112 38 - 126 U/L   Total Bilirubin 0.3 0.3 - 1.2 mg/dL   GFR calc non Af Amer 49 (L) >60 mL/min   GFR calc Af Amer 57 (L) >60 mL/min   Anion gap 8 5 - 15  CBC  Result Value Ref Range   WBC 8.2 4.0 - 10.5 K/uL   RBC 4.79 3.87 - 5.11 MIL/uL   Hemoglobin 14.6 12.0 - 15.0 g/dL   HCT 44.7 36.0 - 46.0 %   MCV 93.3 78.0 - 100.0 fL   MCH 30.5 26.0 - 34.0 pg   MCHC 32.7 30.0 - 36.0 g/dL   RDW 13.6 11.5 - 15.5 %   Platelets 198 150 - 400 K/uL  I-stat troponin, ED  Result Value Ref Range   Troponin i, poc 0.00 0.00 - 0.08 ng/mL   Comment 3           No results found.  EKG EKG Interpretation  Date/Time:  Monday February 06 2018 20:07:31 EDT Ventricular Rate:  73 PR Interval:    QRS Duration: 90 QT Interval:  390 QTC Calculation: 430 R Axis:   56 Text Interpretation:  Sinus rhythm Left atrial enlargement Minimal ST depression, anterolateral leads Baseline wander in lead(s) V1 since last tracing no significant change Confirmed by Daleen Bo [phone]) on 02/06/2018 8:36:10 PM   Radiology No results found.  Procedures Procedures (including critical care time)  Medications Ordered in ED Medications  acidophilus (RISAQUAD) capsule 2 capsule (has no administration in time range)  sulfamethoxazole-trimethoprim  (BACTRIM DS,SEPTRA DS) 800-160 MG per tablet 1 tablet (1 tablet Oral Given 02/07/18 0121)  alum & mag hydroxide-simeth (MAALOX/MYLANTA) 200-200-20 MG/5ML suspension 30 mL (30 mLs Oral Given 02/07/18 0121)       Final Clinical Impressions(s) / ED Diagnoses   No diarrhea since the weekend.  Will start bactrim for UTI and have cultured the urine.  Will start probiotics to prevent diarrhea.  Follow up with your PMD for recheck in 2 days.   Return for pain, numbness, changes in vision or speech, fevers >100.4 unrelieved by medication, shortness of breath, intractable vomiting, or diarrhea, abdominal pain, Inability to tolerate liquids or food, cough, altered mental status or any concerns. No signs of systemic illness or infection. The patient is nontoxic-appearing on exam and vital signs are within normal limits. Will refer to urology for microscopy hematuria as patient is asymptomatic.  I have reviewed the triage vital signs and the nursing notes. Pertinent labs &imaging results that were available during my care of the patient were reviewed by me and considered in my medical decision making (see chart for details).  After history, exam, and medical workup I feel the patient has been appropriately medically screened and is safe for discharge home. Pertinent diagnoses were discussed with the patient. Patient was given return precautions.     Isair Inabinet, MD 02/07/18 [phone]

## 2018-02-09 DIAGNOSIS — A029 Salmonella infection, unspecified: Secondary | ICD-10-CM | POA: Diagnosis not present

## 2018-02-09 DIAGNOSIS — R7989 Other specified abnormal findings of blood chemistry: Secondary | ICD-10-CM | POA: Diagnosis not present

## 2018-02-27 LAB — URINE CULTURE

## 2018-02-28 ENCOUNTER — Encounter: Payer: Self-pay | Admitting: Cardiovascular Disease

## 2018-02-28 ENCOUNTER — Ambulatory Visit (INDEPENDENT_AMBULATORY_CARE_PROVIDER_SITE_OTHER): Payer: Medicare Other | Admitting: Cardiovascular Disease

## 2018-02-28 ENCOUNTER — Telehealth: Payer: Self-pay | Admitting: Emergency Medicine

## 2018-02-28 DIAGNOSIS — G4733 Obstructive sleep apnea (adult) (pediatric): Secondary | ICD-10-CM | POA: Diagnosis not present

## 2018-02-28 DIAGNOSIS — E7800 Pure hypercholesterolemia, unspecified: Secondary | ICD-10-CM | POA: Diagnosis not present

## 2018-02-28 DIAGNOSIS — I480 Paroxysmal atrial fibrillation: Secondary | ICD-10-CM

## 2018-02-28 DIAGNOSIS — I10 Essential (primary) hypertension: Secondary | ICD-10-CM

## 2018-02-28 NOTE — Assessment & Plan Note (Signed)
History of essential hypertension her blood pressure measured today at 122/72.  She is on lisinopril and hydrochlorothiazide as well as metoprolol.  Continue current meds at current dosing.

## 2018-02-28 NOTE — Telephone Encounter (Signed)
Post ED Visit - Positive Culture Follow-up  Culture report reviewed by antimicrobial stewardship pharmacist:  []  Elenor Quinones, Pharm.D. []  Heide Guile, Pharm.D., BCPS AQ-ID []  Parks Neptune, Pharm.D., BCPS []  Alycia Rossetti, Pharm.D., BCPS []  Vass, Pharm.D., BCPS, AAHIVP []  Legrand Como, Pharm.D., BCPS, AAHIVP [x]  Salome Arnt, PharmD, BCPS []  Johnnette Gourd, PharmD, BCPS []  Hughes Better, PharmD, BCPS []  Leeroy Cha, PharmD  Positive urine culture Treated with sulfamethoxazole-trimethoprim, organism sensitive to the same and no further patient follow-up is required at this time.  Hazle Nordmann 02/28/2018, 11:10 AM

## 2018-02-28 NOTE — Assessment & Plan Note (Signed)
History of obstructive sleep apnea not on CPAP. 

## 2018-02-28 NOTE — Patient Instructions (Signed)
Medication Instructions:  Your physician recommends that you continue on your current medications as directed. Please refer to the Current Medication list given to you today.   Labwork: Your physician recommends that you return for lab work in: [place]   Testing/Procedures: NONE  Follow-Up: Your physician wants you to follow-up in: [place]. You will receive a reminder letter in the mail two months in advance. If you don't receive a letter, please call our office to schedule the follow-up appointment.   Any Other Special Instructions Will Be Listed Below (If Applicable).     If you need a refill on your cardiac medications before your next appointment, please call your pharmacy.

## 2018-02-28 NOTE — Progress Notes (Signed)
02/28/2018 Donna Sloan   1941-02-06  [id number]  Primary Physician Stephens Shire, MD Primary Cardiologist: Lorretta Harp MD Donna Sloan, [place]  HPI:  Donna Sloan is a 77 y.o.  moderately overweight, married, Caucasian female mother of 2, grandmother to 2 grandchildren whose husband is also a patient of mine and who is accompanying her today. I last saw her  02/23/2017. She has a history of PAF, improved on low-dose beta blocker. Her other problems include obstructive sleep apnea; she never pursued a CPAP titration study after her initial diagnostic tests. She has hypertension and hyperlipidemia as well. She also has symptoms compatible with restless leg syndrome. Lower extremity Dopplers are normal. Since I saw her a year ago she is remained stable.  She does admit to taking her pravastatin sporadically.  She denies chest pain or shortness of breath.     Current Meds  Medication Sig  . aspirin 81 MG tablet Take 81 mg by mouth daily.  [name] lisinopril-hydrochlorothiazide (PRINZIDE,ZESTORETIC) 20-12.5 MG tablet Take 1 tablet by mouth daily.  . metoprolol succinate (TOPROL-XL) 100 MG 24 hr tablet Take 1 tablet (100 mg total) by mouth daily.  [name] omeprazole (PRILOSEC) 20 MG capsule Take 1 capsule (20 mg total) by mouth daily.  . pravastatin (PRAVACHOL) 20 MG tablet Take 20 mg by mouth daily.  . [DISCONTINUED] Lactobacillus-Inulin ([place]) CAPS Take 1 capsule by mouth 3 (three) times daily.     Allergies  Allergen Reactions  . Penicillins     Has patient had a PCN reaction causing immediate rash, facial/tongue/throat swelling, SOB or lightheadedness with hypotension: Yes Has patient had a PCN reaction causing severe rash involving mucus membranes or skin necrosis: No Has patient had a PCN reaction that required hospitalization: No Has patient had a PCN reaction occurring within the last 10 years: No If all of the above answers are "NO", then may proceed  with Cephalosporin use.  . Rosuvastatin Nausea And Vomiting  . Simvastatin Nausea And Vomiting    Social History   Socioeconomic History  . Marital status: Married    Spouse name: Not on file  . Number of children: Not on file  . Years of education: Not on file  . Highest education level: Not on file  Occupational History  . Not on file  Social Needs  . Financial resource strain: Not on file  . Food insecurity:    Worry: Not on file    Inability: Not on file  . Transportation needs:    Medical: Not on file    Non-medical: Not on file  Tobacco Use  . Smoking status: Never Smoker  . Smokeless tobacco: Never Used  Substance and Sexual Activity  . Alcohol use: No    Alcohol/week: 0.0 oz  . Drug use: No  . Sexual activity: Not on file  Lifestyle  . Physical activity:    Days per week: Not on file    Minutes per session: Not on file  . Stress: Not on file  Relationships  . Social connections:    Talks on phone: Not on file    Gets together: Not on file    Attends religious service: Not on file    Active member of club or organization: Not on file    Attends meetings of clubs or organizations: Not on file    Relationship status: Not on file  . Intimate partner violence:    Fear of current or ex  partner: Not on file    Emotionally abused: Not on file    Physically abused: Not on file    Forced sexual activity: Not on file  Other Topics Concern  . Not on file  Social History Narrative   Lives with husband.  Has 2 sons.  Retired from [employer] working.  Education: high school.       Review of Systems: General: negative for chills, fever, night sweats or weight changes.  Cardiovascular: negative for chest pain, dyspnea on exertion, edema, orthopnea, palpitations, paroxysmal nocturnal dyspnea or shortness of breath Dermatological: negative for rash Respiratory: negative for cough or wheezing Urologic: negative for hematuria Abdominal: negative for nausea, vomiting,  diarrhea, bright red blood per rectum, melena, or hematemesis Neurologic: negative for visual changes, syncope, or dizziness All other systems reviewed and are otherwise negative except as noted above.    Blood pressure 122/72, pulse 70, height 5\' 2"  (1.575 m), weight 170 lb (77.1 kg).  General appearance: alert and no distress Neck: no adenopathy, no carotid bruit, no JVD, supple, symmetrical, trachea midline and thyroid not enlarged, symmetric, no tenderness/mass/nodules Lungs: clear to auscultation bilaterally Heart: regular rate and rhythm, S1, S2 normal, no murmur, click, rub or gallop Extremities: extremities normal, atraumatic, no cyanosis or edema Pulses: 2+ and symmetric Skin: Skin color, texture, turgor normal. No rashes or lesions Neurologic: Alert and oriented X 3, normal strength and tone. Normal symmetric reflexes. Normal coordination and gait  EKG not performed today  ASSESSMENT AND PLAN:   PAF (paroxysmal atrial fibrillation) History of PAF maintaining sinus rhythm  Essential hypertension History of essential hypertension her blood pressure measured today at 122/72.  She is on lisinopril and hydrochlorothiazide as well as metoprolol.  Continue current meds at current dosing.  Hyperlipidemia History of hyperlipidemia on pravastatin which she takes randomly.  Her last cholesterol performed 03/07/2017 revealed total cholesterol 234, LDL 144 and HDL 68.  We will recheck a lipid liver profile.  Obstructive sleep apnea History of obstructive sleep apnea not on CPAP.      Lorretta Harp MD FACP,FACC,FAHA, [facility] 02/28/2018 10:50 AM

## 2018-02-28 NOTE — Assessment & Plan Note (Signed)
History of hyperlipidemia on pravastatin which she takes randomly.  Her last cholesterol performed 03/07/2017 revealed total cholesterol 234, LDL 144 and HDL 68.  We will recheck a lipid liver profile.

## 2018-02-28 NOTE — Assessment & Plan Note (Signed)
History of PAF maintaining sinus rhythm. 

## 2018-03-02 DIAGNOSIS — R7989 Other specified abnormal findings of blood chemistry: Secondary | ICD-10-CM | POA: Diagnosis not present

## 2018-03-07 ENCOUNTER — Other Ambulatory Visit: Payer: Self-pay | Admitting: Family Medicine

## 2018-03-07 DIAGNOSIS — R112 Nausea with vomiting, unspecified: Secondary | ICD-10-CM

## 2018-03-07 DIAGNOSIS — R1010 Upper abdominal pain, unspecified: Secondary | ICD-10-CM | POA: Diagnosis not present

## 2018-03-07 DIAGNOSIS — K219 Gastro-esophageal reflux disease without esophagitis: Secondary | ICD-10-CM | POA: Diagnosis not present

## 2018-03-15 ENCOUNTER — Ambulatory Visit
Admission: RE | Admit: 2018-03-15 | Discharge: 2018-03-15 | Disposition: A | Discharge status: Home / Self Care | Payer: Medicare Other | Source: Ambulatory Visit | Attending: Family Medicine | Admitting: Family Medicine

## 2018-03-15 DIAGNOSIS — R197 Diarrhea, unspecified: Secondary | ICD-10-CM | POA: Diagnosis not present

## 2018-03-15 DIAGNOSIS — R1010 Upper abdominal pain, unspecified: Secondary | ICD-10-CM

## 2018-03-15 DIAGNOSIS — R112 Nausea with vomiting, unspecified: Secondary | ICD-10-CM

## 2018-03-29 DIAGNOSIS — N3001 Acute cystitis with hematuria: Secondary | ICD-10-CM | POA: Diagnosis not present

## 2018-03-29 DIAGNOSIS — R300 Dysuria: Secondary | ICD-10-CM | POA: Diagnosis not present

## 2018-03-29 DIAGNOSIS — R82998 Other abnormal findings in urine: Secondary | ICD-10-CM | POA: Diagnosis not present

## 2018-03-29 DIAGNOSIS — N898 Other specified noninflammatory disorders of vagina: Secondary | ICD-10-CM | POA: Diagnosis not present

## 2018-04-06 DIAGNOSIS — H61001 Unspecified perichondritis of right external ear: Secondary | ICD-10-CM | POA: Diagnosis not present

## 2018-04-20 ENCOUNTER — Other Ambulatory Visit: Payer: Self-pay | Admitting: Cardiovascular Disease

## 2018-04-24 DIAGNOSIS — Z1231 Encounter for screening mammogram for malignant neoplasm of breast: Secondary | ICD-10-CM | POA: Diagnosis not present

## 2018-04-24 DIAGNOSIS — N390 Urinary tract infection, site not specified: Secondary | ICD-10-CM | POA: Diagnosis not present

## 2018-04-24 DIAGNOSIS — Z01419 Encounter for gynecological examination (general) (routine) without abnormal findings: Secondary | ICD-10-CM | POA: Diagnosis not present

## 2018-05-10 DIAGNOSIS — Z23 Encounter for immunization: Secondary | ICD-10-CM | POA: Diagnosis not present

## 2018-05-26 ENCOUNTER — Encounter: Payer: Self-pay | Admitting: *Deleted

## 2019-04-06 ENCOUNTER — Encounter: Payer: Self-pay | Admitting: Cardiovascular Disease

## 2019-04-10 ENCOUNTER — Ambulatory Visit (INDEPENDENT_AMBULATORY_CARE_PROVIDER_SITE_OTHER): Payer: Medicare Other | Admitting: Cardiovascular Disease

## 2019-04-10 ENCOUNTER — Encounter: Payer: Self-pay | Admitting: Cardiovascular Disease

## 2019-04-10 ENCOUNTER — Other Ambulatory Visit: Payer: Self-pay

## 2019-04-10 VITALS — BP 129/68 | HR 63 | Temp 98.2°F | Ht 61.5 in | Wt 177.2 lb

## 2019-04-10 DIAGNOSIS — G4733 Obstructive sleep apnea (adult) (pediatric): Secondary | ICD-10-CM

## 2019-04-10 DIAGNOSIS — E7800 Pure hypercholesterolemia, unspecified: Secondary | ICD-10-CM | POA: Diagnosis not present

## 2019-04-10 DIAGNOSIS — I10 Essential (primary) hypertension: Secondary | ICD-10-CM

## 2019-04-10 DIAGNOSIS — I480 Paroxysmal atrial fibrillation: Secondary | ICD-10-CM

## 2019-04-10 NOTE — Assessment & Plan Note (Signed)
History of essential hypertension with blood pressure measured today at 129/68.  She is on lisinopril, and hydrochlorothiazide, as well as metoprolol.

## 2019-04-10 NOTE — Assessment & Plan Note (Signed)
History of hyperlipidemia on pravastatin.  We will recheck a lipid liver profile

## 2019-04-10 NOTE — Assessment & Plan Note (Signed)
History of PAF in the past maintaining sinus rhythm. 

## 2019-04-10 NOTE — Progress Notes (Signed)
04/10/2019 Donna Sloan   04/26/1941  [id number]  Primary Physician Stephens Shire, MD Primary Cardiologist: Lorretta Harp MD [name], [place]  HPI:  Donna Sloan is a 78 y.o.  moderately overweight, married, Caucasian female mother of 2, grandmother to 2 grandchildren whose husband Donna Sloan  is also a patient of mine and who is accompanying her today. I last saw her  02/28/2018. She has a history of PAF, improved on low-dose beta blocker. Her other problems include obstructive sleep apnea; she never pursued a CPAP titration study after her initial diagnostic tests. She has hypertension and hyperlipidemia as well. She also has symptoms compatible with restless leg syndrome. Lower extremity Dopplers are normal.  Since I saw her a year ago she is remained stable.  She does admit to taking her pravastatin sporadically.  She denies chest pain or shortness of breath.   Current Meds  Medication Sig  . aspirin 81 MG tablet Take 81 mg by mouth daily.  [name] lisinopril-hydrochlorothiazide (PRINZIDE,ZESTORETIC) 20-12.5 MG tablet Take 1 tablet by mouth daily.  . metoprolol succinate (TOPROL-XL) 100 MG 24 hr tablet Take 1 tablet (100 mg total) by mouth daily.  [name] omeprazole (PRILOSEC) 20 MG capsule Take 1 capsule (20 mg total) by mouth daily.  . pravastatin (PRAVACHOL) 20 MG tablet Take 20 mg by mouth daily.  . pravastatin (PRAVACHOL) 20 MG tablet TAKE 1 TABLET(20 MG) BY MOUTH EVERY EVENING  . pravastatin (PRAVACHOL) 20 MG tablet TAKE 1 TABLET(20 MG) BY MOUTH EVERY EVENING     Allergies  Allergen Reactions  . Penicillins     Has patient had a PCN reaction causing immediate rash, facial/tongue/throat swelling, SOB or lightheadedness with hypotension: Yes Has patient had a PCN reaction causing severe rash involving mucus membranes or skin necrosis: No Has patient had a PCN reaction that required hospitalization: No Has patient had a PCN reaction occurring within the last 10 years: No  If all of the above answers are "NO", then may proceed with Cephalosporin use.  . Rosuvastatin Nausea And Vomiting  . Simvastatin Nausea And Vomiting    Social History   Socioeconomic History  . Marital status: Married    Spouse name: Not on file  . Number of children: Not on file  . Years of education: Not on file  . Highest education level: Not on file  Occupational History  . Not on file  Social Needs  . Financial resource strain: Not on file  . Food insecurity    Worry: Not on file    Inability: Not on file  . Transportation needs    Medical: Not on file    Non-medical: Not on file  Tobacco Use  . Smoking status: Never Smoker  . Smokeless tobacco: Never Used  Substance and Sexual Activity  . Alcohol use: No    Alcohol/week: 0.0 standard drinks  . Drug use: No  . Sexual activity: Not on file  Lifestyle  . Physical activity    Days per week: Not on file    Minutes per session: Not on file  . Stress: Not on file  Relationships  . Social Herbalist on phone: Not on file    Gets together: Not on file    Attends religious service: Not on file    Active member of club or organization: Not on file    Attends meetings of clubs or organizations: Not on file    Relationship status: Not  on file  . Intimate partner violence    Fear of current or ex partner: Not on file    Emotionally abused: Not on file    Physically abused: Not on file    Forced sexual activity: Not on file  Other Topics Concern  . Not on file  Social History Narrative   Lives with husband.  Has 2 sons.  Retired from [employer] working.  Education: high school.       Review of Systems: General: negative for chills, fever, night sweats or weight changes.  Cardiovascular: negative for chest pain, dyspnea on exertion, edema, orthopnea, palpitations, paroxysmal nocturnal dyspnea or shortness of breath Dermatological: negative for rash Respiratory: negative for cough or wheezing Urologic:  negative for hematuria Abdominal: negative for nausea, vomiting, diarrhea, bright red blood per rectum, melena, or hematemesis Neurologic: negative for visual changes, syncope, or dizziness All other systems reviewed and are otherwise negative except as noted above.    Blood pressure 129/68, pulse 63, temperature 98.2 F (36.8 C), height 5' 1.5" (1.562 m), weight 177 lb 3.2 oz (80.4 kg), SpO2 97 %.  General appearance: alert and no distress Neck: no adenopathy, no carotid bruit, no JVD, supple, symmetrical, trachea midline and thyroid not enlarged, symmetric, no tenderness/mass/nodules Lungs: clear to auscultation bilaterally Heart: regular rate and rhythm, S1, S2 normal, no murmur, click, rub or gallop Extremities: extremities normal, atraumatic, no cyanosis or edema Pulses: 2+ and symmetric Skin: Skin color, texture, turgor normal. No rashes or lesions Neurologic: Alert and oriented X 3, normal strength and tone. Normal symmetric reflexes. Normal coordination and gait  EKG normal sinus rhythm at 63 with nonspecific ST and T wave changes.  I personally reviewed this EKG.  ASSESSMENT AND PLAN:   PAF (paroxysmal atrial fibrillation) History of PAF in the past maintaining sinus rhythm  Essential hypertension History of essential hypertension with blood pressure measured today at 129/68.  She is on lisinopril, and hydrochlorothiazide, as well as metoprolol.  Hyperlipidemia History of hyperlipidemia on pravastatin.  We will recheck a lipid liver profile  Obstructive sleep apnea History of obstructive sleep apnea not on CPAP      Lorretta Harp MD [facility], [facility] 04/10/2019 3:43 PM

## 2019-04-10 NOTE — Patient Instructions (Signed)
Medication Instructions:  Your physician recommends that you continue on your current medications as directed. Please refer to the Current Medication list given to you today.  If you need a refill on your cardiac medications before your next appointment, please call your pharmacy.   Lab work: Your physician recommends that you return for lab work in: [place]  If you have labs (blood work) drawn today and your tests are completely normal, you will receive your results only by: [name] MyChart Message (if you have MyChart) OR . A paper copy in the mail If you have any lab test that is abnormal or we need to change your treatment, we will call you to review the results.  Testing/Procedures: NONE  Follow-Up: At [facility], you and your health needs are our priority.  As part of our continuing mission to provide you with exceptional heart care, we have created designated Provider Care Teams.  These Care Teams include your primary Cardiologist (physician) and Advanced Practice Providers (APPs -  Physician Assistants and Nurse Practitioners) who all work together to provide you with the care you need, when you need it. You will need a follow up appointment in 12 months.  Please call our office 2 months in advance to schedule this appointment.  You may see No primary care provider on file. or one of the following Advanced Practice Providers on your designated Care Team:   Kerin Ransom, PA-C [place], [place] . Sande Rives, PA-C  Any Other Special Instructions Will Be Listed Below (If Applicable).

## 2019-04-10 NOTE — Assessment & Plan Note (Signed)
History of obstructive sleep apnea not on CPAP. 

## 2020-05-14 ENCOUNTER — Encounter (HOSPITAL_COMMUNITY): Payer: Self-pay | Admitting: Internal Medicine

## 2020-05-14 ENCOUNTER — Emergency Department (HOSPITAL_COMMUNITY): Payer: Medicare Other

## 2020-05-14 ENCOUNTER — Other Ambulatory Visit: Payer: Self-pay

## 2020-05-14 ENCOUNTER — Inpatient Hospital Stay (HOSPITAL_COMMUNITY)
Admission: EM | Admit: 2020-05-14 | Discharge: 2020-05-25 | DRG: 234 | Disposition: A | Discharge status: Home / Self Care | Payer: Medicare Other | Attending: Thoracic Surgery (Cardiothoracic Vascular Surgery) | Admitting: Thoracic Surgery (Cardiothoracic Vascular Surgery)

## 2020-05-14 DIAGNOSIS — R1310 Dysphagia, unspecified: Secondary | ICD-10-CM | POA: Diagnosis present

## 2020-05-14 DIAGNOSIS — J90 Pleural effusion, not elsewhere classified: Secondary | ICD-10-CM

## 2020-05-14 DIAGNOSIS — I700 Atherosclerosis of aorta: Secondary | ICD-10-CM | POA: Diagnosis present

## 2020-05-14 DIAGNOSIS — E8770 Fluid overload, unspecified: Secondary | ICD-10-CM | POA: Diagnosis not present

## 2020-05-14 DIAGNOSIS — Z01810 Encounter for preprocedural cardiovascular examination: Secondary | ICD-10-CM | POA: Diagnosis not present

## 2020-05-14 DIAGNOSIS — Z01818 Encounter for other preprocedural examination: Secondary | ICD-10-CM

## 2020-05-14 DIAGNOSIS — Z888 Allergy status to other drugs, medicaments and biological substances status: Secondary | ICD-10-CM

## 2020-05-14 DIAGNOSIS — N179 Acute kidney failure, unspecified: Secondary | ICD-10-CM | POA: Diagnosis present

## 2020-05-14 DIAGNOSIS — I200 Unstable angina: Secondary | ICD-10-CM | POA: Diagnosis not present

## 2020-05-14 DIAGNOSIS — Z8542 Personal history of malignant neoplasm of other parts of uterus: Secondary | ICD-10-CM | POA: Diagnosis not present

## 2020-05-14 DIAGNOSIS — I25110 Atherosclerotic heart disease of native coronary artery with unstable angina pectoris: Secondary | ICD-10-CM | POA: Diagnosis present

## 2020-05-14 DIAGNOSIS — E861 Hypovolemia: Secondary | ICD-10-CM | POA: Diagnosis not present

## 2020-05-14 DIAGNOSIS — Z8249 Family history of ischemic heart disease and other diseases of the circulatory system: Secondary | ICD-10-CM

## 2020-05-14 DIAGNOSIS — Z951 Presence of aortocoronary bypass graft: Secondary | ICD-10-CM

## 2020-05-14 DIAGNOSIS — Z7982 Long term (current) use of aspirin: Secondary | ICD-10-CM | POA: Diagnosis not present

## 2020-05-14 DIAGNOSIS — E785 Hyperlipidemia, unspecified: Secondary | ICD-10-CM | POA: Diagnosis present

## 2020-05-14 DIAGNOSIS — Z880 Allergy status to penicillin: Secondary | ICD-10-CM

## 2020-05-14 DIAGNOSIS — R079 Chest pain, unspecified: Secondary | ICD-10-CM | POA: Diagnosis not present

## 2020-05-14 DIAGNOSIS — Z79899 Other long term (current) drug therapy: Secondary | ICD-10-CM

## 2020-05-14 DIAGNOSIS — I2510 Atherosclerotic heart disease of native coronary artery without angina pectoris: Secondary | ICD-10-CM | POA: Diagnosis not present

## 2020-05-14 DIAGNOSIS — K219 Gastro-esophageal reflux disease without esophagitis: Secondary | ICD-10-CM | POA: Diagnosis present

## 2020-05-14 DIAGNOSIS — I2584 Coronary atherosclerosis due to calcified coronary lesion: Secondary | ICD-10-CM | POA: Diagnosis present

## 2020-05-14 DIAGNOSIS — E876 Hypokalemia: Secondary | ICD-10-CM | POA: Diagnosis not present

## 2020-05-14 DIAGNOSIS — Z20822 Contact with and (suspected) exposure to covid-19: Secondary | ICD-10-CM | POA: Diagnosis present

## 2020-05-14 DIAGNOSIS — D696 Thrombocytopenia, unspecified: Secondary | ICD-10-CM | POA: Diagnosis not present

## 2020-05-14 DIAGNOSIS — Z6841 Body Mass Index (BMI) 40.0 and over, adult: Secondary | ICD-10-CM | POA: Diagnosis not present

## 2020-05-14 DIAGNOSIS — I361 Nonrheumatic tricuspid (valve) insufficiency: Secondary | ICD-10-CM | POA: Diagnosis not present

## 2020-05-14 DIAGNOSIS — N1832 Chronic kidney disease, stage 3b: Secondary | ICD-10-CM | POA: Diagnosis present

## 2020-05-14 DIAGNOSIS — G2581 Restless legs syndrome: Secondary | ICD-10-CM | POA: Diagnosis present

## 2020-05-14 DIAGNOSIS — I214 Non-ST elevation (NSTEMI) myocardial infarction: Secondary | ICD-10-CM | POA: Diagnosis present

## 2020-05-14 DIAGNOSIS — G4733 Obstructive sleep apnea (adult) (pediatric): Secondary | ICD-10-CM | POA: Diagnosis present

## 2020-05-14 DIAGNOSIS — I129 Hypertensive chronic kidney disease with stage 1 through stage 4 chronic kidney disease, or unspecified chronic kidney disease: Secondary | ICD-10-CM | POA: Diagnosis present

## 2020-05-14 DIAGNOSIS — I4892 Unspecified atrial flutter: Secondary | ICD-10-CM | POA: Diagnosis not present

## 2020-05-14 DIAGNOSIS — D62 Acute posthemorrhagic anemia: Secondary | ICD-10-CM | POA: Diagnosis not present

## 2020-05-14 DIAGNOSIS — N183 Chronic kidney disease, stage 3 unspecified: Secondary | ICD-10-CM | POA: Diagnosis present

## 2020-05-14 DIAGNOSIS — Z8679 Personal history of other diseases of the circulatory system: Secondary | ICD-10-CM

## 2020-05-14 DIAGNOSIS — I10 Essential (primary) hypertension: Secondary | ICD-10-CM | POA: Diagnosis not present

## 2020-05-14 DIAGNOSIS — Z90710 Acquired absence of both cervix and uterus: Secondary | ICD-10-CM

## 2020-05-14 LAB — CBC
HCT: 51.6 % — ABNORMAL HIGH (ref 36.0–46.0)
Hemoglobin: 16.1 g/dL — ABNORMAL HIGH (ref 12.0–15.0)
MCH: 29.2 pg (ref 26.0–34.0)
MCHC: 31.2 g/dL (ref 30.0–36.0)
MCV: 93.6 fL (ref 80.0–100.0)
Platelets: 170 10*3/uL (ref 150–400)
RBC: 5.51 MIL/uL — ABNORMAL HIGH (ref 3.87–5.11)
RDW: 13 % (ref 11.5–15.5)
WBC: 10.8 10*3/uL — ABNORMAL HIGH (ref 4.0–10.5)
nRBC: 0 % (ref 0.0–0.2)

## 2020-05-14 LAB — BASIC METABOLIC PANEL
Anion gap: 13 (ref 5–15)
BUN: 25 mg/dL — ABNORMAL HIGH (ref 8–23)
CO2: 21 mmol/L — ABNORMAL LOW (ref 22–32)
Calcium: 10.2 mg/dL (ref 8.9–10.3)
Chloride: 105 mmol/L (ref 98–111)
Creatinine, Ser: 1.24 mg/dL — ABNORMAL HIGH (ref 0.44–1.00)
GFR, Estimated: 42 mL/min — ABNORMAL LOW (ref >60)
Glucose, Bld: 114 mg/dL — ABNORMAL HIGH (ref 70–99)
Potassium: 3.9 mmol/L (ref 3.5–5.1)
Sodium: 139 mmol/L (ref 135–145)

## 2020-05-14 LAB — TROPONIN I (HIGH SENSITIVITY)
Troponin I (High Sensitivity): 259 ng/L (ref <18)
Troponin I (High Sensitivity): 932 ng/L (ref <18)
Troponin I (High Sensitivity): 4696 ng/L (ref <18)

## 2020-05-14 MED ORDER — SODIUM CHLORIDE 0.9 % WEIGHT BASED INFUSION
3.0000 mL/kg/h | INTRAVENOUS | Status: DC
  Administered 2020-05-15: 3 mL/kg/h via INTRAVENOUS

## 2020-05-14 MED ORDER — ASPIRIN 81 MG PO CHEW: 324.0000 mg | CHEWABLE_TABLET | ORAL | Status: AC

## 2020-05-14 MED ORDER — PANTOPRAZOLE SODIUM 40 MG PO TBEC
40.0000 mg | DELAYED_RELEASE_TABLET | Freq: Every day | ORAL | Status: DC
  Administered 2020-05-15 – 2020-05-19 (×5): 40 mg via ORAL
  Filled 2020-05-14 (×5): qty 1

## 2020-05-14 MED ORDER — SODIUM CHLORIDE 0.9 % IV BOLUS
1000.0000 mL | Freq: Once | INTRAVENOUS | Status: AC
  Administered 2020-05-14: 1000 mL via INTRAVENOUS

## 2020-05-14 MED ORDER — ACETAMINOPHEN 325 MG PO TABS
650.0000 mg | ORAL_TABLET | ORAL | Status: DC | PRN
  Administered 2020-05-15: 650 mg via ORAL
  Filled 2020-05-14: qty 2

## 2020-05-14 MED ORDER — ONDANSETRON HCL 4 MG/2ML IJ SOLN: 4.0000 mg | Freq: Four times a day (QID) | INTRAMUSCULAR | Status: DC | PRN

## 2020-05-14 MED ORDER — SODIUM CHLORIDE 0.9 % WEIGHT BASED INFUSION
1.0000 mL/kg/h | INTRAVENOUS | Status: DC
  Administered 2020-05-15: 1 mL/kg/h via INTRAVENOUS

## 2020-05-14 MED ORDER — PRAVASTATIN SODIUM 10 MG PO TABS: 20.0000 mg | ORAL_TABLET | Freq: Every day | ORAL | Status: DC

## 2020-05-14 MED ORDER — HEPARIN BOLUS VIA INFUSION
4000.0000 [IU] | Freq: Once | INTRAVENOUS | Status: AC
  Administered 2020-05-14: 4000 [IU] via INTRAVENOUS
  Filled 2020-05-14: qty 4000

## 2020-05-14 MED ORDER — NITROGLYCERIN 0.4 MG SL SUBL: 0.4000 mg | SUBLINGUAL_TABLET | SUBLINGUAL | Status: DC | PRN

## 2020-05-14 MED ORDER — ASPIRIN 300 MG RE SUPP: 300.0000 mg | RECTAL | Status: AC

## 2020-05-14 MED ORDER — SODIUM CHLORIDE 0.9% FLUSH
3.0000 mL | Freq: Two times a day (BID) | INTRAVENOUS | Status: DC
  Administered 2020-05-14: 3 mL via INTRAVENOUS

## 2020-05-14 MED ORDER — SODIUM CHLORIDE 0.9% FLUSH: 3.0000 mL | INTRAVENOUS | Status: DC | PRN

## 2020-05-14 MED ORDER — HEPARIN (PORCINE) 25000 UT/250ML-% IV SOLN
800.0000 [IU]/h | INTRAVENOUS | Status: DC
  Administered 2020-05-14: 800 [IU]/h via INTRAVENOUS

## 2020-05-14 MED ORDER — ASPIRIN EC 81 MG PO TBEC: 81.0000 mg | DELAYED_RELEASE_TABLET | Freq: Every day | ORAL | Status: DC

## 2020-05-14 MED ORDER — LISINOPRIL 5 MG PO TABS: 5.0000 mg | ORAL_TABLET | Freq: Every day | ORAL | Status: DC

## 2020-05-14 MED ORDER — METOPROLOL TARTRATE 25 MG PO TABS
25.0000 mg | ORAL_TABLET | Freq: Two times a day (BID) | ORAL | Status: DC
  Administered 2020-05-14 – 2020-05-19 (×11): 25 mg via ORAL
  Filled 2020-05-14 (×11): qty 1

## 2020-05-14 MED ORDER — SODIUM CHLORIDE 0.9 % IV SOLN: 250.0000 mL | INTRAVENOUS | Status: DC | PRN

## 2020-05-14 MED ORDER — ASPIRIN 81 MG PO CHEW
81.0000 mg | CHEWABLE_TABLET | ORAL | Status: AC
  Administered 2020-05-15: 81 mg via ORAL
  Filled 2020-05-14: qty 1

## 2020-05-14 NOTE — ED Triage Notes (Signed)
Pt with chest pain worse after eating x 1 week, worse over the last three days. N/v/d and near syncopal episode accompanies chest pain today despite pt having not eaten today. 324 ASA given PTA. Reports burning in chest after taking any PO medication so has not been taking any prescribed meds.

## 2020-05-14 NOTE — ED Provider Notes (Signed)
[facility] EMERGENCY DEPARTMENT Provider Note   CSN: [id number] Arrival date & time: 05/14/20  1308     History Chief Complaint  Patient presents with  . Chest Pain    Donna Sloan is a 79 y.o. female history of hypertension, hyperlipidemia, presenting with chest pain and indigestion. Patient states that she has a history of reflux.  She is currently on Prilosec.  Patient states that for the last week she has worsening indigestion after she eats.  She states that it is progressively getting worse despite taking her Prilosec.  She states that today she got the shower and had an episode of diaphoresis and almost passed out.  Her son called EMS and she came here for evaluation.  She was given aspirin 325 mg prior to arrival.  No current chest pain.  Patient follows up with Dr. Alvester Chou.  No previous cardiac stents.  The history is provided by the patient.       Past Medical History:  Diagnosis Date  . Chest pain    2D ECHO, 11/24/2010 - EF >55%, NUCLEAR STRESS TEST, 11/24/2010 - normal, no EKG changes for ischemia  . Claudication ([place])    LEA DUPLEX, 12/15/2010 - normal scan  . Endometrial adenocarcinoma ([place])    Stage I  . GERD (gastroesophageal reflux disease)   . Hyperlipidemia    statin intolerant  . Hypertension   . Urinary incontinence     Patient Active Problem List   Diagnosis Date Noted  . PAF (paroxysmal atrial fibrillation) ([place]) 01/15/2014  . Obstructive sleep apnea 01/04/2013  . Hyperlipidemia   . Endometrial/uterine adenocarcinoma ([place]) 11/11/2011  . Essential hypertension 09/15/2007  . TONSILLECTOMY, HX OF 09/15/2007  . CARPAL TUNNEL RELEASE, HX OF 09/15/2007  . DYSKINESIA OF ESOPHAGUS 03/03/2007  . MENOPAUSE, SURGICAL 11/01/2006    Past Surgical History:  Procedure Laterality Date  . BLADDER SUSPENSION    . CARPAL TUNNEL RELEASE    . LAPAROSCOPIC ASSISTED VAGINAL HYSTERECTOMY  12/2006   BSO with anterior colporrhaphy     OB History    No obstetric history on file.     Family History  Problem Relation Age of Onset  . Heart attack Father 81       Massive - deceased  . Heart attack Brother 57       Massive - deceased  . Heart attack Maternal Grandfather   . Kidney disease Maternal Grandmother   . Heart attack Paternal Grandfather     Social History   Tobacco Use  . Smoking status: Never Smoker  . Smokeless tobacco: Never Used  Vaping Use  . Vaping Use: Never used  Substance Use Topics  . Alcohol use: No    Alcohol/week: 0.0 standard drinks  . Drug use: No    Home Medications Prior to Admission medications   Medication Sig Start Date End Date Taking? Authorizing Provider  aspirin 81 MG tablet Take 81 mg by mouth daily.    [provider]  lisinopril-hydrochlorothiazide (PRINZIDE,ZESTORETIC) 20-12.5 MG tablet Take 1 tablet by mouth daily. 02/23/17   Lorretta Harp, MD  metoprolol succinate (TOPROL-XL) 100 MG 24 hr tablet Take 1 tablet (100 mg total) by mouth daily. 02/23/17   Lorretta Harp, MD  omeprazole (PRILOSEC) 20 MG capsule Take 1 capsule (20 mg total) by mouth daily. 02/23/17   Lorretta Harp, MD  pravastatin (PRAVACHOL) 20 MG tablet Take 20 mg by mouth daily.    [provider]  pravastatin (  PRAVACHOL) 20 MG tablet TAKE 1 TABLET(20 MG) BY MOUTH EVERY EVENING 04/20/18   Lorretta Harp, MD  pravastatin (PRAVACHOL) 20 MG tablet TAKE 1 TABLET(20 MG) BY MOUTH EVERY EVENING 04/20/18   Lorretta Harp, MD    Allergies    Penicillins, Rosuvastatin, and Simvastatin  Review of Systems   Review of Systems  Cardiovascular: Positive for chest pain.  All other systems reviewed and are negative.   Physical Exam Updated Vital Signs BP 106/83 (BP Location: Right Arm)   Pulse 74   Temp 98.4 F (36.9 C) (Oral)   Resp 20   SpO2 96%   Physical Exam Vitals and nursing note reviewed.  Constitutional:      Appearance: She is well-developed.  HENT:     Head: Normocephalic.    Eyes:     Pupils: Pupils are equal, round, and reactive to light.  Cardiovascular:     Rate and Rhythm: Normal rate and regular rhythm.     Heart sounds: Normal heart sounds.  Pulmonary:     Effort: Pulmonary effort is normal.     Breath sounds: Normal breath sounds.  Abdominal:     General: Bowel sounds are normal.     Palpations: Abdomen is soft.  Musculoskeletal:        General: Normal range of motion.     Cervical back: Normal range of motion and neck supple.  Skin:    General: Skin is warm.     Capillary Refill: Capillary refill takes less than 2 seconds.  Neurological:     General: No focal deficit present.     Mental Status: She is alert and oriented to person, place, and time.  Psychiatric:        Mood and Affect: Mood normal.        Behavior: Behavior normal.     ED Results / Procedures / Treatments   Labs (all labs ordered are listed, but only abnormal results are displayed) Labs Reviewed  BASIC METABOLIC PANEL - Abnormal; Notable for the following components:      Result Value   CO2 21 (*)    Glucose, Bld 114 (*)    BUN 25 (*)    Creatinine, Ser 1.24 (*)    GFR, Estimated 42 (*)    All other components within normal limits  CBC - Abnormal; Notable for the following components:   WBC 10.8 (*)    RBC 5.51 (*)    Hemoglobin 16.1 (*)    HCT 51.6 (*)    All other components within normal limits  TROPONIN I (HIGH SENSITIVITY) - Abnormal; Notable for the following components:   Troponin I (High Sensitivity) 259 (*)    All other components within normal limits  TROPONIN I (HIGH SENSITIVITY)    EKG None  Radiology DG Chest 2 View  Result Date: 05/14/2020 CLINICAL DATA:  Chest pain EXAM: CHEST - 2 VIEW COMPARISON:  April 10, 2014 FINDINGS: There is minimal scarring in the left base. Lungs elsewhere are clear. Heart size and pulmonary vascularity are normal. No adenopathy. There is mild degenerative change in the thoracic spine. IMPRESSION: Mild scarring  left base. No edema or airspace opacity. Heart size normal. Electronically Signed   By: Lowella Grip III M.D.   On: 05/14/2020 13:52    Procedures Procedures (including critical care time)  CRITICAL CARE Performed by: Wandra Arthurs   Total critical care time: 30 minutes  Critical care time was exclusive of separately billable procedures and  treating other patients.  Critical care was necessary to treat or prevent imminent or life-threatening deterioration.  Critical care was time spent personally by me on the following activities: development of treatment plan with patient and/or surrogate as well as nursing, discussions with consultants, evaluation of patient's response to treatment, examination of patient, obtaining history from patient or surrogate, ordering and performing treatments and interventions, ordering and review of laboratory studies, ordering and review of radiographic studies, pulse oximetry and re-evaluation of patient's condition.  Medications Ordered in ED Medications  sodium chloride 0.9 % bolus 1,000 mL (has no administration in time range)    ED Course  I have reviewed the triage vital signs and the nursing notes.  Pertinent labs & imaging results that were available during my care of the patient were reviewed by me and considered in my medical decision making (see chart for details).    MDM Rules/Calculators/A&P                         Donna Sloan is a 79 y.o. female presenting with chest pain and indigestion.  Consider reflux versus ACS.  Plan to get CBC, BMP, Trop.   3:44 PM Trop is 259. No obvious STEMI. Concerned for NSTEMI. Started on heparin. Cardiology to see patient   5:05 PM Cardiology to admit for NSTEMI. Repeat Trop up to 900 now. Remains pain free. On heparin drip now   Final Clinical Impression(s) / ED Diagnoses Final diagnoses:  None    Rx / DC Orders ED Discharge Orders    None       Drenda Freeze, MD 05/14/20  1706

## 2020-05-14 NOTE — Progress Notes (Signed)
ANTICOAGULATION CONSULT NOTE - Initial Consult  Pharmacy Consult for heparin Indication: chest pain/ACS  Allergies  Allergen Reactions  . Penicillins     Has patient had a PCN reaction causing immediate rash, facial/tongue/throat swelling, SOB or lightheadedness with hypotension: Yes Has patient had a PCN reaction causing severe rash involving mucus membranes or skin necrosis: No Has patient had a PCN reaction that required hospitalization: No Has patient had a PCN reaction occurring within the last 10 years: No If all of the above answers are "NO", then may proceed with Cephalosporin use.  . Rosuvastatin Nausea And Vomiting  . Simvastatin Nausea And Vomiting    Patient Measurements: Height: 5' 1.5" (156.2 cm) Weight: 81.6 kg (180 lb) IBW/kg (Calculated) : 48.95 Heparin Dosing Weight: 67.3kg  Vital Signs: Temp: 98.4 F (36.9 C) (10/13 1502) Temp Source: Oral (10/13 1502) BP: 140/82 (10/13 1545) Pulse Rate: 68 (10/13 1545)  Labs: Recent Labs    05/14/20 1308  HGB 16.1*  HCT 51.6*  PLT 170  CREATININE 1.24*  TROPONINIHS 259*    Estimated Creatinine Clearance: 36.6 mL/min (A) (by C-G formula based on SCr of 1.24 mg/dL (H)).   Medical History: Past Medical History:  Diagnosis Date  . Chest pain    2D ECHO, 11/24/2010 - EF >55%, NUCLEAR STRESS TEST, 11/24/2010 - normal, no EKG changes for ischemia  . Claudication ([place])    LEA DUPLEX, 12/15/2010 - normal scan  . Endometrial adenocarcinoma ([place])    Stage I  . GERD (gastroesophageal reflux disease)   . Hyperlipidemia    statin intolerant  . Hypertension   . Urinary incontinence    Assessment: 37 YOF presenting with CP, elevated troponin and concern for NSTEMI, not on anticoagulation PTA.    Goal of Therapy:  Heparin level 0.3-0.7 units/ml Monitor platelets by anticoagulation protocol: Yes   Plan:  Heparin 4000 units IV x 1, and gtt at 800 units/hr F/u 8 hour heparin level Daily heparin level, CBC, s/s  bleeding F/u Cards plan  Bertis Ruddy, PharmD Clinical Pharmacist ED Pharmacist Phone # [phone] 05/14/2020 3:57 PM

## 2020-05-14 NOTE — ED Notes (Signed)
Attempted to start 2nd IV x 2

## 2020-05-14 NOTE — H&P (Addendum)
Cardiology History and Physical:   Patient ID: Donna Sloan MRN: [id number]; DOB: [birth year]  Admit date: 05/14/2020 Date of Consult: 05/14/2020  Primary Care Provider: Stephens Shire, MD [facility] Cardiologist: Quay Burow, MD  [facility] Electrophysiologist:  None    Patient Profile:   Donna Sloan is a 79 y.o. female with a hx of PAF not on AC, OSA not on CPAP, HTN, HLD, and RLS who is being seen today for the evaluation of chest pain at the request of Dr. Darl Householder.  History of Present Illness:   Donna Sloan was last seen in clinic by Dr. Gwenlyn Found 04/10/19 and was doing well at that time. She was maintaining sinus rhythm and not on anticoagulation. HTN controlled on lisinopril, HCTZ, and BB. On pravastatin for HLD, but takes sporadically. She did not pursue titration studies for CPAP after he diagnostic sleep study.   She presented to [facility] with complaints of chest pain and indigestion. She has a history of GERD and has treated her chest discomfort with prilosec without relief. She reports worsening indigestion after eating. She also had a near syncopal episode after getting out of the shower and becoming diaphoretic.   sCr 1.24 HS troponin 259->952 WBC 10.8  EKG with sinus rhythm, HR 72, repolarization abnormality appears stable from prior.  On my interview, the patient reports that she has been taking care of her disabled husband and stopped taking all of her cardiac medications a year ago. She also does not take her BP at home, but assumes it has been high. She reports that starting one week ago, she had chest pain in the center of her chest after she eats, drinks, or swallows. The pain is a burning sensation in the center of her chest. The pain does not occur with rest or exertion. Over the last two days, the pain has been more persistent - pain lasts longer after swallowing. Not relieved by prilosec.   She also does not remember the conditions surrounding her PAF, but I  do not see a recent recurrence.   Past Medical History:  Diagnosis Date   Chest pain    2D ECHO, 11/24/2010 - EF >55%, NUCLEAR STRESS TEST, 11/24/2010 - normal, no EKG changes for ischemia   Claudication ([place])    LEA DUPLEX, 12/15/2010 - normal scan   Endometrial adenocarcinoma (HCC)    Stage I   GERD (gastroesophageal reflux disease)    Hyperlipidemia    statin intolerant   Hypertension    Urinary incontinence     Past Surgical History:  Procedure Laterality Date   BLADDER SUSPENSION     CARPAL TUNNEL RELEASE     LAPAROSCOPIC ASSISTED VAGINAL HYSTERECTOMY  12/2006   BSO with anterior colporrhaphy     Home Medications:  Prior to Admission medications   Medication Sig Start Date End Date Taking? Authorizing Provider  aspirin 81 MG tablet Take 81 mg by mouth daily.    [provider]  lisinopril-hydrochlorothiazide (PRINZIDE,ZESTORETIC) 20-12.5 MG tablet Take 1 tablet by mouth daily. 02/23/17   Lorretta Harp, MD  metoprolol succinate (TOPROL-XL) 100 MG 24 hr tablet Take 1 tablet (100 mg total) by mouth daily. 02/23/17   Lorretta Harp, MD  omeprazole (PRILOSEC) 20 MG capsule Take 1 capsule (20 mg total) by mouth daily. 02/23/17   Lorretta Harp, MD  pravastatin (PRAVACHOL) 20 MG tablet Take 20 mg by mouth daily.    [provider]  pravastatin (PRAVACHOL) 20 MG tablet TAKE  1 TABLET(20 MG) BY MOUTH EVERY EVENING 04/20/18   Lorretta Harp, MD  pravastatin (PRAVACHOL) 20 MG tablet TAKE 1 TABLET(20 MG) BY MOUTH EVERY EVENING 04/20/18   Lorretta Harp, MD    Inpatient Medications: Scheduled Meds:  aspirin EC  81 mg Oral Daily   heparin  4,000 Units Intravenous Once   lisinopril  5 mg Oral Daily   metoprolol tartrate  25 mg Oral BID   Continuous Infusions:  heparin     sodium chloride     PRN Meds:   Allergies:    Allergies  Allergen Reactions   Penicillins     Has patient had a PCN reaction causing immediate rash, facial/tongue/throat  swelling, SOB or lightheadedness with hypotension: Yes Has patient had a PCN reaction causing severe rash involving mucus membranes or skin necrosis: No Has patient had a PCN reaction that required hospitalization: No Has patient had a PCN reaction occurring within the last 10 years: No If all of the above answers are "NO", then may proceed with Cephalosporin use.   Rosuvastatin Nausea And Vomiting   Simvastatin Nausea And Vomiting    Social History:   Social History   Socioeconomic History   Marital status: Married    Spouse name: Not on file   Number of children: Not on file   Years of education: Not on file   Highest education level: Not on file  Occupational History   Not on file  Tobacco Use   Smoking status: Never Smoker   Smokeless tobacco: Never Used  Vaping Use   Vaping Use: Never used  Substance and Sexual Activity   Alcohol use: No    Alcohol/week: 0.0 standard drinks   Drug use: No   Sexual activity: Not on file  Other Topics Concern   Not on file  Social History Narrative   Lives with husband.  Has 2 sons.  Retired from [employer] working.  Education: high school.     Social Determinants of Health   Financial Resource Strain:    Difficulty of Paying Living Expenses: Not on file  Food Insecurity:    Worried About Charity fundraiser in the Last Year: Not on file   [employer] of Food in the Last Year: Not on file  Transportation Needs:    Lack of Transportation (Medical): Not on file   Lack of Transportation (Non-Medical): Not on file  Physical Activity:    Days of Exercise per Week: Not on file   Minutes of Exercise per Session: Not on file  Stress:    Feeling of Stress : Not on file  Social Connections:    Frequency of Communication with Friends and Family: Not on file   Frequency of Social Gatherings with Friends and Family: Not on file   Attends Religious Services: Not on file   Active Member of Clubs or Organizations: Not on file   Attends Theatre manager Meetings: Not on file   Marital Status: Not on file  Intimate Partner Violence:    Fear of Current or Ex-Partner: Not on file   Emotionally Abused: Not on file   Physically Abused: Not on file   Sexually Abused: Not on file    Family History:    Family History  Problem Relation Age of Onset   Heart attack Father 46       Massive - deceased   Heart attack Brother 49       Massive - deceased   Heart  attack Maternal Grandfather    Kidney disease Maternal Grandmother    Heart attack Paternal Grandfather      ROS:  Please see the history of present illness.   All other ROS reviewed and negative.     Physical Exam/Data:   Vitals:   05/14/20 1433 05/14/20 1502 05/14/20 1542 05/14/20 1545  BP: 109/72 106/83  140/82  Pulse: 84 74  68  Resp: 16 20  20   Temp:  98.4 F (36.9 C)    TempSrc:  Oral    SpO2: 100% 96%  100%  Weight:   81.6 kg   Height:   5' 1.5" (1.562 m)    No intake or output data in the 24 hours ending 05/14/20 1654 Last 3 Weights 05/14/2020 04/10/2019 02/28/2018  Weight (lbs) 180 lb 177 lb 3.2 oz 170 lb  Weight (kg) 81.647 kg 80.377 kg 77.111 kg     Body mass index is 33.46 kg/m.  General:  Well nourished, well developed, in no acute distress HEENT: normal Neck: no JVD Vascular: No carotid bruits Cardiac:  normal S1, S2; RRR; no murmur Lungs:  clear to auscultation bilaterally, no wheezing, rhonchi or rales  Abd: soft, nontender, no hepatomegaly  Ext: no edema Musculoskeletal:  No deformities, BUE and BLE strength normal and equal Skin: warm and dry  Neuro:  CNs 2-12 intact, no focal abnormalities noted Psych:  Normal affect   EKG:  The EKG was personally reviewed and demonstrates:  Sinus rhythm HR 72 with repolarization abnormality Telemetry:  Telemetry was personally reviewed and demonstrates:  Sinus rhythm in the 70s  Relevant CV Studies:  none  Laboratory Data:  High Sensitivity Troponin:   Recent Labs  Lab 05/14/20 1308    TROPONINIHS 259*     Chemistry Recent Labs  Lab 05/14/20 1308  NA 139  K 3.9  CL 105  CO2 21*  GLUCOSE 114*  BUN 25*  CREATININE 1.24*  CALCIUM 10.2  GFRNONAA 42*  ANIONGAP 13    Hematology Recent Labs  Lab 05/14/20 1308  WBC 10.8*  RBC 5.51*  HGB 16.1*  HCT 51.6*  MCV 93.6  MCH 29.2  MCHC 31.2  RDW 13.0  PLT 170   Radiology/Studies:  DG Chest 2 View  Result Date: 05/14/2020 CLINICAL DATA:  Chest pain EXAM: CHEST - 2 VIEW COMPARISON:  April 10, 2014 FINDINGS: There is minimal scarring in the left base. Lungs elsewhere are clear. Heart size and pulmonary vascularity are normal. No adenopathy. There is mild degenerative change in the thoracic spine. IMPRESSION: Mild scarring left base. No edema or airspace opacity. Heart size normal. Electronically Signed   By: Lowella Grip III M.D.   On: 05/14/2020 13:52   Assessment and Plan:   Chest pain Elevated troponin - hs troponin 259- 900+ - EKG does not appear acutely ischemic - Chest pain worse after eating, but not relieved by Prilosec, not exertional --> but has progressed to lasting longer after eating - risk factors include HTN, HLD, obesity, and untreated OSA - Will rule out obstructive disease - Load ASA, then 81 mg ASA and lopressor - Start anticoagulation  Near syncope Nausea and vomiting - N/V started last night - near syncope this morning after using the bathroom, associated with diaphoresis but not chest pain - continue to monitor on telemetry  Hypertension - stopped taking all medications about a year ago - pressure was in the 626R systolic on my exam - would restart lisinopril  PAF - has not  had a recurrence since prior to 2014 - continue BB - has not been anticoagulated   Hyperlipidemia - has not been taking statin - would restart low dose of pravachol - check fasting lipids tomorrow morning  AKI - sCr 1.24 - may be driven by uncontrolled HTN  Burning pain after eating and  trouble swallowing food - would consider GI consult regardless of cardiac workup   Complete the following score calculators/questions which help meet required metrics.  Press F2         :[id number]}    HEAR Score (for undifferentiated chest pain):  HEAR Score: 5            For questions or updates, please contact [place] Please consult [email or web address] for contact info under    Signed, Ledora Bottcher, PA  05/14/2020 4:54 PM  Personally seen and examined. Agree with APP Provider above with the following comments:  79 yo F with unstable angina (atypical burning pain with exertion improved with rest).  Also with dysphagia.  Has hx of PAF. - ASA load, ASA, heparin drip, Echo, and plans for LHC possible PCI in 10/14 NPO at midnight. Risks and benefits of cardiac catheterization have been discussed with the patient.  These include bleeding, infection, kidney damage, stroke, heart attack, death.  The patient understands these risks and is willing to proceed. R Radial +2 no contrast allergy noted per patient; amenable to blood if needed

## 2020-05-15 ENCOUNTER — Inpatient Hospital Stay (HOSPITAL_COMMUNITY): Payer: Medicare Other

## 2020-05-15 ENCOUNTER — Encounter (HOSPITAL_COMMUNITY)
Admission: EM | Disposition: A | Discharge status: Home / Self Care | Payer: Self-pay | Source: Home / Self Care | Attending: Internal Medicine

## 2020-05-15 ENCOUNTER — Encounter (HOSPITAL_COMMUNITY): Payer: Self-pay | Admitting: Cardiovascular Disease

## 2020-05-15 DIAGNOSIS — R079 Chest pain, unspecified: Secondary | ICD-10-CM

## 2020-05-15 DIAGNOSIS — I214 Non-ST elevation (NSTEMI) myocardial infarction: Secondary | ICD-10-CM

## 2020-05-15 DIAGNOSIS — Z01810 Encounter for preprocedural cardiovascular examination: Secondary | ICD-10-CM | POA: Diagnosis not present

## 2020-05-15 DIAGNOSIS — I25110 Atherosclerotic heart disease of native coronary artery with unstable angina pectoris: Secondary | ICD-10-CM

## 2020-05-15 DIAGNOSIS — I361 Nonrheumatic tricuspid (valve) insufficiency: Secondary | ICD-10-CM

## 2020-05-15 DIAGNOSIS — I2510 Atherosclerotic heart disease of native coronary artery without angina pectoris: Secondary | ICD-10-CM | POA: Diagnosis not present

## 2020-05-15 HISTORY — PX: LEFT HEART CATH AND CORONARY ANGIOGRAPHY: CATH118249

## 2020-05-15 LAB — CBC
HCT: 41.7 % (ref 36.0–46.0)
Hemoglobin: 13.7 g/dL (ref 12.0–15.0)
MCH: 30 pg (ref 26.0–34.0)
MCHC: 32.9 g/dL (ref 30.0–36.0)
MCV: 91.4 fL (ref 80.0–100.0)
Platelets: 162 10*3/uL (ref 150–400)
RBC: 4.56 MIL/uL (ref 3.87–5.11)
RDW: 13.1 % (ref 11.5–15.5)
WBC: 9 10*3/uL (ref 4.0–10.5)
nRBC: 0 % (ref 0.0–0.2)

## 2020-05-15 LAB — LIPID PANEL
Cholesterol: 208 mg/dL — ABNORMAL HIGH (ref 0–200)
HDL: 55 mg/dL (ref >40)
LDL Cholesterol: 136 mg/dL — ABNORMAL HIGH (ref 0–99)
Total CHOL/HDL Ratio: 3.8 RATIO
Triglycerides: 84 mg/dL (ref <150)
VLDL: 17 mg/dL (ref 0–40)

## 2020-05-15 LAB — BASIC METABOLIC PANEL
Anion gap: 8 (ref 5–15)
BUN: 23 mg/dL (ref 8–23)
CO2: 23 mmol/L (ref 22–32)
Calcium: 9.3 mg/dL (ref 8.9–10.3)
Chloride: 108 mmol/L (ref 98–111)
Creatinine, Ser: 1.07 mg/dL — ABNORMAL HIGH (ref 0.44–1.00)
GFR, Estimated: 50 mL/min — ABNORMAL LOW (ref >60)
Glucose, Bld: 100 mg/dL — ABNORMAL HIGH (ref 70–99)
Potassium: 3.7 mmol/L (ref 3.5–5.1)
Sodium: 139 mmol/L (ref 135–145)

## 2020-05-15 LAB — HEPARIN LEVEL (UNFRACTIONATED)
Heparin Unfractionated: 0.38 IU/mL (ref 0.30–0.70)
Heparin Unfractionated: 0.24 IU/mL — ABNORMAL LOW (ref 0.30–0.70)

## 2020-05-15 LAB — SARS CORONAVIRUS 2 BY RT PCR (HOSPITAL ORDER, PERFORMED IN ~~LOC~~ HOSPITAL LAB): SARS Coronavirus 2: NEGATIVE

## 2020-05-15 SURGERY — LEFT HEART CATH AND CORONARY ANGIOGRAPHY
Anesthesia: LOCAL

## 2020-05-15 MED ORDER — HYDROCHLOROTHIAZIDE 12.5 MG PO CAPS: 12.5000 mg | ORAL_CAPSULE | Freq: Every day | ORAL | Status: DC

## 2020-05-15 MED ORDER — HEPARIN (PORCINE) 25000 UT/250ML-% IV SOLN
1000.0000 [IU]/h | INTRAVENOUS | Status: DC
  Administered 2020-05-15: 800 [IU]/h via INTRAVENOUS
  Administered 2020-05-16 – 2020-05-17 (×2): 900 [IU]/h via INTRAVENOUS
  Administered 2020-05-19: 1000 [IU]/h via INTRAVENOUS
  Filled 2020-05-15 (×4): qty 250

## 2020-05-15 MED ORDER — HEPARIN (PORCINE) IN NACL 1000-0.9 UT/500ML-% IV SOLN
INTRAVENOUS | Status: AC
  Filled 2020-05-15: qty 500

## 2020-05-15 MED ORDER — SODIUM CHLORIDE 0.9% FLUSH
3.0000 mL | Freq: Two times a day (BID) | INTRAVENOUS | Status: DC
  Administered 2020-05-15 – 2020-05-19 (×8): 3 mL via INTRAVENOUS

## 2020-05-15 MED ORDER — ISOSORBIDE MONONITRATE ER 60 MG PO TB24: 60.0000 mg | ORAL_TABLET | Freq: Every day | ORAL | Status: DC

## 2020-05-15 MED ORDER — HEPARIN (PORCINE) IN NACL 1000-0.9 UT/500ML-% IV SOLN
INTRAVENOUS | Status: DC | PRN
  Administered 2020-05-15 (×2): 500 mL

## 2020-05-15 MED ORDER — HYDRALAZINE HCL 20 MG/ML IJ SOLN
INTRAMUSCULAR | Status: DC | PRN
  Administered 2020-05-15 (×3): 5 mg via INTRAVENOUS

## 2020-05-15 MED ORDER — HYDRALAZINE HCL 20 MG/ML IJ SOLN
INTRAMUSCULAR | Status: AC
  Filled 2020-05-15: qty 1

## 2020-05-15 MED ORDER — ATORVASTATIN CALCIUM 80 MG PO TABS
80.0000 mg | ORAL_TABLET | Freq: Every day | ORAL | Status: DC
  Administered 2020-05-15 – 2020-05-19 (×5): 80 mg via ORAL
  Filled 2020-05-15 (×5): qty 1

## 2020-05-15 MED ORDER — HEPARIN SODIUM (PORCINE) 1000 UNIT/ML IJ SOLN
INTRAMUSCULAR | Status: DC | PRN
  Administered 2020-05-15: 4000 [IU] via INTRAVENOUS

## 2020-05-15 MED ORDER — LIDOCAINE HCL (PF) 1 % IJ SOLN
INTRAMUSCULAR | Status: DC | PRN
  Administered 2020-05-15: 2 mL

## 2020-05-15 MED ORDER — MORPHINE SULFATE (PF) 2 MG/ML IV SOLN: 2.0000 mg | INTRAVENOUS | Status: DC | PRN

## 2020-05-15 MED ORDER — ALUM & MAG HYDROXIDE-SIMETH 200-200-20 MG/5ML PO SUSP
30.0000 mL | Freq: Four times a day (QID) | ORAL | Status: DC | PRN
  Administered 2020-05-15 – 2020-05-16 (×2): 30 mL via ORAL
  Filled 2020-05-15 (×2): qty 30

## 2020-05-15 MED ORDER — PANTOPRAZOLE SODIUM 40 MG PO TBEC: 40.0000 mg | DELAYED_RELEASE_TABLET | Freq: Every day | ORAL | Status: DC

## 2020-05-15 MED ORDER — LABETALOL HCL 5 MG/ML IV SOLN: 10.0000 mg | INTRAVENOUS | Status: AC | PRN

## 2020-05-15 MED ORDER — IOHEXOL 350 MG/ML SOLN
INTRAVENOUS | Status: DC | PRN
  Administered 2020-05-15: 70 mL via INTRACARDIAC

## 2020-05-15 MED ORDER — LIDOCAINE HCL (PF) 1 % IJ SOLN
INTRAMUSCULAR | Status: AC
  Filled 2020-05-15: qty 30

## 2020-05-15 MED ORDER — ASPIRIN 81 MG PO CHEW
81.0000 mg | CHEWABLE_TABLET | Freq: Every day | ORAL | Status: DC
  Administered 2020-05-16 – 2020-05-19 (×4): 81 mg via ORAL
  Filled 2020-05-15 (×5): qty 1

## 2020-05-15 MED ORDER — ACETAMINOPHEN 325 MG PO TABS: 650.0000 mg | ORAL_TABLET | ORAL | Status: DC | PRN

## 2020-05-15 MED ORDER — SODIUM CHLORIDE 0.9% FLUSH: 3.0000 mL | INTRAVENOUS | Status: DC | PRN

## 2020-05-15 MED ORDER — VERAPAMIL HCL 2.5 MG/ML IV SOLN
INTRAVENOUS | Status: DC | PRN
  Administered 2020-05-15: 15 mL via INTRA_ARTERIAL

## 2020-05-15 MED ORDER — METOPROLOL SUCCINATE ER 100 MG PO TB24: 100.0000 mg | ORAL_TABLET | Freq: Every day | ORAL | Status: DC

## 2020-05-15 MED ORDER — LISINOPRIL 20 MG PO TABS: 20.0000 mg | ORAL_TABLET | Freq: Every day | ORAL | Status: DC

## 2020-05-15 MED ORDER — ONDANSETRON HCL 4 MG/2ML IJ SOLN: 4.0000 mg | Freq: Four times a day (QID) | INTRAMUSCULAR | Status: DC | PRN

## 2020-05-15 MED ORDER — NITROGLYCERIN IN D5W 200-5 MCG/ML-% IV SOLN
INTRAVENOUS | Status: AC
  Filled 2020-05-15: qty 250

## 2020-05-15 MED ORDER — LISINOPRIL-HYDROCHLOROTHIAZIDE 20-12.5 MG PO TABS: 1.0000 | ORAL_TABLET | Freq: Every day | ORAL | Status: DC

## 2020-05-15 MED ORDER — SODIUM CHLORIDE 0.9 % IV SOLN: 250.0000 mL | INTRAVENOUS | Status: DC | PRN

## 2020-05-15 MED ORDER — VERAPAMIL HCL 2.5 MG/ML IV SOLN
INTRAVENOUS | Status: AC
  Filled 2020-05-15: qty 2

## 2020-05-15 MED ORDER — HEPARIN SODIUM (PORCINE) 1000 UNIT/ML IJ SOLN
INTRAMUSCULAR | Status: AC
  Filled 2020-05-15: qty 1

## 2020-05-15 MED ORDER — SODIUM CHLORIDE 0.9 % IV SOLN: INTRAVENOUS | Status: AC

## 2020-05-15 MED ORDER — HYDRALAZINE HCL 20 MG/ML IJ SOLN: 10.0000 mg | INTRAMUSCULAR | Status: DC | PRN

## 2020-05-15 MED ORDER — LISINOPRIL 5 MG PO TABS
5.0000 mg | ORAL_TABLET | Freq: Every day | ORAL | Status: DC
  Administered 2020-05-15 – 2020-05-16 (×2): 5 mg via ORAL
  Filled 2020-05-15 (×2): qty 1

## 2020-05-15 SURGICAL SUPPLY — 12 items
CATH INFINITI 5FR ANG PIGTAIL (CATHETERS) ×1 IMPLANT
CATH OPTITORQUE TIG 4.0 5F (CATHETERS) ×1 IMPLANT
DEVICE RAD COMP TR BAND LRG (VASCULAR PRODUCTS) ×1 IMPLANT
GLIDESHEATH SLEND A-KIT 6F 22G (SHEATH) ×1 IMPLANT
GUIDEWIRE INQWIRE 1.5J.035X260 (WIRE) IMPLANT
INQWIRE 1.5J .035X260CM (WIRE) ×2
KIT HEART LEFT (KITS) ×2 IMPLANT
PACK CARDIAC CATHETERIZATION (CUSTOM PROCEDURE TRAY) ×2 IMPLANT
SYR MEDRAD MARK 7 150ML (SYRINGE) ×2 IMPLANT
TRANSDUCER W/STOPCOCK (MISCELLANEOUS) ×2 IMPLANT
TUBING CIL FLEX 10 FLL-RA (TUBING) ×2 IMPLANT
WIRE HI TORQ VERSACORE-J 145CM (WIRE) ×1 IMPLANT

## 2020-05-15 NOTE — Progress Notes (Signed)
[place] for heparin Indication: chest pain/ACS  Allergies  Allergen Reactions  . Penicillins     Has patient had a PCN reaction causing immediate rash, facial/tongue/throat swelling, SOB or lightheadedness with hypotension: Yes Has patient had a PCN reaction causing severe rash involving mucus membranes or skin necrosis: No Has patient had a PCN reaction that required hospitalization: No Has patient had a PCN reaction occurring within the last 10 years: No If all of the above answers are "NO", then may proceed with Cephalosporin use.  . Rosuvastatin Nausea And Vomiting  . Simvastatin Nausea And Vomiting    Patient Measurements: Height: 5\' 2"  (157.5 cm) Weight: 85.3 kg (188 lb 1.6 oz) IBW/kg (Calculated) : 50.1 Heparin Dosing Weight: 67.3kg  Vital Signs: Temp: 98.5 F (36.9 C) (10/14 0423) Temp Source: Oral (10/14 0423) BP: 131/55 (10/14 0423) Pulse Rate: 80 (10/14 0423)  Labs: Recent Labs    05/14/20 1308 05/14/20 1545 05/15/20 0356  HGB 16.1*  --  13.7  HCT 51.6*  --  41.7  PLT 170  --  162  HEPARINUNFRC  --   --  0.38  CREATININE 1.24*  --   --   TROPONINIHS 259* 932*  --     Estimated Creatinine Clearance: 37.9 mL/min (A) (by C-G formula based on SCr of 1.24 mg/dL (H)).  Assessment: 84 YOF presenting with CP, elevated troponin and concern for NSTEMI, not on anticoagulation PTA.    Heparin level therapeutic (0.38) on gtt at 800 units/hr. No bleeding noted.  Goal of Therapy:  Heparin level 0.3-0.7 units/ml Monitor platelets by anticoagulation protocol: Yes   Plan:  Continue Heparin at 800 units/hr F/u 6 hr confirmatory heparin level  Sherlon Handing, PharmD, BCPS Please see amion for complete clinical pharmacist phone list 05/15/2020 4:58 AM

## 2020-05-15 NOTE — Progress Notes (Signed)
[place] for heparin Indication: NSTEMI, multivessel CAD  Allergies  Allergen Reactions  . Penicillins     Has patient had a PCN reaction causing immediate rash, facial/tongue/throat swelling, SOB or lightheadedness with hypotension: Yes Has patient had a PCN reaction causing severe rash involving mucus membranes or skin necrosis: No Has patient had a PCN reaction that required hospitalization: No Has patient had a PCN reaction occurring within the last 10 years: No If all of the above answers are "NO", then may proceed with Cephalosporin use.  . Rosuvastatin Nausea And Vomiting  . Simvastatin Nausea And Vomiting    Patient Measurements: Height: 5\' 2"  (157.5 cm) Weight: 85.3 kg (188 lb 1.6 oz) IBW/kg (Calculated) : 50.1 Heparin Dosing Weight: 67.3kg  Vital Signs: Temp: 98.5 F (36.9 C) (10/14 0423) Temp Source: Oral (10/14 0423) BP: 160/59 (10/14 0845) Pulse Rate: 102 (10/14 0845)  Labs: Recent Labs    05/14/20 1308 05/14/20 1545 05/14/20 2113 05/15/20 0356  HGB 16.1*  --   --  13.7  HCT 51.6*  --   --  41.7  PLT 170  --   --  162  HEPARINUNFRC  --   --   --  0.38  CREATININE 1.24*  --   --  1.07*  TROPONINIHS 259* 932* 4,696*  --     Estimated Creatinine Clearance: 43.9 mL/min (A) (by C-G formula based on SCr of 1.07 mg/dL (H)).  Assessment: 68 YOF presenting with CP, elevated troponin and concern for NSTEMI, not on anticoagulation PTA. She is s/p cath and shown to have multivessel CAD - TCTS consulted for CABG. Pharmacy consulted to resume IV heparin 4h post TR band removal. Expect TR band to be removed ~12:00.  Last heparin level was therapeutic (0.38) at 800 units/hr.   Goal of Therapy:  Heparin level 0.3-0.7 units/ml Monitor platelets by anticoagulation protocol: Yes   Plan:  Resume heparin drip at 800 units/hr at 16:00 6h heparin level Daily heparin level, CBC Monitor for s/sx of bleeding  Thank you for  involving pharmacy in this patient's care.  Renold Genta, PharmD, BCPS Clinical Pharmacist Clinical phone for 05/15/2020 until 3p is [phone] 05/15/2020 9:10 AM  **Pharmacist phone directory can be found on [email or web address] listed under [place]**

## 2020-05-15 NOTE — Progress Notes (Signed)
ANTICOAGULATION CONSULT NOTE - Follow Up Consult  Pharmacy Consult for heparin Indication: CAD awaiting CABG  Labs: Recent Labs    05/14/20 1308 05/14/20 1545 05/14/20 2113 05/15/20 0356 05/15/20 2157  HGB 16.1*  --   --  13.7  --   HCT 51.6*  --   --  41.7  --   PLT 170  --   --  162  --   HEPARINUNFRC  --   --   --  0.38 0.24*  CREATININE 1.24*  --   --  1.07*  --   TROPONINIHS 259* 932* 4,696*  --   --     Assessment: 79yo female subtherapeutic on heparin after resumed post-cath; no gtt issues or signs of bleeding per RN.  Goal of Therapy:  Heparin level 0.3-0.7 units/ml   Plan:  Will increase heparin gtt by ~1 unit/kg/hr to 900 units/hr and check level in 8 hours.    Wynona Neat, PharmD, BCPS  05/15/2020,11:34 PM

## 2020-05-15 NOTE — Progress Notes (Signed)
Progress Note  Patient Name: Donna Sloan Date of Encounter: 05/15/2020  Primary Cardiologist: Quay Burow, MD   Subjective   79 yo F with Aortic Atherosclerosis, HTN, HLD, lost to follow up; distant PAF. Found to have obstructive high grade CAD with normal LV function (05/15/20).  Patient denies CP overnight.  Received LCP this AM with disease concerning for need for bypass.  Patient is tearful that she will not be able to care for her husband, but happy that her sons are helping out.  Inpatient Medications    Scheduled Meds:  aspirin  324 mg Oral NOW   Or   aspirin  300 mg Rectal NOW   aspirin  81 mg Oral Daily   aspirin EC  81 mg Oral Daily   lisinopril  5 mg Oral Daily   lisinopril-hydrochlorothiazide  1 tablet Oral Daily   metoprolol succinate  100 mg Oral Daily   metoprolol tartrate  25 mg Oral BID   pantoprazole  40 mg Oral Daily   pantoprazole  40 mg Oral Daily   pravastatin  20 mg Oral Daily   sodium chloride flush  3 mL Intravenous Q12H   Continuous Infusions:  sodium chloride     sodium chloride     heparin 800 Units/hr (05/15/20 0500)   PRN Meds: sodium chloride, acetaminophen, acetaminophen, hydrALAZINE, labetalol, morphine injection, nitroGLYCERIN, ondansetron (ZOFRAN) IV, ondansetron (ZOFRAN) IV, sodium chloride flush   Vital Signs    Vitals:   05/15/20 0835 05/15/20 0836 05/15/20 0841 05/15/20 0845  BP: (!) 190/84 (!) 196/81  (!) 160/59  Pulse: 96 98 (!) 0 (!) 102  Resp: (!) 38 (!) 45 (!) 0 (!) 80  Temp:      TempSrc:      SpO2: (!) 0% (!) 0% (!) 0% (!) 0%  Weight:      Height:       BP 125/78   Intake/Output Summary (Last 24 hours) at 05/15/2020 0908 Last data filed at 05/15/2020 0800 Gross per 24 hour  Intake 1129.66 ml  Output 800 ml  Net 329.66 ml   Filed Weights   05/14/20 1957 05/14/20 2100 05/15/20 0102  Weight: 85.3 kg 85.3 kg 85.3 kg    Telemetry    Sinus rhythm with sinus arrythmia - Personally  Reviewed  ECG    05/15/20 1 AM; SR with non specific TW flattening - Personally Reviewed  Physical Exam   GEN: No acute distress.   Neck: No JVD Cardiac: RRR, no murmurs, rubs, or gallops.  Respiratory: Clear to auscultation bilaterally. GI: Soft, nontender, non-distended  MS: No edema; No deformity no R radial hematoma Neuro:  Nonfocal  Psych: Tearful mood, sad affect  Labs    Chemistry Recent Labs  Lab 05/14/20 1308 05/15/20 0356  NA 139 139  K 3.9 3.7  CL 105 108  CO2 21* 23  GLUCOSE 114* 100*  BUN 25* 23  CREATININE 1.24* 1.07*  CALCIUM 10.2 9.3  GFRNONAA 42* 50*  ANIONGAP 13 8     Hematology Recent Labs  Lab 05/14/20 1308 05/15/20 0356  WBC 10.8* 9.0  RBC 5.51* 4.56  HGB 16.1* 13.7  HCT 51.6* 41.7  MCV 93.6 91.4  MCH 29.2 30.0  MCHC 31.2 32.9  RDW 13.0 13.1  PLT 170 162    Radiology    DG Chest 2 View  Result Date: 05/14/2020 CLINICAL DATA:  Chest pain EXAM: CHEST - 2 VIEW COMPARISON:  April 10, 2014 FINDINGS: There is minimal  scarring in the left base. Lungs elsewhere are clear. Heart size and pulmonary vascularity are normal. No adenopathy. There is mild degenerative change in the thoracic spine. IMPRESSION: Mild scarring left base. No edema or airspace opacity. Heart size normal. Electronically Signed   By: Lowella Grip III M.D.   On: 05/14/2020 13:52   CARDIAC CATHETERIZATION  Result Date: 05/15/2020  Ost LM to Mid LM lesion is 60% stenosed.  Ost Cx lesion is 70% stenosed.  Prox LAD to Mid LAD lesion is 80% stenosed.  Ost LAD to Prox LAD lesion is 70% stenosed.  The left ventricular systolic function is normal.  LV end diastolic pressure is normal.  The left ventricular ejection fraction is 55-65% by visual estimate.  Donna Sloan is a 79 y.o. female  [id number] LOCATION:  FACILITY: [place] PHYSICIAN: Quay Burow, M.D. 1941/02/08 DATE OF PROCEDURE:  05/15/2020 DATE OF DISCHARGE: CARDIAC CATHETERIZATION History obtained from chart  review.Donna Sloan is a 79 y.o. female with a hx of PAF not on AC, OSA not on CPAP, HTN, HLD, and RLS who was admitted with unstable angina.  Her EKG showed nonspecific changes.  She attributed the symptoms to reflux however her enzymes went up to 900 and she was referred for diagnostic coronary angiography.   Donna Sloan was admitted with non-STEMI.  She has moderate left main with long segmental ostial/proximal LAD disease and ostial circumflex disease in the left dominant system.  She has normal LV function.  I suspect long-term she would be best served with complete revascularization with CABG as opposed to long stented segment in the LAD.  I reviewed these films with Dr. Claiborne Billings who concurs.  The sheath was removed and a TR band was placed on the right wrist to achieve patent hemostasis.  The patient left lab in stable condition.  I will restart IV heparin 4 hours after sheath removal. Quay Burow. MD, [facility] 05/15/2020 8:41 AM    Cardiac Studies   LCP 10/14:  High grad LM ~LAD stenosis:    Patient Profile     79 y.o. female with NSTEMI and LM Disease  Assessment & Plan    NSTEMI Coronary Artery Disease; Obstructive HTN - symptomatic  - anatomy: LM and LAD disease; surgery eval started - continue ASA 81 mg; Heparin and pantoprazole; has not received secondary antiplatelet medication through admission - trial of atorvastatin, goal LDL < 70 - metoprolol 25 mg BID - patient being evaluated for CABG - attempting restart lisinopril 5 mg - echo pending - discussed cardiac rehab  Distant PAF on no prior AC - higher risk of post surgical AF; presently will not start [facility] for this indication; starting for the above - currently SR  For questions or updates, please contact [facility] Please consult [email or web address] for contact info under Cardiology/STEMI.      Signed, Werner Lean, MD  05/15/2020, 9:08 AM

## 2020-05-15 NOTE — Consult Note (Signed)
[address]       [place], [address]             [phone]        Donna Sloan [place] Medical Record #[id number] Date of Birth: 1941-07-10  Referring: No ref. provider found Primary Care: Stephens Shire, MD Primary Cardiologist:Jonathan Gwenlyn Found, MD  Chief Complaint:    Chief Complaint  Patient presents with  . Chest Pain    History of Present Illness:     79 year old female presented to the emergency department with acute onset chest pain and indigestion.  She was diagnosed with an NSTEMI and taken to the Cath Lab where two-vessel coronary artery disease was discovered.  She currently is chest pain-free.  She does have a history of paroxysmal atrial fibrillation but currently is in sinus rhythm.  She lives at home with her husband and her son, and is the main caregiver for her husband who is nonambulatory.   Past Medical and Surgical History: Previous Chest Surgery: None Previous Chest Radiation: None Diabetes Mellitus: No.  HbA1C pending Creatinine: 1.07  Past Medical History:  Diagnosis Date  . Chest pain    2D ECHO, 11/24/2010 - EF >55%, NUCLEAR STRESS TEST, 11/24/2010 - normal, no EKG changes for ischemia  . Claudication ([place])    LEA DUPLEX, 12/15/2010 - normal scan  . Endometrial adenocarcinoma ([place])    Stage I  . GERD (gastroesophageal reflux disease)   . Hyperlipidemia    statin intolerant  . Hypertension   . Urinary incontinence     Past Surgical History:  Procedure Laterality Date  . BLADDER SUSPENSION    . CARPAL TUNNEL RELEASE    . LAPAROSCOPIC ASSISTED VAGINAL HYSTERECTOMY  12/2006   BSO with anterior colporrhaphy      Social History   Tobacco Use  Smoking Status Never Smoker  Smokeless Tobacco Never Used    Social History   Substance and Sexual Activity  Alcohol Use No  . Alcohol/week: 0.0 standard drinks     Allergies  Allergen Reactions  . Penicillins     Has patient had a PCN reaction causing  immediate rash, facial/tongue/throat swelling, SOB or lightheadedness with hypotension: Yes Has patient had a PCN reaction causing severe rash involving mucus membranes or skin necrosis: No Has patient had a PCN reaction that required hospitalization: No Has patient had a PCN reaction occurring within the last 10 years: No If all of the above answers are "NO", then may proceed with Cephalosporin use.  . Rosuvastatin Nausea And Vomiting  . Simvastatin Nausea And Vomiting     Current Facility-Administered Medications  Medication Dose Route Frequency Provider Last Rate Last Admin  . 0.9 %  sodium chloride infusion   Intravenous Continuous Lorretta Harp, MD 75 mL/hr at 05/15/20 1010 New Bag at 05/15/20 1010  . 0.9 %  sodium chloride infusion  250 mL Intravenous PRN Lorretta Harp, MD      . acetaminophen (TYLENOL) tablet 650 mg  650 mg Oral Q4H PRN Lorretta Harp, MD      . aspirin chewable tablet 324 mg  324 mg Oral NOW Lorretta Harp, MD       Or  . aspirin suppository 300 mg  300 mg Rectal NOW Lorretta Harp, MD      . Derrill Memo ON 05/16/2020] aspirin chewable tablet 81 mg  81 mg Oral Daily Lorretta Harp, MD      .  atorvastatin (LIPITOR) tablet 80 mg  80 mg Oral Daily Chandrasekhar, Mahesh A, MD   80 mg at 05/15/20 1036  . heparin ADULT infusion 100 units/mL (25000 units/211mL sodium chloride 0.45%)  800 Units/hr Intravenous Continuous [place], Donalynn Furlong, RPH      . labetalol (NORMODYNE) injection 10 mg  10 mg Intravenous Q10 min PRN Lorretta Harp, MD      . lisinopril (ZESTRIL) tablet 5 mg  5 mg Oral Daily Chandrasekhar, Mahesh A, MD   5 mg at 05/15/20 1037  . metoprolol tartrate (LOPRESSOR) tablet 25 mg  25 mg Oral BID Lorretta Harp, MD   25 mg at 05/15/20 1037  . morphine 2 MG/ML injection 2 mg  2 mg Intravenous Q1H PRN Lorretta Harp, MD      . nitroGLYCERIN (NITROSTAT) SL tablet 0.4 mg  0.4 mg Sublingual Q5 Min x 3 PRN Lorretta Harp, MD      . ondansetron  [facility]) injection 4 mg  4 mg Intravenous Q6H PRN Lorretta Harp, MD      . pantoprazole (PROTONIX) EC tablet 40 mg  40 mg Oral Daily Lorretta Harp, MD   40 mg at 05/15/20 1037  . sodium chloride flush (NS) 0.9 % injection 3 mL  3 mL Intravenous Q12H Lorretta Harp, MD   3 mL at 05/15/20 1038  . sodium chloride flush (NS) 0.9 % injection 3 mL  3 mL Intravenous PRN Lorretta Harp, MD        Medications Prior to Admission  Medication Sig Dispense Refill Last Dose  . bismuth subsalicylate (PEPTO BISMOL) 262 MG/15ML suspension Take 30 mLs by mouth every 6 (six) hours as needed for indigestion.   05/14/2020 at Unknown time  . lisinopril-hydrochlorothiazide (PRINZIDE,ZESTORETIC) 20-12.5 MG tablet Take 1 tablet by mouth daily. (Patient not taking: Reported on 05/14/2020) 90 tablet 3 Not Taking at Unknown time  . metoprolol succinate (TOPROL-XL) 100 MG 24 hr tablet Take 1 tablet (100 mg total) by mouth daily. (Patient not taking: Reported on 05/14/2020) 90 tablet 3 Not Taking at Unknown time  . omeprazole (PRILOSEC) 20 MG capsule Take 1 capsule (20 mg total) by mouth daily. (Patient not taking: Reported on 05/14/2020) 90 capsule 3 Not Taking at Unknown time  . pravastatin (PRAVACHOL) 20 MG tablet TAKE 1 TABLET(20 MG) BY MOUTH EVERY EVENING (Patient not taking: Reported on 05/14/2020) 30 tablet 11 Not Taking at Unknown time  . pravastatin (PRAVACHOL) 20 MG tablet TAKE 1 TABLET(20 MG) BY MOUTH EVERY EVENING (Patient not taking: Reported on 05/14/2020) 30 tablet 11 Not Taking at Unknown time    Family History  Problem Relation Age of Onset  . Heart attack Father 45       Massive - deceased  . Heart attack Brother 57       Massive - deceased  . Heart attack Maternal Grandfather   . Kidney disease Maternal Grandmother   . Heart attack Paternal Grandfather      Review of Systems:   Review of Systems  Constitutional: Positive for malaise/fatigue.  Respiratory: Positive for shortness of  breath.   Cardiovascular: Positive for chest pain.  Gastrointestinal: Positive for heartburn.      Physical Exam: BP (!) 153/67 (BP Location: Left Leg)   Pulse 85   Temp 98.4 F (36.9 C) (Oral)   Resp 20   Ht 5\' 2"  (1.575 m)   Wt 85.3 kg   SpO2 97%   BMI 34.40 kg/m  Physical Exam Constitutional:      General: She is not in acute distress.    Appearance: She is well-developed. She is obese. She is not ill-appearing or diaphoretic.  HENT:     Head: Normocephalic and atraumatic.  Cardiovascular:     Rate and Rhythm: Normal rate and regular rhythm.     Heart sounds: Normal heart sounds. No murmur heard.   Pulmonary:     Effort: Pulmonary effort is normal. No tachypnea or respiratory distress.     Breath sounds: Normal breath sounds.  Musculoskeletal:        General: Normal range of motion.     Cervical back: Normal range of motion.  Skin:    General: Skin is warm and dry.     Comments: Spider veins of  bilateral lower extremities  Neurological:     General: No focal deficit present.     Mental Status: She is alert and oriented to person, place, and time.       Diagnostic Studies & Laboratory data:    Left Heart Catherization: Left dominant system with left main and LAD disease.  There were good targets within the circumflex distribution.  The LAD is smaller but this would also be a suitable target.   Echo: Pending   I have independently reviewed the above radiologic studies and discussed with the patient   Recent Lab Findings: Lab Results  Component Value Date   WBC 9.0 05/15/2020   HGB 13.7 05/15/2020   HCT 41.7 05/15/2020   PLT 162 05/15/2020   GLUCOSE 100 (H) 05/15/2020   CHOL 208 (H) 05/15/2020   TRIG 84 05/15/2020   HDL 55 05/15/2020   LDLCALC 136 (H) 05/15/2020   ALT 21 02/06/2018   AST 24 02/06/2018   NA 139 05/15/2020   K 3.7 05/15/2020   CL 108 05/15/2020   CREATININE 1.07 (H) 05/15/2020   BUN 23 05/15/2020   CO2 23 05/15/2020   INR 1.0  06/24/2008      Assessment / Plan:   79 year old female who presents with left main, two-vessel coronary disease and an NSTEMI.  Her echocardiogram is pending.  She has good targets in the LAD and circumflex distribution.  I do to scheduling the earliest availability for surgery will be Tuesday 10/19.  She is quite emotional today but I will revisit the risks and benefits of coronary artery bypass grafting and potential atrial clip with her later this week.  Continue medical management for now.     I  spent 40 minutes counseling the patient face to face.   Lajuana Matte 05/15/2020 12:01 PM

## 2020-05-15 NOTE — Progress Notes (Signed)
Pre cabg has been completed.   Preliminary results in CV Proc.   Donna Sloan 05/15/2020 3:38 PM

## 2020-05-15 NOTE — Interval H&P Note (Signed)
Cath Lab Visit (complete for each Cath Lab visit)  Clinical Evaluation Leading to the Procedure:   ACS: Yes.    Non-ACS:    Anginal Classification: CCS III  Anti-ischemic medical therapy: Minimal Therapy (1 class of medications)  Non-Invasive Test Results: No non-invasive testing performed  Prior CABG: No previous CABG      History and Physical Interval Note:  05/15/2020 7:36 AM  Donna Sloan  has presented today for surgery, with the diagnosis of chest pain.  The various methods of treatment have been discussed with the patient and family. After consideration of risks, benefits and other options for treatment, the patient has consented to  Procedure(s): LEFT HEART CATH AND CORONARY ANGIOGRAPHY (N/A) as a surgical intervention.  The patient's history has been reviewed, patient examined, no change in status, stable for surgery.  I have reviewed the patient's chart and labs.  Questions were answered to the patient's satisfaction.     Quay Burow

## 2020-05-16 ENCOUNTER — Ambulatory Visit: Payer: Medicare Other | Admitting: Cardiovascular Disease

## 2020-05-16 DIAGNOSIS — I200 Unstable angina: Secondary | ICD-10-CM

## 2020-05-16 DIAGNOSIS — I10 Essential (primary) hypertension: Secondary | ICD-10-CM | POA: Diagnosis not present

## 2020-05-16 DIAGNOSIS — I214 Non-ST elevation (NSTEMI) myocardial infarction: Secondary | ICD-10-CM | POA: Diagnosis not present

## 2020-05-16 LAB — HEMOGLOBIN A1C
Hgb A1c MFr Bld: 5.8 % — ABNORMAL HIGH (ref 4.8–5.6)
Mean Plasma Glucose: 120 mg/dL

## 2020-05-16 LAB — BASIC METABOLIC PANEL
Anion gap: 9 (ref 5–15)
BUN: 15 mg/dL (ref 8–23)
CO2: 23 mmol/L (ref 22–32)
Calcium: 9.8 mg/dL (ref 8.9–10.3)
Chloride: 108 mmol/L (ref 98–111)
Creatinine, Ser: 1.07 mg/dL — ABNORMAL HIGH (ref 0.44–1.00)
GFR, Estimated: 50 mL/min — ABNORMAL LOW (ref >60)
Glucose, Bld: 94 mg/dL (ref 70–99)
Potassium: 3.5 mmol/L (ref 3.5–5.1)
Sodium: 140 mmol/L (ref 135–145)

## 2020-05-16 LAB — ECHOCARDIOGRAM COMPLETE
AR max vel: 1.89 cm2
AV Area VTI: 1.95 cm2
AV Area mean vel: 1.94 cm2
AV Mean grad: 8.7 mmHg
AV Peak grad: 17.4 mmHg
Ao pk vel: 2.09 m/s
Area-P 1/2: 3.54 cm2
Calc EF: 70.5 %
Height: 62 in
S' Lateral: 2.55 cm
Single Plane A2C EF: 71.1 %
Single Plane A4C EF: 70.3 %
Weight: 3009.6 oz

## 2020-05-16 LAB — CBC
HCT: 42.7 % (ref 36.0–46.0)
Hemoglobin: 13.9 g/dL (ref 12.0–15.0)
MCH: 29.3 pg (ref 26.0–34.0)
MCHC: 32.6 g/dL (ref 30.0–36.0)
MCV: 90.1 fL (ref 80.0–100.0)
Platelets: 173 10*3/uL (ref 150–400)
RBC: 4.74 MIL/uL (ref 3.87–5.11)
RDW: 12.9 % (ref 11.5–15.5)
WBC: 7 10*3/uL (ref 4.0–10.5)
nRBC: 0 % (ref 0.0–0.2)

## 2020-05-16 LAB — HEPARIN LEVEL (UNFRACTIONATED): Heparin Unfractionated: 0.33 IU/mL (ref 0.30–0.70)

## 2020-05-16 MED ORDER — PERFLUTREN LIPID MICROSPHERE
1.0000 mL | INTRAVENOUS | Status: AC | PRN
  Administered 2020-05-16: 2 mL via INTRAVENOUS
  Filled 2020-05-16: qty 10

## 2020-05-16 MED ORDER — LISINOPRIL 5 MG PO TABS
5.0000 mg | ORAL_TABLET | Freq: Once | ORAL | Status: AC
  Administered 2020-05-16: 5 mg via ORAL
  Filled 2020-05-16: qty 1

## 2020-05-16 MED ORDER — LISINOPRIL 10 MG PO TABS
10.0000 mg | ORAL_TABLET | Freq: Every day | ORAL | Status: DC
  Administered 2020-05-17 – 2020-05-19 (×3): 10 mg via ORAL
  Filled 2020-05-16 (×3): qty 1

## 2020-05-16 NOTE — Progress Notes (Signed)
  Echocardiogram 2D Echocardiogram with definity has been performed.  Darlina Sicilian M 05/16/2020, 7:21 AM

## 2020-05-16 NOTE — Progress Notes (Signed)
[place] for heparin Indication: NSTEMI, multivessel CAD  Allergies  Allergen Reactions  . Penicillins     Has patient had a PCN reaction causing immediate rash, facial/tongue/throat swelling, SOB or lightheadedness with hypotension: Yes Has patient had a PCN reaction causing severe rash involving mucus membranes or skin necrosis: No Has patient had a PCN reaction that required hospitalization: No Has patient had a PCN reaction occurring within the last 10 years: No If all of the above answers are "NO", then may proceed with Cephalosporin use.  . Rosuvastatin Nausea And Vomiting  . Simvastatin Nausea And Vomiting    Patient Measurements: Height: 5\' 2"  (157.5 cm) Weight: 85.1 kg (187 lb 9.6 oz) IBW/kg (Calculated) : 50.1 Heparin Dosing Weight: 67.3kg  Vital Signs: Temp: 98.3 F (36.8 C) (10/15 1141) Temp Source: Oral (10/15 1141) BP: 140/73 (10/15 1141) Pulse Rate: 75 (10/15 1141)  Labs: Recent Labs    05/14/20 1308 05/14/20 1308 05/14/20 1545 05/14/20 2113 05/15/20 0356 05/15/20 2157 05/16/20 0959  HGB 16.1*   < >  --   --  13.7  --  13.9  HCT 51.6*  --   --   --  41.7  --  42.7  PLT 170  --   --   --  162  --  173  HEPARINUNFRC  --   --   --   --  0.38 0.24* 0.33  CREATININE 1.24*  --   --   --  1.07*  --  1.07*  TROPONINIHS 259*  --  932* 4,696*  --   --   --    < > = values in this interval not displayed.    Estimated Creatinine Clearance: 43.8 mL/min (A) (by C-G formula based on SCr of 1.07 mg/dL (H)).  Assessment: 6 YOF presenting with CP, elevated troponin and concern for NSTEMI, not on anticoagulation PTA. She is s/p cath and shown to have multivessel CAD - TCTS consulted for CABG. Pharmacy consulted to resume IV heparin 4h post TR band removal. Expect TR band to be removed ~12:00.  Heparin level is therapeutic (0.33) on 900 units/hr. No bleeding noted, CBC is normal.  Goal of Therapy:  Heparin level 0.3-0.7  units/ml Monitor platelets by anticoagulation protocol: Yes   Plan:  Continue heparin drip at 900 units/hr  Daily heparin level, CBC Monitor for s/sx of bleeding CABG planned for 10/19  Thank you for involving pharmacy in this patient's care.  Renold Genta, PharmD, BCPS Clinical Pharmacist Clinical phone for 05/16/2020 until 3p is [phone] 05/16/2020 12:34 PM  **Pharmacist phone directory can be found on [email or web address] listed under [place]**

## 2020-05-16 NOTE — Progress Notes (Signed)
Progress Note  Patient Name: Donna Sloan Date of Encounter: 05/16/2020  Primary Cardiologist: Quay Burow, MD   Subjective   79 yo F with Aortic Atherosclerosis, HTN, HLD, lost to follow up; distant PAF. Found to have obstructive high grade CAD (10/14 LCP) and grossly normal LV Fx (10/15 contrast echo- reviewed).  Planned for bypass 05/20/20  Two episodes of angina 05/15/20.  None today.  Had BM.  Anxious about surgery.  Inpatient Medications    Scheduled Meds:  aspirin  81 mg Oral Daily   atorvastatin  80 mg Oral Daily   lisinopril  5 mg Oral Daily   metoprolol tartrate  25 mg Oral BID   pantoprazole  40 mg Oral Daily   sodium chloride flush  3 mL Intravenous Q12H   Continuous Infusions:  sodium chloride     heparin 900 Units/hr (05/16/20 0717)   PRN Meds: sodium chloride, acetaminophen, alum & mag hydroxide-simeth, morphine injection, nitroGLYCERIN, ondansetron (ZOFRAN) IV, perflutren lipid microspheres (DEFINITY) IV suspension, sodium chloride flush   Vital Signs    Vitals:   05/15/20 2005 05/16/20 0227 05/16/20 0230 05/16/20 0813  BP: (!) 152/81  (!) 161/70 (!) 171/98  Pulse: 66  90 85  Resp: 18  (!) 24 20  Temp: 98.2 F (36.8 C)   98.4 F (36.9 C)  TempSrc: Oral   Oral  SpO2: 97%  100% 96%  Weight:  85.1 kg    Height:        Intake/Output Summary (Last 24 hours) at 05/16/2020 0902 Last data filed at 05/16/2020 0724 Gross per 24 hour  Intake 900.67 ml  Output 1550 ml  Net -649.33 ml   Filed Weights   05/14/20 2100 05/15/20 0102 05/16/20 0227  Weight: 85.3 kg 85.3 kg 85.1 kg    Telemetry    Sinus rhythm with sinus arrythmia - Personally Reviewed  ECG    05/15/20 1 AM; SR with non specific TW flattening - Personally Reviewed  Physical Exam   GEN: No acute distress.   Neck: No JVD Cardiac: RRR, no murmurs, rubs, or gallops.  Respiratory: Clear to auscultation bilaterally. GI: Soft, nontender, non-distended  MS: No edema; No  deformity  Neuro:  Nonfocal  Psych: Normal mood and affect  Labs    Chemistry Recent Labs  Lab 05/14/20 1308 05/15/20 0356  NA 139 139  K 3.9 3.7  CL 105 108  CO2 21* 23  GLUCOSE 114* 100*  BUN 25* 23  CREATININE 1.24* 1.07*  CALCIUM 10.2 9.3  GFRNONAA 42* 50*  ANIONGAP 13 8     Hematology Recent Labs  Lab 05/14/20 1308 05/15/20 0356  WBC 10.8* 9.0  RBC 5.51* 4.56  HGB 16.1* 13.7  HCT 51.6* 41.7  MCV 93.6 91.4  MCH 29.2 30.0  MCHC 31.2 32.9  RDW 13.0 13.1  PLT 170 162    Radiology    DG Chest 2 View  Result Date: 05/14/2020 CLINICAL DATA:  Chest pain EXAM: CHEST - 2 VIEW COMPARISON:  April 10, 2014 FINDINGS: There is minimal scarring in the left base. Lungs elsewhere are clear. Heart size and pulmonary vascularity are normal. No adenopathy. There is mild degenerative change in the thoracic spine. IMPRESSION: Mild scarring left base. No edema or airspace opacity. Heart size normal. Electronically Signed   By: Lowella Grip III M.D.   On: 05/14/2020 13:52   CARDIAC CATHETERIZATION  Result Date: 05/15/2020  Ost LM to Mid LM lesion is 60% stenosed.  Ost Cx  lesion is 70% stenosed.  Prox LAD to Mid LAD lesion is 80% stenosed.  Ost LAD to Prox LAD lesion is 70% stenosed.  The left ventricular systolic function is normal.  LV end diastolic pressure is normal.  The left ventricular ejection fraction is 55-65% by visual estimate.  Donna Sloan is a 79 y.o. female  [id number] LOCATION:  FACILITY: [facility] PHYSICIAN: Quay Burow, M.D. Jan 13, 1941 DATE OF PROCEDURE:  05/15/2020 DATE OF DISCHARGE: CARDIAC CATHETERIZATION History obtained from chart review.[name] is a 79 y.o. female with a hx of PAF not on AC, OSA not on CPAP, HTN, HLD, and RLS who was admitted with unstable angina.  Her EKG showed nonspecific changes.  She attributed the symptoms to reflux however her enzymes went up to 900 and she was referred for diagnostic coronary angiography.   Ms.  Ibsen was admitted with non-STEMI.  She has moderate left main with long segmental ostial/proximal LAD disease and ostial circumflex disease in the left dominant system.  She has normal LV function.  I suspect long-term she would be best served with complete revascularization with CABG as opposed to long stented segment in the LAD.  I reviewed these films with Dr. Claiborne Billings who concurs.  The sheath was removed and a TR band was placed on the right wrist to achieve patent hemostasis.  The patient left lab in stable condition.  I will restart IV heparin 4 hours after sheath removal. Quay Burow. MD, [facility] 05/15/2020 8:41 AM   VAS US DOPPLER PRE CABG  Result Date: 05/15/2020 PREOPERATIVE VASCULAR EVALUATION  Indications:      Pre-CABG. Risk Factors:     Hypertension, hyperlipidemia. Comparison Study: no prior Performing Technologist: Abram Sander RVS  Examination Guidelines: A complete evaluation includes B-mode imaging, spectral Doppler, color Doppler, and power Doppler as needed of all accessible portions of each vessel. Bilateral testing is considered an integral part of a complete examination. Limited examinations for reoccurring indications may be performed as noted.  Right Carotid Findings: +----------+--------+--------+--------+------------+--------+           PSV cm/sEDV cm/sStenosisDescribe    Comments +----------+--------+--------+--------+------------+--------+ CCA Prox  68      16              heterogenous         +----------+--------+--------+--------+------------+--------+ CCA Distal86      27              heterogenous         +----------+--------+--------+--------+------------+--------+ ICA Prox  87      29      1-39%   heterogenous         +----------+--------+--------+--------+------------+--------+ ICA Distal64      32                                   +----------+--------+--------+--------+------------+--------+ ECA       88      8                                     +----------+--------+--------+--------+------------+--------+ Portions of this table do not appear on this page. +----------+--------+-------+--------+------------+           PSV cm/sEDV cmsDescribeArm Pressure +----------+--------+-------+--------+------------+ [id number]                                 +----------+--------+-------+--------+------------+ +---------+--------+--+--------+--+---------+  VertebralPSV cm/s46EDV cm/s16Antegrade +---------+--------+--+--------+--+---------+ Left Carotid Findings: +----------+--------+--------+--------+------------+--------+           PSV cm/sEDV cm/sStenosisDescribe    Comments +----------+--------+--------+--------+------------+--------+ CCA Prox  87      30              heterogenous         +----------+--------+--------+--------+------------+--------+ CCA Distal86      29              heterogenous         +----------+--------+--------+--------+------------+--------+ ICA Prox  80      31      1-39%   heterogenous         +----------+--------+--------+--------+------------+--------+ ICA Distal72      31                                   +----------+--------+--------+--------+------------+--------+ ECA       87      13                                   +----------+--------+--------+--------+------------+--------+ +----------+--------+--------+--------+------------+ SubclavianPSV cm/sEDV cm/sDescribeArm Pressure +----------+--------+--------+--------+------------+           104                                  +----------+--------+--------+--------+------------+ +---------+--------+--+--------+--+---------+ VertebralPSV cm/s45EDV cm/s15Antegrade +---------+--------+--+--------+--+---------+  ABI Findings: +--------+------------------+-----+---------+--------+ Right   Rt Pressure (mmHg)IndexWaveform Comment  +--------+------------------+-----+---------+--------+ Brachial                        triphasic         +--------+------------------+-----+---------+--------+ ATA                            triphasic         +--------+------------------+-----+---------+--------+ PTA                            triphasic         +--------+------------------+-----+---------+--------+ +--------+------------------+-----+---------+-------+ Left    Lt Pressure (mmHg)IndexWaveform Comment +--------+------------------+-----+---------+-------+ Brachial                       triphasic        +--------+------------------+-----+---------+-------+ ATA                            triphasic        +--------+------------------+-----+---------+-------+ PTA                            triphasic        +--------+------------------+-----+---------+-------+  Right Doppler Findings: +--------+--------+-----+---------+--------+ Site    PressureIndexDoppler  Comments +--------+--------+-----+---------+--------+ Brachial             triphasic         +--------+--------+-----+---------+--------+ Radial               triphasic         +--------+--------+-----+---------+--------+ Ulnar                triphasic         +--------+--------+-----+---------+--------+  Left Doppler Findings: +--------+--------+-----+---------+--------+ Site  PressureIndexDoppler  Comments +--------+--------+-----+---------+--------+ Brachial             triphasic         +--------+--------+-----+---------+--------+ Radial               triphasic         +--------+--------+-----+---------+--------+ Ulnar                triphasic         +--------+--------+-----+---------+--------+  Summary: Right Carotid: Velocities in the right ICA are consistent with a 1-39% stenosis. Left Carotid: Velocities in the left ICA are consistent with a 1-39% stenosis. Vertebrals: Bilateral vertebral arteries demonstrate antegrade flow. Right Upper Extremity: Doppler waveform obliterate  with right radial compression. Doppler waveforms remain within normal limits with right ulnar compression. Left Upper Extremity: Doppler waveforms remain within normal limits with left radial compression. Doppler waveforms remain within normal limits with left ulnar compression.  Electronically signed by Servando Snare MD on 05/15/2020 at 5:37:48 PM.    Final     Cardiac Studies   Echo Pending  Patient Profile     79 y.o. female with NSTEMI and LM Disease  Assessment & Plan    NSTEMI Coronary Artery Disease; Obstructive HTN - symptomatic  - anatomy: LM and LAD disease; surgery planned for 10/19 - continue ASA 81 mg; Heparin and pantoprazole; has not received secondary antiplatelet medication through admission - trial of atorvastatin, goal LDL < 70 - metoprolol 25 mg BID-> if persistent HTN will consider transition to [place] - additional lisinopril 5 mg once today; with plan 10 mg daily starting 05/17/20 - echo pending official read, LVEF grossly normal - discussed cardiac rehab  Distant PAF on no prior AC - higher risk of post surgical AF; presently will not start [facility] for this indication; starting for the above - currently SR  For questions or updates, please contact [facility] Please consult [email or web address] for contact info under Cardiology/STEMI.      Signed, Werner Lean, MD  05/16/2020, 9:02 AM

## 2020-05-16 NOTE — Progress Notes (Signed)
      [address]       [place] [address]             [phone]      Sitting up eating breakfast, IV heparin infusing.   Reports an episode of "indigestion" yesterday afternoon. No chest pain or shortness of breath overnight.  Echo, result pending.  Tentatively plan for CABG on Tuesday 10/19 for LM and 2V CAD presenting with NSTEMI. Will continue to follow and evaluate studies as available.   Macarthur Critchley, PA-C [phone]

## 2020-05-17 DIAGNOSIS — I214 Non-ST elevation (NSTEMI) myocardial infarction: Secondary | ICD-10-CM | POA: Diagnosis not present

## 2020-05-17 LAB — CBC
HCT: 42.2 % (ref 36.0–46.0)
Hemoglobin: 13.6 g/dL (ref 12.0–15.0)
MCH: 29.2 pg (ref 26.0–34.0)
MCHC: 32.2 g/dL (ref 30.0–36.0)
MCV: 90.8 fL (ref 80.0–100.0)
Platelets: 153 10*3/uL (ref 150–400)
RBC: 4.65 MIL/uL (ref 3.87–5.11)
RDW: 13 % (ref 11.5–15.5)
WBC: 8 10*3/uL (ref 4.0–10.5)
nRBC: 0 % (ref 0.0–0.2)

## 2020-05-17 LAB — HEPARIN LEVEL (UNFRACTIONATED): Heparin Unfractionated: 0.41 IU/mL (ref 0.30–0.70)

## 2020-05-17 NOTE — Progress Notes (Signed)
Progress Note  Patient Name: Donna Sloan Date of Encounter: 05/17/2020  Primary Cardiologist: Quay Burow, MD   Subjective   No CP, in good spirits. We discussed plan for CABG 10/19.  Inpatient Medications    Scheduled Meds: . aspirin  81 mg Oral Daily  . atorvastatin  80 mg Oral Daily  . lisinopril  10 mg Oral Daily  . metoprolol tartrate  25 mg Oral BID  . pantoprazole  40 mg Oral Daily  . sodium chloride flush  3 mL Intravenous Q12H   Continuous Infusions: . sodium chloride    . heparin 900 Units/hr (05/16/20 0717)   PRN Meds: sodium chloride, acetaminophen, alum & mag hydroxide-simeth, morphine injection, nitroGLYCERIN, ondansetron (ZOFRAN) IV, sodium chloride flush   Vital Signs    Vitals:   05/16/20 1141 05/16/20 2138 05/17/20 0252 05/17/20 0311  BP: 140/73 (!) 131/45 (!) 156/70   Pulse: 75 71 82   Resp: 18 14 15    Temp: 98.3 F (36.8 C) 98.6 F (37 C) 98.4 F (36.9 C)   TempSrc: Oral Oral Oral   SpO2: 96% 96% 98%   Weight:    84.7 kg  Height:        Intake/Output Summary (Last 24 hours) at 05/17/2020 0842 Last data filed at 05/17/2020 0318 Gross per 24 hour  Intake 360 ml  Output 200 ml  Net 160 ml   Filed Weights   05/15/20 0102 05/16/20 0227 05/17/20 0311  Weight: 85.3 kg 85.1 kg 84.7 kg    Telemetry    NSR - Personally Reviewed  ECG    NSR, nonspec T wave abnl - Personally Reviewed  Physical Exam   GEN: No acute distress.   Neck: No JVD Cardiac: regular rhythm, normal rate, no murmurs, rubs, or gallops.  Respiratory: Clear to auscultation bilaterally. GI: Soft, nontender, non-distended  MS: No edema; No deformity. Neuro:  Nonfocal  Psych: Normal affect   Labs    Chemistry Recent Labs  Lab 05/14/20 1308 05/15/20 0356 05/16/20 0959  NA 139 139 140  K 3.9 3.7 3.5  CL 105 108 108  CO2 21* 23 23  GLUCOSE 114* 100* 94  BUN 25* 23 15  CREATININE 1.24* 1.07* 1.07*  CALCIUM 10.2 9.3 9.8  GFRNONAA 42* 50* 50*    ANIONGAP 13 8 9      Hematology Recent Labs  Lab 05/15/20 0356 05/16/20 0959 05/17/20 0302  WBC 9.0 7.0 8.0  RBC 4.56 4.74 4.65  HGB 13.7 13.9 13.6  HCT 41.7 42.7 42.2  MCV 91.4 90.1 90.8  MCH 30.0 29.3 29.2  MCHC 32.9 32.6 32.2  RDW 13.1 12.9 13.0  PLT 162 173 153    Cardiac EnzymesNo results for input(s): TROPONINI in the last 168 hours. No results for input(s): TROPIPOC in the last 168 hours.   BNPNo results for input(s): BNP, PROBNP in the last 168 hours.   DDimer No results for input(s): DDIMER in the last 168 hours.   Radiology    ECHOCARDIOGRAM COMPLETE  Result Date: 05/16/2020    ECHOCARDIOGRAM REPORT   Patient Name:   Donna Sloan Date of Exam: 05/15/2020 Medical Rec #:  [id number]       Height:       62.0 in Accession #:    [id number]      Weight:       188.1 lb Date of Birth:  November 25, 1940       BSA:  1.862 m Patient Age:    19 years        BP:           155/73 mmHg Patient Gender: F               HR:           75 bpm. Exam Location:  Inpatient Procedure: 2D Echo and Intracardiac Opacification Agent Indications:    Chest Pain 786.50 / R07.9  History:        Patient has prior history of Echocardiogram examinations, most                 recent 11/24/2010. NSTEMI and CAD; Risk Factors:Dyslipidemia and                 Hypertension. GERD.  Sonographer:    Darlina Sicilian RDCS Referring Phys: [id number] [place] [facility]  Sonographer Comments: Technically difficult study due to poor echo windows. IMPRESSIONS  1. Left ventricular ejection fraction, by estimation, is 60 to 65%. The left ventricle has normal function. The left ventricle has no regional wall motion abnormalities. There is mild left ventricular hypertrophy. Left ventricular diastolic parameters are consistent with Grade I diastolic dysfunction (impaired relaxation).  2. Right ventricular systolic function is normal. The right ventricular size is normal. There is normal pulmonary artery systolic pressure. The  estimated right ventricular systolic pressure is 92.4 mmHg.  3. The mitral valve is degenerative. No evidence of mitral valve regurgitation. No evidence of mitral stenosis. Severe mitral annular calcification.  4. The aortic valve is abnormal. There is moderate calcification of the aortic valve. Aortic valve regurgitation is not visualized. Mild to moderate aortic valve sclerosis/calcification is present, without any evidence of aortic stenosis.  5. The inferior vena cava is normal in size with greater than 50% respiratory variability, suggesting right atrial pressure of 3 mmHg. FINDINGS  Left Ventricle: Left ventricular ejection fraction, by estimation, is 60 to 65%. The left ventricle has normal function. The left ventricle has no regional wall motion abnormalities. Definity contrast agent was given IV to delineate the left ventricular  endocardial borders. Global longitudinal strain performed but not reported based on interpreter judgement due to suboptimal tracking. The left ventricular internal cavity size was normal in size. There is mild left ventricular hypertrophy. Left ventricular diastolic parameters are consistent with Grade I diastolic dysfunction (impaired relaxation). Right Ventricle: The right ventricular size is normal. No increase in right ventricular wall thickness. Right ventricular systolic function is normal. There is normal pulmonary artery systolic pressure. The tricuspid regurgitant velocity is 2.21 m/s, and  with an assumed right atrial pressure of 3 mmHg, the estimated right ventricular systolic pressure is 26.8 mmHg. Left Atrium: Left atrial size was normal in size. Right Atrium: Right atrial size was normal in size. Pericardium: There is no evidence of pericardial effusion. Mitral Valve: The mitral valve is degenerative in appearance. Severe mitral annular calcification. No evidence of mitral valve regurgitation. No evidence of mitral valve stenosis. Tricuspid Valve: The tricuspid valve  is normal in structure. Tricuspid valve regurgitation is mild . No evidence of tricuspid stenosis. Aortic Valve: The aortic valve is abnormal. There is moderate calcification of the aortic valve. Aortic valve regurgitation is not visualized. Mild to moderate aortic valve sclerosis/calcification is present, without any evidence of aortic stenosis. Aortic valve mean gradient measures 8.7 mmHg. Aortic valve peak gradient measures 17.4 mmHg. Aortic valve area, by VTI measures 1.95 cm. Pulmonic Valve: The pulmonic valve was grossly normal.  Pulmonic valve regurgitation is trivial. No evidence of pulmonic stenosis. Aorta: The aortic root is normal in size and structure. Venous: The inferior vena cava is normal in size with greater than 50% respiratory variability, suggesting right atrial pressure of 3 mmHg. IAS/Shunts: No atrial level shunt detected by color flow Doppler.  LEFT VENTRICLE PLAX 2D LVIDd:         3.85 cm     Diastology LVIDs:         2.55 cm     LV e' medial:    5.66 cm/s LV PW:         1.10 cm     LV E/e' medial:  14.1 LV IVS:        1.15 cm     LV e' lateral:   6.74 cm/s LVOT diam:     2.20 cm     LV E/e' lateral: 11.8 LV SV:         84 LV SV Index:   45 LVOT Area:     3.80 cm  LV Volumes (MOD) LV vol d, MOD A2C: 85.1 ml LV vol d, MOD A4C: 64.7 ml LV vol s, MOD A2C: 24.6 ml LV vol s, MOD A4C: 19.2 ml LV SV MOD A2C:     60.5 ml LV SV MOD A4C:     64.7 ml LV SV MOD BP:      54.1 ml RIGHT VENTRICLE RV S prime:     12.30 cm/s TAPSE (M-mode): 2.1 cm LEFT ATRIUM           Index       RIGHT ATRIUM           Index LA diam:      2.90 cm 1.56 cm/m  RA Area:     14.50 cm LA Vol (A2C): 35.3 ml 18.95 ml/m RA Volume:   35.30 ml  18.95 ml/m LA Vol (A4C): 26.1 ml 14.01 ml/m  AORTIC VALVE AV Area (Vmax):    1.89 cm AV Area (Vmean):   1.94 cm AV Area (VTI):     1.95 cm AV Vmax:           208.73 cm/s AV Vmean:          138.985 cm/s AV VTI:            0.431 m AV Peak Grad:      17.4 mmHg AV Mean Grad:      8.7 mmHg  LVOT Vmax:         104.00 cm/s LVOT Vmean:        71.000 cm/s LVOT VTI:          0.221 m LVOT/AV VTI ratio: 0.51  AORTA Ao Root diam: 3.00 cm MITRAL VALVE               TRICUSPID VALVE MV Area (PHT): 3.54 cm    TR Peak grad:   19.5 mmHg MV Decel Time: 214 msec    TR Vmax:        221.00 cm/s MV E velocity: 79.70 cm/s MV A velocity: 97.30 cm/s  SHUNTS MV E/A ratio:  0.82        Systemic VTI:  0.22 m                            Systemic Diam: 2.20 cm Cherlynn Kaiser MD Electronically signed by Cherlynn Kaiser MD Signature Date/Time: 05/16/2020/11:19:48 AM    Final  VAS US DOPPLER PRE CABG  Result Date: 05/15/2020 PREOPERATIVE VASCULAR EVALUATION  Indications:      Pre-CABG. Risk Factors:     Hypertension, hyperlipidemia. Comparison Study: no prior Performing Technologist: Abram Sander RVS  Examination Guidelines: A complete evaluation includes B-mode imaging, spectral Doppler, color Doppler, and power Doppler as needed of all accessible portions of each vessel. Bilateral testing is considered an integral part of a complete examination. Limited examinations for reoccurring indications may be performed as noted.  Right Carotid Findings: +----------+--------+--------+--------+------------+--------+           PSV cm/sEDV cm/sStenosisDescribe    Comments +----------+--------+--------+--------+------------+--------+ CCA Prox  68      16              heterogenous         +----------+--------+--------+--------+------------+--------+ CCA Distal86      27              heterogenous         +----------+--------+--------+--------+------------+--------+ ICA Prox  87      29      1-39%   heterogenous         +----------+--------+--------+--------+------------+--------+ ICA Distal64      32                                   +----------+--------+--------+--------+------------+--------+ ECA       88      8                                     +----------+--------+--------+--------+------------+--------+ Portions of this table do not appear on this page. +----------+--------+-------+--------+------------+           PSV cm/sEDV cmsDescribeArm Pressure +----------+--------+-------+--------+------------+ [id number]                                 +----------+--------+-------+--------+------------+ +---------+--------+--+--------+--+---------+ VertebralPSV cm/s46EDV cm/s16Antegrade +---------+--------+--+--------+--+---------+ Left Carotid Findings: +----------+--------+--------+--------+------------+--------+           PSV cm/sEDV cm/sStenosisDescribe    Comments +----------+--------+--------+--------+------------+--------+ CCA Prox  87      30              heterogenous         +----------+--------+--------+--------+------------+--------+ CCA Distal86      29              heterogenous         +----------+--------+--------+--------+------------+--------+ ICA Prox  80      31      1-39%   heterogenous         +----------+--------+--------+--------+------------+--------+ ICA Distal72      31                                   +----------+--------+--------+--------+------------+--------+ ECA       87      13                                   +----------+--------+--------+--------+------------+--------+ +----------+--------+--------+--------+------------+ SubclavianPSV cm/sEDV cm/sDescribeArm Pressure +----------+--------+--------+--------+------------+           104                                  +----------+--------+--------+--------+------------+ +---------+--------+--+--------+--+---------+  VertebralPSV cm/s45EDV cm/s15Antegrade +---------+--------+--+--------+--+---------+  ABI Findings: +--------+------------------+-----+---------+--------+ Right   Rt Pressure (mmHg)IndexWaveform Comment  +--------+------------------+-----+---------+--------+ Brachial                        triphasic         +--------+------------------+-----+---------+--------+ ATA                            triphasic         +--------+------------------+-----+---------+--------+ PTA                            triphasic         +--------+------------------+-----+---------+--------+ +--------+------------------+-----+---------+-------+ Left    Lt Pressure (mmHg)IndexWaveform Comment +--------+------------------+-----+---------+-------+ Brachial                       triphasic        +--------+------------------+-----+---------+-------+ ATA                            triphasic        +--------+------------------+-----+---------+-------+ PTA                            triphasic        +--------+------------------+-----+---------+-------+  Right Doppler Findings: +--------+--------+-----+---------+--------+ Site    PressureIndexDoppler  Comments +--------+--------+-----+---------+--------+ Brachial             triphasic         +--------+--------+-----+---------+--------+ Radial               triphasic         +--------+--------+-----+---------+--------+ Ulnar                triphasic         +--------+--------+-----+---------+--------+  Left Doppler Findings: +--------+--------+-----+---------+--------+ Site    PressureIndexDoppler  Comments +--------+--------+-----+---------+--------+ Brachial             triphasic         +--------+--------+-----+---------+--------+ Radial               triphasic         +--------+--------+-----+---------+--------+ Ulnar                triphasic         +--------+--------+-----+---------+--------+  Summary: Right Carotid: Velocities in the right ICA are consistent with a 1-39% stenosis. Left Carotid: Velocities in the left ICA are consistent with a 1-39% stenosis. Vertebrals: Bilateral vertebral arteries demonstrate antegrade flow. Right Upper Extremity: Doppler waveform obliterate with right  radial compression. Doppler waveforms remain within normal limits with right ulnar compression. Left Upper Extremity: Doppler waveforms remain within normal limits with left radial compression. Doppler waveforms remain within normal limits with left ulnar compression.  Electronically signed by Servando Snare MD on 05/15/2020 at 5:37:48 PM.    Final     Cardiac Studies   1. Left ventricular ejection fraction, by estimation, is 60 to 65%. The  left ventricle has normal function. The left ventricle has no regional  wall motion abnormalities. There is mild left ventricular hypertrophy.  Left ventricular diastolic parameters  are consistent with Grade I diastolic dysfunction (impaired relaxation).  2. Right ventricular systolic function is normal. The right ventricular  size is normal. There is normal pulmonary artery systolic pressure. The  estimated right ventricular systolic pressure is 22.5  mmHg.  3. The mitral valve is degenerative. No evidence of mitral valve  regurgitation. No evidence of mitral stenosis. Severe mitral annular  calcification.  4. The aortic valve is abnormal. There is moderate calcification of the  aortic valve. Aortic valve regurgitation is not visualized. Mild to  moderate aortic valve sclerosis/calcification is present, without any  evidence of aortic stenosis.  5. The inferior vena cava is normal in size with greater than 50%  respiratory variability, suggesting right atrial pressure of 3 mmHg.   Patient Profile     79 y.o. female currently admitted for chest pain found to have non-STEMI, cath demonstrating multivessel CAD with left main, ostial LAD, and ostial circumflex lesions.  She is planned for coronary artery bypass grafting on 05/19/2020.  History includes paroxysmal atrial fibrillation not on anticoagulation, obstructive sleep apnea not on CPAP, hypertension, hyperlipidemia.  Assessment & Plan   Active Problems:   Unstable angina (HCC)   NSTEMI (non-ST  elevated myocardial infarction) ([place])   Echocardiogram completed documenting preserved ejection fraction, normal RV function, severe mitral annular calcification without mitral stenosis and aortic valve sclerosis.  Normal RVSP and right atrial pressure.  I have independently reviewed these images. -Plan for CABG 05/20/2020. -Continue ASA 81 mg daily -On heparin IV.  Hemoglobin and platelets stable. -Continue atorvastatin 80 mg daily, metoprolol tartrate 25 mg twice daily, lisinopril 10 mg daily-this dose was increased today.  PAF- CHA2DS2-VASc score is 5 (hypertension, age x2, vascular disease, female sex).  No definite recurrence of atrial fibrillation, though this may occur postoperatively.  Continue to monitor on telemetry.  Hypertension-blood pressure remains mildly elevated, agree with lisinopril 10 mg daily starting today.  Hyperlipidemia-atorvastatin 80 mg daily, LDL 136, HDL 55, triglycerides 84.   For questions or updates, please contact [place] Please consult [email or web address] for contact info under        Signed, Elouise Munroe, MD  05/17/2020, 8:42 AM

## 2020-05-17 NOTE — Progress Notes (Signed)
  CARDIAC REHAB PHASE I   PRE:  Rate/Rhythm: 75 SR  BP:  Supine:   Sitting: 137/72  Standing:    SaO2: 98% RA  MODE:  Ambulation: 212 ft   POST:  Rate/Rhythm: 83 SR  BP:  Supine:   Sitting: 160/79  Standing:    SaO2: 100% RA   1415-1501 Patient ambulated 212 feet with assist x1, gait slow, slightly unsteady, holding on to wall rails for balance. Patient denies chest pain, denies SOB with ambulation, vital signs within normal limits. To chair with call bell within reach. Pre-op education complete including sternal precautions, IS use, and mobility. Cardiac surgery book, In The Tube handout, and care guide given. Patient able to demonstrate getting out of chair properly using legs versus arm rests. Patient verbalizes understanding of instructions give. Patient emotional about upcoming surgery and need for care for her husband but was very appreciative of information provided.   Sol Passer, MS, ACSM CEP

## 2020-05-17 NOTE — Progress Notes (Signed)
     [address]       [place] [address]             [phone]       No events. OR scheduled for 05/20/2020.   Donna Sloan

## 2020-05-17 NOTE — Progress Notes (Signed)
ANTICOAGULATION CONSULT NOTE - Follow-Up  Pharmacy Consult for heparin Indication: NSTEMI, multivessel CAD  Allergies  Allergen Reactions  . Penicillins     Has patient had a PCN reaction causing immediate rash, facial/tongue/throat swelling, SOB or lightheadedness with hypotension: Yes Has patient had a PCN reaction causing severe rash involving mucus membranes or skin necrosis: No Has patient had a PCN reaction that required hospitalization: No Has patient had a PCN reaction occurring within the last 10 years: No If all of the above answers are "NO", then may proceed with Cephalosporin use.  . Rosuvastatin Nausea And Vomiting  . Simvastatin Nausea And Vomiting    Patient Measurements: Height: 5\' 2"  (157.5 cm) Weight: 84.7 kg (186 lb 12.8 oz) IBW/kg (Calculated) : 50.1 Heparin Dosing Weight: 67.3kg  Vital Signs: Temp: 98.4 F (36.9 C) (10/16 0252) Temp Source: Oral (10/16 0252) BP: 156/70 (10/16 0252) Pulse Rate: 82 (10/16 0252)  Labs: Recent Labs    05/14/20 1308 05/14/20 1308 05/14/20 1545 05/14/20 2113 05/15/20 0356 05/15/20 0356 05/15/20 2157 05/16/20 0959 05/17/20 0302  HGB 16.1*   < >  --   --  13.7   < >  --  13.9 13.6  HCT 51.6*   < >  --   --  41.7  --   --  42.7 42.2  PLT 170   < >  --   --  162  --   --  173 153  HEPARINUNFRC  --   --   --   --  0.38   < > 0.24* 0.33 0.41  CREATININE 1.24*  --   --   --  1.07*  --   --  1.07*  --   TROPONINIHS 259*  --  932* 4,696*  --   --   --   --   --    < > = values in this interval not displayed.    Estimated Creatinine Clearance: 43.7 mL/min (A) (by C-G formula based on SCr of 1.07 mg/dL (H)).  Assessment: 79 yo F presenting with CP, elevated troponin and concern for NSTEMI, not on anticoagulation PTA. She is s/p cath and shown to have multivessel CAD - TCTS consulted for CABG. Pharmacy consulted to resume IV heparin.   Heparin level remains therapeutic on 900 units/hr. No bleeding noted, CBC is  normal.  Goal of Therapy:  Heparin level 0.3-0.7 units/ml Monitor platelets by anticoagulation protocol: Yes   Plan:  Continue heparin drip at 900 units/hr  Daily heparin level, CBC Monitor for s/sx of bleeding CABG planned for 10/19  [facility], Pharm.D., BCPS Clinical Pharmacist Clinical phone for 05/17/2020 from 8:30-4:00 is [phone].  **Pharmacist phone directory can be found on [email or web address] listed under [place].  05/17/2020 9:36 AM

## 2020-05-18 DIAGNOSIS — I214 Non-ST elevation (NSTEMI) myocardial infarction: Secondary | ICD-10-CM | POA: Diagnosis not present

## 2020-05-18 LAB — BASIC METABOLIC PANEL
Anion gap: 10 (ref 5–15)
BUN: 20 mg/dL (ref 8–23)
CO2: 21 mmol/L — ABNORMAL LOW (ref 22–32)
Calcium: 9.5 mg/dL (ref 8.9–10.3)
Chloride: 107 mmol/L (ref 98–111)
Creatinine, Ser: 1.13 mg/dL — ABNORMAL HIGH (ref 0.44–1.00)
GFR, Estimated: 47 mL/min — ABNORMAL LOW (ref >60)
Glucose, Bld: 153 mg/dL — ABNORMAL HIGH (ref 70–99)
Potassium: 4 mmol/L (ref 3.5–5.1)
Sodium: 138 mmol/L (ref 135–145)

## 2020-05-18 LAB — CBC
HCT: 39.5 % (ref 36.0–46.0)
Hemoglobin: 12.9 g/dL (ref 12.0–15.0)
MCH: 29.9 pg (ref 26.0–34.0)
MCHC: 32.7 g/dL (ref 30.0–36.0)
MCV: 91.6 fL (ref 80.0–100.0)
Platelets: 144 10*3/uL — ABNORMAL LOW (ref 150–400)
RBC: 4.31 MIL/uL (ref 3.87–5.11)
RDW: 12.8 % (ref 11.5–15.5)
WBC: 6.8 10*3/uL (ref 4.0–10.5)
nRBC: 0 % (ref 0.0–0.2)

## 2020-05-18 LAB — HEPARIN LEVEL (UNFRACTIONATED): Heparin Unfractionated: 0.3 IU/mL (ref 0.30–0.70)

## 2020-05-18 NOTE — Progress Notes (Signed)
ANTICOAGULATION CONSULT NOTE - Follow-Up  Pharmacy Consult for heparin Indication: NSTEMI, multivessel CAD  Allergies  Allergen Reactions  . Penicillins     Has patient had a PCN reaction causing immediate rash, facial/tongue/throat swelling, SOB or lightheadedness with hypotension: Yes Has patient had a PCN reaction causing severe rash involving mucus membranes or skin necrosis: No Has patient had a PCN reaction that required hospitalization: No Has patient had a PCN reaction occurring within the last 10 years: No If all of the above answers are "NO", then may proceed with Cephalosporin use.  . Rosuvastatin Nausea And Vomiting  . Simvastatin Nausea And Vomiting    Patient Measurements: Height: 5\' 2"  (157.5 cm) Weight: 84.9 kg (187 lb 1.6 oz) (scale c) IBW/kg (Calculated) : 50.1 Heparin Dosing Weight: 67.3kg  Vital Signs: Temp: 98.2 F (36.8 C) (10/17 0424) Temp Source: Oral (10/17 0424)  Labs: Recent Labs    05/16/20 0959 05/16/20 0959 05/17/20 0302 05/18/20 0310  HGB 13.9   < > 13.6 12.9  HCT 42.7  --  42.2 39.5  PLT 173  --  153 144*  HEPARINUNFRC 0.33  --  0.41 0.30  CREATININE 1.07*  --   --   --    < > = values in this interval not displayed.    Estimated Creatinine Clearance: 43.8 mL/min (A) (by C-G formula based on SCr of 1.07 mg/dL (H)).  Assessment: 79 yo F presenting with CP, elevated troponin and concern for NSTEMI, not on anticoagulation PTA. She is s/p cath and shown to have multivessel CAD - TCTS consulted for CABG, scheduled for 10/19. Pharmacy consulted to resume IV heparin.   Heparin level remains at low end of goal on 900 units/hr. No bleeding noted, CBC is normal.  Goal of Therapy:  Heparin level 0.3-0.7 units/ml Monitor platelets by anticoagulation protocol: Yes   Plan:  Increase heparin to 1000 units/hr  Daily heparin level, CBC Monitor for s/sx of bleeding CABG planned for 10/19  [facility], Pharm.D., BCPS Clinical  Pharmacist Clinical phone for 05/18/2020 from 8:30-4:00 is [phone].  **Pharmacist phone directory can be found on [email or web address] listed under [place].  05/18/2020 8:37 AM

## 2020-05-18 NOTE — Progress Notes (Addendum)
Progress Note  Patient Name: Donna Sloan Date of Encounter: 05/18/2020  Primary Cardiologist: Quay Burow, MD   Subjective   Feeling well. No CP.  Inpatient Medications    Scheduled Meds: . aspirin  81 mg Oral Daily  . atorvastatin  80 mg Oral Daily  . lisinopril  10 mg Oral Daily  . metoprolol tartrate  25 mg Oral BID  . pantoprazole  40 mg Oral Daily  . sodium chloride flush  3 mL Intravenous Q12H   Continuous Infusions: . sodium chloride    . heparin 900 Units/hr (05/17/20 1454)   PRN Meds: sodium chloride, acetaminophen, alum & mag hydroxide-simeth, morphine injection, nitroGLYCERIN, ondansetron (ZOFRAN) IV, sodium chloride flush   Vital Signs    Vitals:   05/17/20 1118 05/17/20 1258 05/17/20 1954 05/18/20 0424  BP:  123/73 131/70   Pulse:  67    Resp:  18 18   Temp: 97.9 F (36.6 C) 98.2 F (36.8 C) 98 F (36.7 C) 98.2 F (36.8 C)  TempSrc: Oral Oral Oral Oral  SpO2:  100%    Weight:    84.9 kg  Height:        Intake/Output Summary (Last 24 hours) at 05/18/2020 0839 Last data filed at 05/18/2020 0700 Gross per 24 hour  Intake 1056.67 ml  Output 600 ml  Net 456.67 ml   Filed Weights   05/16/20 0227 05/17/20 0311 05/18/20 0424  Weight: 85.1 kg 84.7 kg 84.9 kg    Telemetry    NSR - Personally Reviewed  ECG    NSR, nonspec T wave abnl - Personally Reviewed  Physical Exam   GEN: No acute distress.   Neck: No JVD Cardiac: RRR, no murmurs, rubs, or gallops.  Respiratory: Clear to auscultation bilaterally. GI: Soft, nontender, non-distended  MS: No edema; No deformity. Neuro:  Nonfocal  Psych: Normal affect   Labs    Chemistry Recent Labs  Lab 05/14/20 1308 05/15/20 0356 05/16/20 0959  NA 139 139 140  K 3.9 3.7 3.5  CL 105 108 108  CO2 21* 23 23  GLUCOSE 114* 100* 94  BUN 25* 23 15  CREATININE 1.24* 1.07* 1.07*  CALCIUM 10.2 9.3 9.8  GFRNONAA 42* 50* 50*  ANIONGAP 13 8 9      Hematology Recent Labs  Lab  05/16/20 0959 05/17/20 0302 05/18/20 0310  WBC 7.0 8.0 6.8  RBC 4.74 4.65 4.31  HGB 13.9 13.6 12.9  HCT 42.7 42.2 39.5  MCV 90.1 90.8 91.6  MCH 29.3 29.2 29.9  MCHC 32.6 32.2 32.7  RDW 12.9 13.0 12.8  PLT 173 153 144*    Cardiac EnzymesNo results for input(s): TROPONINI in the last 168 hours. No results for input(s): TROPIPOC in the last 168 hours.   BNPNo results for input(s): BNP, PROBNP in the last 168 hours.   DDimer No results for input(s): DDIMER in the last 168 hours.   Radiology    No results found.  Cardiac Studies  Echo 1. Left ventricular ejection fraction, by estimation, is 60 to 65%. The  left ventricle has normal function. The left ventricle has no regional  wall motion abnormalities. There is mild left ventricular hypertrophy.  Left ventricular diastolic parameters  are consistent with Grade I diastolic dysfunction (impaired relaxation).  2. Right ventricular systolic function is normal. The right ventricular  size is normal. There is normal pulmonary artery systolic pressure. The  estimated right ventricular systolic pressure is 37.9 mmHg.  3. The mitral valve  is degenerative. No evidence of mitral valve  regurgitation. No evidence of mitral stenosis. Severe mitral annular  calcification.  4. The aortic valve is abnormal. There is moderate calcification of the  aortic valve. Aortic valve regurgitation is not visualized. Mild to  moderate aortic valve sclerosis/calcification is present, without any  evidence of aortic stenosis.  5. The inferior vena cava is normal in size with greater than 50%  respiratory variability, suggesting right atrial pressure of 3 mmHg.   Patient Profile     79 y.o. female currently admitted for chest pain found to have non-STEMI, cath demonstrating multivessel CAD with left main, ostial LAD, and ostial circumflex lesions.  She is planned for coronary artery bypass grafting on 05/20/2020.  History includes paroxysmal atrial  fibrillation not on anticoagulation, obstructive sleep apnea not on CPAP, hypertension, hyperlipidemia.  Assessment & Plan   Active Problems:   Unstable angina (HCC)   NSTEMI (non-ST elevated myocardial infarction) ([place])   Echocardiogram completed documenting preserved ejection fraction, normal RV function, severe mitral annular calcification without mitral stenosis and aortic valve sclerosis.  Normal RVSP and right atrial pressure.  I have independently reviewed these images. -Plan for CABG 05/20/2020. -Continue ASA 81 mg daily -On heparin IV.  Hemoglobin and platelets stable. -Continue atorvastatin 80 mg daily, metoprolol tartrate 25 mg twice daily, lisinopril 10 mg daily.  PAF- CHA2DS2-VASc score is 5 (hypertension, age x2, vascular disease, female sex).  No definite recurrence of atrial fibrillation, though this may occur postoperatively.  Continue to monitor on telemetry, none thusfar.  Hypertension -continue BB and ACE  Hyperlipidemia-atorvastatin 80 mg daily, LDL 136, HDL 55, triglycerides 84.  Hypokalemia - K 3.5 on 10/15, not rechecked since. Will recheck today and replete as needed. Please page on call pager for critical lab results.    For questions or updates, please contact [place] Please consult [email or web address] for contact info under        Signed, Elouise Munroe, MD  05/18/2020, 8:39 AM

## 2020-05-19 ENCOUNTER — Inpatient Hospital Stay (HOSPITAL_COMMUNITY): Payer: Medicare Other

## 2020-05-19 DIAGNOSIS — I214 Non-ST elevation (NSTEMI) myocardial infarction: Secondary | ICD-10-CM | POA: Diagnosis not present

## 2020-05-19 DIAGNOSIS — I2510 Atherosclerotic heart disease of native coronary artery without angina pectoris: Secondary | ICD-10-CM | POA: Diagnosis not present

## 2020-05-19 DIAGNOSIS — E785 Hyperlipidemia, unspecified: Secondary | ICD-10-CM | POA: Diagnosis not present

## 2020-05-19 LAB — BASIC METABOLIC PANEL
Anion gap: 9 (ref 5–15)
BUN: 22 mg/dL (ref 8–23)
CO2: 24 mmol/L (ref 22–32)
Calcium: 9.4 mg/dL (ref 8.9–10.3)
Chloride: 108 mmol/L (ref 98–111)
Creatinine, Ser: 1.08 mg/dL — ABNORMAL HIGH (ref 0.44–1.00)
GFR, Estimated: 49 mL/min — ABNORMAL LOW (ref >60)
Glucose, Bld: 94 mg/dL (ref 70–99)
Potassium: 3.9 mmol/L (ref 3.5–5.1)
Sodium: 141 mmol/L (ref 135–145)

## 2020-05-19 LAB — URINALYSIS, ROUTINE W REFLEX MICROSCOPIC
Bacteria, UA: NONE SEEN
Bilirubin Urine: NEGATIVE
Glucose, UA: NEGATIVE mg/dL
Hgb urine dipstick: NEGATIVE
Ketones, ur: NEGATIVE mg/dL
Nitrite: NEGATIVE
Protein, ur: NEGATIVE mg/dL
Specific Gravity, Urine: 1.019 (ref 1.005–1.030)
pH: 5 (ref 5.0–8.0)

## 2020-05-19 LAB — CBC
HCT: 39.4 % (ref 36.0–46.0)
Hemoglobin: 12.7 g/dL (ref 12.0–15.0)
MCH: 29.5 pg (ref 26.0–34.0)
MCHC: 32.2 g/dL (ref 30.0–36.0)
MCV: 91.4 fL (ref 80.0–100.0)
Platelets: 146 10*3/uL — ABNORMAL LOW (ref 150–400)
RBC: 4.31 MIL/uL (ref 3.87–5.11)
RDW: 12.9 % (ref 11.5–15.5)
WBC: 6.3 10*3/uL (ref 4.0–10.5)
nRBC: 0 % (ref 0.0–0.2)

## 2020-05-19 LAB — ABO/RH: ABO/RH(D): A POS

## 2020-05-19 LAB — SURGICAL PCR SCREEN
MRSA, PCR: NEGATIVE
Staphylococcus aureus: POSITIVE — AB

## 2020-05-19 LAB — PREPARE RBC (CROSSMATCH)

## 2020-05-19 LAB — HEPARIN LEVEL (UNFRACTIONATED): Heparin Unfractionated: 0.64 IU/mL (ref 0.30–0.70)

## 2020-05-19 MED ORDER — PHENYLEPHRINE HCL-NACL 20-0.9 MG/250ML-% IV SOLN
30.0000 ug/min | INTRAVENOUS | Status: AC
  Administered 2020-05-20: 15 ug/min via INTRAVENOUS
  Filled 2020-05-19: qty 250

## 2020-05-19 MED ORDER — MUPIROCIN 2 % EX OINT: 1.0000 "application " | TOPICAL_OINTMENT | Freq: Two times a day (BID) | CUTANEOUS | Status: DC

## 2020-05-19 MED ORDER — NOREPINEPHRINE 4 MG/250ML-% IV SOLN
0.0000 ug/min | INTRAVENOUS | Status: DC
  Filled 2020-05-19: qty 250

## 2020-05-19 MED ORDER — PLASMA-LYTE 148 IV SOLN
INTRAVENOUS | Status: DC
  Filled 2020-05-19: qty 2.5

## 2020-05-19 MED ORDER — VANCOMYCIN HCL 1250 MG/250ML IV SOLN
1250.0000 mg | INTRAVENOUS | Status: AC
  Administered 2020-05-20: 1250 mg via INTRAVENOUS
  Filled 2020-05-19: qty 250

## 2020-05-19 MED ORDER — CHLORHEXIDINE GLUCONATE 0.12 % MT SOLN
15.0000 mL | Freq: Once | OROMUCOSAL | Status: AC
  Administered 2020-05-20: 15 mL via OROMUCOSAL
  Filled 2020-05-19: qty 15

## 2020-05-19 MED ORDER — CHLORHEXIDINE GLUCONATE CLOTH 2 % EX PADS: 6.0000 | MEDICATED_PAD | Freq: Every morning | CUTANEOUS | Status: DC

## 2020-05-19 MED ORDER — TRANEXAMIC ACID (OHS) PUMP PRIME SOLUTION
2.0000 mg/kg | INTRAVENOUS | Status: DC
  Filled 2020-05-19: qty 1.69

## 2020-05-19 MED ORDER — TRANEXAMIC ACID 1000 MG/10ML IV SOLN
1.5000 mg/kg/h | INTRAVENOUS | Status: AC
  Administered 2020-05-20: 1.5 mg/kg/h via INTRAVENOUS
  Filled 2020-05-19: qty 25

## 2020-05-19 MED ORDER — POTASSIUM CHLORIDE 2 MEQ/ML IV SOLN
80.0000 meq | INTRAVENOUS | Status: DC
  Filled 2020-05-19: qty 40

## 2020-05-19 MED ORDER — CHLORHEXIDINE GLUCONATE CLOTH 2 % EX PADS
6.0000 | MEDICATED_PAD | Freq: Once | CUTANEOUS | Status: AC
  Administered 2020-05-19: 6 via TOPICAL

## 2020-05-19 MED ORDER — METOPROLOL TARTRATE 12.5 MG HALF TABLET
12.5000 mg | ORAL_TABLET | Freq: Once | ORAL | Status: AC
  Administered 2020-05-20: 12.5 mg via ORAL
  Filled 2020-05-19: qty 1

## 2020-05-19 MED ORDER — TEMAZEPAM 15 MG PO CAPS
15.0000 mg | ORAL_CAPSULE | Freq: Once | ORAL | Status: DC | PRN
  Filled 2020-05-19: qty 1

## 2020-05-19 MED ORDER — TRANEXAMIC ACID (OHS) BOLUS VIA INFUSION
15.0000 mg/kg | INTRAVENOUS | Status: AC
  Administered 2020-05-20: 1270.5 mg via INTRAVENOUS
  Filled 2020-05-19: qty 1271

## 2020-05-19 MED ORDER — CHLORHEXIDINE GLUCONATE CLOTH 2 % EX PADS
6.0000 | MEDICATED_PAD | Freq: Once | CUTANEOUS | Status: AC
  Administered 2020-05-20: 6 via TOPICAL

## 2020-05-19 MED ORDER — BISACODYL 5 MG PO TBEC
5.0000 mg | DELAYED_RELEASE_TABLET | Freq: Once | ORAL | Status: AC
  Administered 2020-05-19: 5 mg via ORAL
  Filled 2020-05-19: qty 1

## 2020-05-19 MED ORDER — SODIUM CHLORIDE 0.9 % IV SOLN
INTRAVENOUS | Status: DC
  Filled 2020-05-19: qty 30

## 2020-05-19 MED ORDER — NITROGLYCERIN IN D5W 200-5 MCG/ML-% IV SOLN
2.0000 ug/min | INTRAVENOUS | Status: AC
  Administered 2020-05-20: 5 ug/min via INTRAVENOUS
  Filled 2020-05-19: qty 250

## 2020-05-19 MED ORDER — MILRINONE LACTATE IN DEXTROSE 20-5 MG/100ML-% IV SOLN
0.3000 ug/kg/min | INTRAVENOUS | Status: DC
  Filled 2020-05-19: qty 100

## 2020-05-19 MED ORDER — LEVOFLOXACIN IN D5W 500 MG/100ML IV SOLN
500.0000 mg | INTRAVENOUS | Status: AC
  Administered 2020-05-20: 500 mg via INTRAVENOUS
  Filled 2020-05-19: qty 100

## 2020-05-19 MED ORDER — MANNITOL 20 % IV SOLN
Freq: Once | INTRAVENOUS | Status: DC
  Filled 2020-05-19: qty 13

## 2020-05-19 MED ORDER — EPINEPHRINE HCL 5 MG/250ML IV SOLN IN NS
0.0000 ug/min | INTRAVENOUS | Status: DC
  Filled 2020-05-19: qty 250

## 2020-05-19 MED ORDER — INSULIN REGULAR(HUMAN) IN NACL 100-0.9 UT/100ML-% IV SOLN
INTRAVENOUS | Status: AC
  Administered 2020-05-20: .6 [IU]/h via INTRAVENOUS
  Filled 2020-05-19: qty 100

## 2020-05-19 MED ORDER — DEXMEDETOMIDINE HCL IN NACL 400 MCG/100ML IV SOLN
0.1000 ug/kg/h | INTRAVENOUS | Status: AC
  Administered 2020-05-20: .5 ug/kg/h via INTRAVENOUS
  Filled 2020-05-19: qty 100

## 2020-05-19 NOTE — Progress Notes (Signed)
RT attempted 2x to get ABG without success.  Pt nervous and intolerant of stick and clamps down.  RT got flash on each attempt but no flow.  RT will continue to monitor.

## 2020-05-19 NOTE — Care Management Important Message (Signed)
Important Message  Patient Details  Name: Donna Sloan MRN: [id number] Date of Birth: 1941/05/05   Medicare Important Message Given:  Yes     Shelda Altes 05/19/2020, 3:25 PM

## 2020-05-19 NOTE — Anesthesia Preprocedure Evaluation (Addendum)
Anesthesia Evaluation  Patient identified by MRN, date of birth, ID band Patient awake    Reviewed: Allergy & Precautions, NPO status , Patient's Chart, lab work & pertinent test results, reviewed documented beta blocker date and time   Airway Mallampati: III  TM Distance: >3 FB Neck ROM: Full    Dental  (+) Edentulous Upper, Missing   Pulmonary sleep apnea ,    Pulmonary exam normal breath sounds clear to auscultation       Cardiovascular hypertension, Pt. on medications and Pt. on home beta blockers + CAD and + Past MI  Normal cardiovascular exam Rhythm:Regular Rate:Normal  ECG: NSR, rate 76  CATH: Ms. Guerin was admitted with non-STEMI. She has moderate left main with long segmental ostial/proximal LAD disease and ostial circumflex disease in the left dominant system. She has normal LV function.  Ost LM to Mid LM lesion is 60% stenosed.  Ost Cx lesion is 70% stenosed.  Prox LAD to Mid LAD lesion is 80% stenosed.  Ost LAD to Prox LAD lesion is 70% stenosed.  The left ventricular systolic function is normal.  LV end diastolic pressure is normal.  The left ventricular ejection fraction is 55-65% by visual estimate.   Neuro/Psych negative neurological ROS  negative psych ROS   GI/Hepatic Neg liver ROS, GERD  Medicated and Controlled,  Endo/Other  negative endocrine ROS  Renal/GU negative Renal ROS     Musculoskeletal negative musculoskeletal ROS (+)   Abdominal (+) + obese,   Peds  Hematology HLD   Anesthesia Other Findings CAD  Reproductive/Obstetrics                           Anesthesia Physical Anesthesia Plan  ASA: IV  Anesthesia Plan: General   Post-op Pain Management:    Induction: Intravenous  PONV Risk Score and Plan: 3 and Ondansetron, Dexamethasone, Midazolam and Treatment may vary due to age or medical condition  Airway Management Planned: Oral  ETT  Additional Equipment: Arterial line, CVP, TEE and Ultrasound Guidance Line Placement  Intra-op Plan:   Post-operative Plan: Post-operative intubation/ventilation  Informed Consent: I have reviewed the patients History and Physical, chart, labs and discussed the procedure including the risks, benefits and alternatives for the proposed anesthesia with the patient or authorized representative who has indicated his/her understanding and acceptance.     Dental advisory given  Plan Discussed with: CRNA  Anesthesia Plan Comments:        Anesthesia Quick Evaluation

## 2020-05-19 NOTE — Progress Notes (Addendum)
Progress Note  Patient Name: Donna Sloan Date of Encounter: 05/19/2020  Primary Cardiologist: Quay Burow, MD  Subjective   No CP this AM. Feeling fine, just not sleeping well with interruptions. Plan for CABG tomorrow.  Inpatient Medications    Scheduled Meds: . aspirin  81 mg Oral Daily  . atorvastatin  80 mg Oral Daily  . lisinopril  10 mg Oral Daily  . metoprolol tartrate  25 mg Oral BID  . pantoprazole  40 mg Oral Daily  . sodium chloride flush  3 mL Intravenous Q12H   Continuous Infusions: . sodium chloride    . heparin 1,000 Units/hr (05/18/20 0958)   PRN Meds: sodium chloride, acetaminophen, alum & mag hydroxide-simeth, morphine injection, nitroGLYCERIN, ondansetron (ZOFRAN) IV, sodium chloride flush   Vital Signs    Vitals:   05/18/20 1241 05/18/20 2124 05/19/20 0541 05/19/20 0748  BP: 106/70 (!) 115/51 (!) 128/53 (!) 127/43  Pulse: 73 75 67 78  Resp: 20 16 18 18   Temp: 98.4 F (36.9 C) 98.2 F (36.8 C) 98.1 F (36.7 C)   TempSrc: Oral Oral Oral   SpO2: 98% 99%  94%  Weight:   84.7 kg   Height:        Intake/Output Summary (Last 24 hours) at 05/19/2020 0836 Last data filed at 05/18/2020 1800 Gross per 24 hour  Intake 311.55 ml  Output 201 ml  Net 110.55 ml   Last 3 Weights 05/19/2020 05/18/2020 05/17/2020  Weight (lbs) 186 lb 11.2 oz 187 lb 1.6 oz 186 lb 12.8 oz  Weight (kg) 84.687 kg 84.868 kg 84.732 kg     Telemetry    NSR (brief sinus tach 100-110 while standing) - Personally Reviewed  Physical Exam   GEN: No acute distress.  HEENT: Normocephalic, atraumatic, sclera non-icteric. Neck: No JVD or bruits. Cardiac: RRR no murmurs, rubs, or gallops.  Radials/DP/PT 1+ and equal bilaterally.  Respiratory: Clear to auscultation bilaterally. Breathing is unlabored. GI: Soft, nontender, non-distended, BS +x 4. MS: no deformity. Extremities: No clubbing or cyanosis. No edema. Distal pedal pulses are 2+ and equal bilaterally. Right  radial cath site without hematoma or ecchymosis; good pulse. Neuro:  AAOx3. Follows commands. Psych:  Responds to questions appropriately with a normal affect.  Labs    High Sensitivity Troponin:   Recent Labs  Lab 05/14/20 1308 05/14/20 1545 05/14/20 2113  TROPONINIHS 259* 932* 4,696*      Cardiac EnzymesNo results for input(s): TROPONINI in the last 168 hours. No results for input(s): TROPIPOC in the last 168 hours.   Chemistry Recent Labs  Lab 05/16/20 0959 05/18/20 0846 05/19/20 0432  NA 140 138 141  K 3.5 4.0 3.9  CL 108 107 108  CO2 23 21* 24  GLUCOSE 94 153* 94  BUN 15 20 22   CREATININE 1.07* 1.13* 1.08*  CALCIUM 9.8 9.5 9.4  GFRNONAA 50* 47* 49*  ANIONGAP 9 10 9      Hematology Recent Labs  Lab 05/17/20 0302 05/18/20 0310 05/19/20 0432  WBC 8.0 6.8 6.3  RBC 4.65 4.31 4.31  HGB 13.6 12.9 12.7  HCT 42.2 39.5 39.4  MCV 90.8 91.6 91.4  MCH 29.2 29.9 29.5  MCHC 32.2 32.7 32.2  RDW 13.0 12.8 12.9  PLT 153 144* 146*    BNPNo results for input(s): BNP, PROBNP in the last 168 hours.   DDimer No results for input(s): DDIMER in the last 168 hours.   Radiology    No results found.  Cardiac  Studies   LHC 05/15/20 Conclusion    Ost LM to Mid LM lesion is 60% stenosed.  Ost Cx lesion is 70% stenosed.  Prox LAD to Mid LAD lesion is 80% stenosed.  Ost LAD to Prox LAD lesion is 70% stenosed.  The left ventricular systolic function is normal.  LV end diastolic pressure is normal.  The left ventricular ejection fraction is 55-65% by visual estimate.   Donna Sloan is a 79 y.o. female    [id number] LOCATION:  FACILITY: [facility]  PHYSICIAN: Quay Burow, M.D. 04-Jun-1941   DATE OF PROCEDURE:  05/15/2020  DATE OF DISCHARGE:     CARDIAC CATHETERIZATION     History obtained from chart review.Donna Sloan a 79 y.o.femalewith a hx of PAFnot on AC, OSA not on CPAP, HTN, HLD, and RLSwho was admitted with unstable  angina.  Her EKG showed nonspecific changes.  She attributed the symptoms to reflux however her enzymes went up to 900 and she was referred for diagnostic coronary angiography.   IMPRESSION: Donna Sloan was admitted with non-STEMI.  She has moderate left main with long segmental ostial/proximal LAD disease and ostial circumflex disease in the left dominant system.  She has normal LV function.  I suspect long-term she would be best served with complete revascularization with CABG as opposed to long stented segment in the LAD.  I reviewed these films with Dr. Claiborne Billings who concurs.  The sheath was removed and a TR band was placed on the right wrist to achieve patent hemostasis.  The patient left lab in stable condition.  I will restart IV heparin 4 hours after sheath removal.  Quay Burow. MD, [facility] 05/15/2020 8:41 AM  2D echo 05/16/20 IMPRESSIONS   1. Left ventricular ejection fraction, by estimation, is 60 to 65%. The  left ventricle has normal function. The left ventricle has no regional  wall motion abnormalities. There is mild left ventricular hypertrophy.  Left ventricular diastolic parameters  are consistent with Grade I diastolic dysfunction (impaired relaxation).  2. Right ventricular systolic function is normal. The right ventricular  size is normal. There is normal pulmonary artery systolic pressure. The  estimated right ventricular systolic pressure is 02.7 mmHg.  3. The mitral valve is degenerative. No evidence of mitral valve  regurgitation. No evidence of mitral stenosis. Severe mitral annular  calcification.  4. The aortic valve is abnormal. There is moderate calcification of the  aortic valve. Aortic valve regurgitation is not visualized. Mild to  moderate aortic valve sclerosis/calcification is present, without any  evidence of aortic stenosis.  5. The inferior vena cava is normal in size with greater than 50%  respiratory variability, suggesting right atrial pressure  of 3 mmHg.     Patient Profile     80 y.o. female with remote PAF not on [place] prior to admission, OSA (pt did not f/u with CPAP titration), HTN, HLD, RLS who presented to [facility] with chest pain/NSTEMI, found to have multivessel CAD requiring CABG planned 05/20/20. She stopped taking all medications about a year ago.  Assessment & Plan   1. NSTEMI/multivessel CAD - plan for CABG 05/20/20 - continue ASA, IV heparin, statin, metoprolol, ACEi - consider discharge on DAPT given MI (hsTroponin trended peak was 4,696)  2. Essential HTN - BP controlled  3. Hyperlipidemia - LDL 136, goal <70 - continue high dose statin and anticipate OP f/u labs - obtain baseline LFTs in AM  4. Remote history of paroxysmal atrial fibrillation - CHADSVASC is 5 therefore  anticoagulation should be considered if recurrence is documented  - get baseline TSH with AM labs  5. Mild AKI superimposed on likely CKD stage III - Cr peak 1.24, back to near prior baseline at 1.08  6. OSA - remote diagnosis in 2012, did not f/u at that time - recommend revisiting as OP  7. Mild thrombocytopenia - marginal platelet decrease to 146 but stable since yesterday - f/u CBC in AM  For questions or updates, please contact [place] Please consult [email or web address] for contact info under Cardiology/STEMI.  Signed, Charlie Pitter, PA-C 05/19/2020, 8:36 AM    Patient seen and examined  I agree with findings as noted above by D Dunn Pt denies CP  Remains on heparin  On exam Lungs are CTA Cardiac RRR  No S3 Abd is supple  Ext are without edema  Plan for CABG tomorrow for severe CAD   Will follow with surgery   With presentation would d/c on plavix and asa after if possible   Dorris Carnes MD

## 2020-05-19 NOTE — Progress Notes (Signed)
ANTICOAGULATION CONSULT NOTE - Follow-Up  Pharmacy Consult for heparin Indication: NSTEMI, multivessel CAD  Allergies  Allergen Reactions  . Penicillins     Has patient had a PCN reaction causing immediate rash, facial/tongue/throat swelling, SOB or lightheadedness with hypotension: Yes Has patient had a PCN reaction causing severe rash involving mucus membranes or skin necrosis: No Has patient had a PCN reaction that required hospitalization: No Has patient had a PCN reaction occurring within the last 10 years: No If all of the above answers are "NO", then may proceed with Cephalosporin use.  . Rosuvastatin Nausea And Vomiting  . Simvastatin Nausea And Vomiting    Patient Measurements: Height: 5\' 2"  (157.5 cm) Weight: 84.7 kg (186 lb 11.2 oz) (scale c) IBW/kg (Calculated) : 50.1 Heparin Dosing Weight: 67.3kg  Vital Signs: Temp: 98.1 F (36.7 C) (10/18 0541) Temp Source: Oral (10/18 0541) BP: 127/43 (10/18 0748) Pulse Rate: 78 (10/18 0748)  Labs: Recent Labs    05/16/20 0959 05/16/20 0959 05/17/20 0302 05/17/20 0302 05/18/20 0310 05/18/20 0846 05/19/20 0432  HGB 13.9   < > 13.6   < > 12.9  --  12.7  HCT 42.7   < > 42.2  --  39.5  --  39.4  PLT 173   < > 153  --  144*  --  146*  HEPARINUNFRC 0.33   < > 0.41  --  0.30  --  0.64  CREATININE 1.07*  --   --   --   --  1.13* 1.08*   < > = values in this interval not displayed.    Estimated Creatinine Clearance: 43.3 mL/min (A) (by C-G formula based on SCr of 1.08 mg/dL (H)).  Assessment: 79 yo F presenting with CP, elevated troponin and concern for NSTEMI, not on anticoagulation PTA. She is s/p cath and shown to have multivessel CAD - TCTS consulted for CABG, scheduled for 10/19. Pharmacy consulted to resume IV heparin.   Heparin level remains at goal. No bleeding noted, CBC is normal.  Goal of Therapy:  Heparin level 0.3-0.7 units/ml Monitor platelets by anticoagulation protocol: Yes   Plan:  Continue heparin at  1000 units/hr  Daily heparin level, CBC Monitor for s/sx of bleeding CABG planned for 10/19   Arturo Morton, PharmD, BCPS Please check AMION for all [place] contact numbers Clinical Pharmacist 05/19/2020 8:31 AM

## 2020-05-20 ENCOUNTER — Inpatient Hospital Stay (HOSPITAL_COMMUNITY): Payer: Medicare Other | Admitting: Registered Nurse

## 2020-05-20 ENCOUNTER — Inpatient Hospital Stay (HOSPITAL_COMMUNITY)
Admission: EM | Disposition: A | Discharge status: Home / Self Care | Payer: Self-pay | Source: Home / Self Care | Attending: Internal Medicine

## 2020-05-20 ENCOUNTER — Inpatient Hospital Stay (HOSPITAL_COMMUNITY): Payer: Medicare Other

## 2020-05-20 ENCOUNTER — Encounter (HOSPITAL_COMMUNITY): Payer: Self-pay | Admitting: Internal Medicine

## 2020-05-20 DIAGNOSIS — I25110 Atherosclerotic heart disease of native coronary artery with unstable angina pectoris: Secondary | ICD-10-CM | POA: Diagnosis not present

## 2020-05-20 DIAGNOSIS — Z951 Presence of aortocoronary bypass graft: Secondary | ICD-10-CM

## 2020-05-20 HISTORY — PX: TEE WITHOUT CARDIOVERSION: SHX5443

## 2020-05-20 HISTORY — PX: CORONARY ARTERY BYPASS GRAFT: SHX141

## 2020-05-20 LAB — CBC
HCT: 40.4 % (ref 36.0–46.0)
HCT: 28.4 % — ABNORMAL LOW (ref 36.0–46.0)
HCT: 30.3 % — ABNORMAL LOW (ref 36.0–46.0)
Hemoglobin: 13.2 g/dL (ref 12.0–15.0)
Hemoglobin: 9.3 g/dL — ABNORMAL LOW (ref 12.0–15.0)
Hemoglobin: 9.8 g/dL — ABNORMAL LOW (ref 12.0–15.0)
MCH: 30.1 pg (ref 26.0–34.0)
MCH: 30.5 pg (ref 26.0–34.0)
MCH: 30.3 pg (ref 26.0–34.0)
MCHC: 32.7 g/dL (ref 30.0–36.0)
MCHC: 32.7 g/dL (ref 30.0–36.0)
MCHC: 32.3 g/dL (ref 30.0–36.0)
MCV: 92 fL (ref 80.0–100.0)
MCV: 93.1 fL (ref 80.0–100.0)
MCV: 93.8 fL (ref 80.0–100.0)
Platelets: 162 10*3/uL (ref 150–400)
Platelets: 75 10*3/uL — ABNORMAL LOW (ref 150–400)
Platelets: 86 10*3/uL — ABNORMAL LOW (ref 150–400)
RBC: 4.39 MIL/uL (ref 3.87–5.11)
RBC: 3.05 MIL/uL — ABNORMAL LOW (ref 3.87–5.11)
RBC: 3.23 MIL/uL — ABNORMAL LOW (ref 3.87–5.11)
RDW: 12.9 % (ref 11.5–15.5)
RDW: 12.8 % (ref 11.5–15.5)
RDW: 12.9 % (ref 11.5–15.5)
WBC: 6.8 10*3/uL (ref 4.0–10.5)
WBC: 6.5 10*3/uL (ref 4.0–10.5)
WBC: 7.6 10*3/uL (ref 4.0–10.5)
nRBC: 0 % (ref 0.0–0.2)
nRBC: 0 % (ref 0.0–0.2)
nRBC: 0 % (ref 0.0–0.2)

## 2020-05-20 LAB — POCT I-STAT 7, (LYTES, BLD GAS, ICA,H+H)
Acid-Base Excess: 4 mmol/L — ABNORMAL HIGH (ref 0.0–2.0)
Acid-Base Excess: 4 mmol/L — ABNORMAL HIGH (ref 0.0–2.0)
Acid-base deficit: 2 mmol/L (ref 0.0–2.0)
Acid-base deficit: 5 mmol/L — ABNORMAL HIGH (ref 0.0–2.0)
Acid-base deficit: 4 mmol/L — ABNORMAL HIGH (ref 0.0–2.0)
Acid-base deficit: 5 mmol/L — ABNORMAL HIGH (ref 0.0–2.0)
Bicarbonate: 27.3 mmol/L (ref 20.0–28.0)
Bicarbonate: 27 mmol/L (ref 20.0–28.0)
Bicarbonate: 23.2 mmol/L (ref 20.0–28.0)
Bicarbonate: 21.3 mmol/L (ref 20.0–28.0)
Bicarbonate: 21.9 mmol/L (ref 20.0–28.0)
Bicarbonate: 20.4 mmol/L (ref 20.0–28.0)
Calcium, Ion: 0.98 mmol/L — ABNORMAL LOW (ref 1.15–1.40)
Calcium, Ion: 1.28 mmol/L (ref 1.15–1.40)
Calcium, Ion: 1.28 mmol/L (ref 1.15–1.40)
Calcium, Ion: 1.22 mmol/L (ref 1.15–1.40)
Calcium, Ion: 1.22 mmol/L (ref 1.15–1.40)
Calcium, Ion: 1.2 mmol/L (ref 1.15–1.40)
HCT: 24 % — ABNORMAL LOW (ref 36.0–46.0)
HCT: 26 % — ABNORMAL LOW (ref 36.0–46.0)
HCT: 24 % — ABNORMAL LOW (ref 36.0–46.0)
HCT: 26 % — ABNORMAL LOW (ref 36.0–46.0)
HCT: 26 % — ABNORMAL LOW (ref 36.0–46.0)
HCT: 26 % — ABNORMAL LOW (ref 36.0–46.0)
Hemoglobin: 8.2 g/dL — ABNORMAL LOW (ref 12.0–15.0)
Hemoglobin: 8.8 g/dL — ABNORMAL LOW (ref 12.0–15.0)
Hemoglobin: 8.2 g/dL — ABNORMAL LOW (ref 12.0–15.0)
Hemoglobin: 8.8 g/dL — ABNORMAL LOW (ref 12.0–15.0)
Hemoglobin: 8.8 g/dL — ABNORMAL LOW (ref 12.0–15.0)
Hemoglobin: 8.8 g/dL — ABNORMAL LOW (ref 12.0–15.0)
O2 Saturation: 100 %
O2 Saturation: 100 %
O2 Saturation: 99 %
O2 Saturation: 97 %
O2 Saturation: 98 %
O2 Saturation: 98 %
Patient temperature: 98.4
Patient temperature: 35.7
Patient temperature: 36.2
Patient temperature: 37
Potassium: 3.8 mmol/L (ref 3.5–5.1)
Potassium: 4.3 mmol/L (ref 3.5–5.1)
Potassium: 4 mmol/L (ref 3.5–5.1)
Potassium: 4 mmol/L (ref 3.5–5.1)
Potassium: 4 mmol/L (ref 3.5–5.1)
Potassium: 3.7 mmol/L (ref 3.5–5.1)
Sodium: 142 mmol/L (ref 135–145)
Sodium: 142 mmol/L (ref 135–145)
Sodium: 143 mmol/L (ref 135–145)
Sodium: 143 mmol/L (ref 135–145)
Sodium: 142 mmol/L (ref 135–145)
Sodium: 143 mmol/L (ref 135–145)
TCO2: 28 mmol/L (ref 22–32)
TCO2: 28 mmol/L (ref 22–32)
TCO2: 24 mmol/L (ref 22–32)
TCO2: 23 mmol/L (ref 22–32)
TCO2: 23 mmol/L (ref 22–32)
TCO2: 21 mmol/L — ABNORMAL LOW (ref 22–32)
pCO2 arterial: 33.7 mmHg (ref 32.0–48.0)
pCO2 arterial: 32.9 mmHg (ref 32.0–48.0)
pCO2 arterial: 38.2 mmHg (ref 32.0–48.0)
pCO2 arterial: 40.3 mmHg (ref 32.0–48.0)
pCO2 arterial: 39.2 mmHg (ref 32.0–48.0)
pCO2 arterial: 36.8 mmHg (ref 32.0–48.0)
pH, Arterial: 7.517 — ABNORMAL HIGH (ref 7.350–7.450)
pH, Arterial: 7.523 — ABNORMAL HIGH (ref 7.350–7.450)
pH, Arterial: 7.39 (ref 7.350–7.450)
pH, Arterial: 7.324 — ABNORMAL LOW (ref 7.350–7.450)
pH, Arterial: 7.351 (ref 7.350–7.450)
pH, Arterial: 7.352 (ref 7.350–7.450)
pO2, Arterial: 411 mmHg — ABNORMAL HIGH (ref 83.0–108.0)
pO2, Arterial: 537 mmHg — ABNORMAL HIGH (ref 83.0–108.0)
pO2, Arterial: 127 mmHg — ABNORMAL HIGH (ref 83.0–108.0)
pO2, Arterial: 96 mmHg (ref 83.0–108.0)
pO2, Arterial: 105 mmHg (ref 83.0–108.0)
pO2, Arterial: 108 mmHg (ref 83.0–108.0)

## 2020-05-20 LAB — POCT I-STAT EG7
Acid-Base Excess: 2 mmol/L (ref 0.0–2.0)
Bicarbonate: 25.5 mmol/L (ref 20.0–28.0)
Calcium, Ion: 1.03 mmol/L — ABNORMAL LOW (ref 1.15–1.40)
HCT: 24 % — ABNORMAL LOW (ref 36.0–46.0)
Hemoglobin: 8.2 g/dL — ABNORMAL LOW (ref 12.0–15.0)
O2 Saturation: 74 %
Potassium: 3.9 mmol/L (ref 3.5–5.1)
Sodium: 143 mmol/L (ref 135–145)
TCO2: 27 mmol/L (ref 22–32)
pCO2, Ven: 35.4 mmHg — ABNORMAL LOW (ref 44.0–60.0)
pH, Ven: 7.465 — ABNORMAL HIGH (ref 7.250–7.430)
pO2, Ven: 36 mmHg (ref 32.0–45.0)

## 2020-05-20 LAB — POCT I-STAT, CHEM 8
BUN: 21 mg/dL (ref 8–23)
BUN: 20 mg/dL (ref 8–23)
BUN: 19 mg/dL (ref 8–23)
BUN: 19 mg/dL (ref 8–23)
Calcium, Ion: 1.32 mmol/L (ref 1.15–1.40)
Calcium, Ion: 1.27 mmol/L (ref 1.15–1.40)
Calcium, Ion: 1.11 mmol/L — ABNORMAL LOW (ref 1.15–1.40)
Calcium, Ion: 1.31 mmol/L (ref 1.15–1.40)
Chloride: 107 mmol/L (ref 98–111)
Chloride: 105 mmol/L (ref 98–111)
Chloride: 105 mmol/L (ref 98–111)
Chloride: 108 mmol/L (ref 98–111)
Creatinine, Ser: 0.9 mg/dL (ref 0.44–1.00)
Creatinine, Ser: 0.9 mg/dL (ref 0.44–1.00)
Creatinine, Ser: 0.8 mg/dL (ref 0.44–1.00)
Creatinine, Ser: 0.7 mg/dL (ref 0.44–1.00)
Glucose, Bld: 93 mg/dL (ref 70–99)
Glucose, Bld: 107 mg/dL — ABNORMAL HIGH (ref 70–99)
Glucose, Bld: 106 mg/dL — ABNORMAL HIGH (ref 70–99)
Glucose, Bld: 115 mg/dL — ABNORMAL HIGH (ref 70–99)
HCT: 35 % — ABNORMAL LOW (ref 36.0–46.0)
HCT: 32 % — ABNORMAL LOW (ref 36.0–46.0)
HCT: 24 % — ABNORMAL LOW (ref 36.0–46.0)
HCT: 24 % — ABNORMAL LOW (ref 36.0–46.0)
Hemoglobin: 11.9 g/dL — ABNORMAL LOW (ref 12.0–15.0)
Hemoglobin: 10.9 g/dL — ABNORMAL LOW (ref 12.0–15.0)
Hemoglobin: 8.2 g/dL — ABNORMAL LOW (ref 12.0–15.0)
Hemoglobin: 8.2 g/dL — ABNORMAL LOW (ref 12.0–15.0)
Potassium: 4.1 mmol/L (ref 3.5–5.1)
Potassium: 3.7 mmol/L (ref 3.5–5.1)
Potassium: 4.5 mmol/L (ref 3.5–5.1)
Potassium: 4.3 mmol/L (ref 3.5–5.1)
Sodium: 143 mmol/L (ref 135–145)
Sodium: 140 mmol/L (ref 135–145)
Sodium: 140 mmol/L (ref 135–145)
Sodium: 141 mmol/L (ref 135–145)
TCO2: 26 mmol/L (ref 22–32)
TCO2: 24 mmol/L (ref 22–32)
TCO2: 28 mmol/L (ref 22–32)
TCO2: 28 mmol/L (ref 22–32)

## 2020-05-20 LAB — BASIC METABOLIC PANEL
Anion gap: 7 (ref 5–15)
BUN: 15 mg/dL (ref 8–23)
CO2: 23 mmol/L (ref 22–32)
Calcium: 8.8 mg/dL — ABNORMAL LOW (ref 8.9–10.3)
Chloride: 108 mmol/L (ref 98–111)
Creatinine, Ser: 0.99 mg/dL (ref 0.44–1.00)
GFR, Estimated: 55 mL/min — ABNORMAL LOW (ref >60)
Glucose, Bld: 143 mg/dL — ABNORMAL HIGH (ref 70–99)
Potassium: 4.4 mmol/L (ref 3.5–5.1)
Sodium: 138 mmol/L (ref 135–145)

## 2020-05-20 LAB — MAGNESIUM: Magnesium: 3.3 mg/dL — ABNORMAL HIGH (ref 1.7–2.4)

## 2020-05-20 LAB — COMPREHENSIVE METABOLIC PANEL
ALT: 37 U/L (ref 0–44)
AST: 37 U/L (ref 15–41)
Albumin: 3.2 g/dL — ABNORMAL LOW (ref 3.5–5.0)
Alkaline Phosphatase: 83 U/L (ref 38–126)
Anion gap: 10 (ref 5–15)
BUN: 22 mg/dL (ref 8–23)
CO2: 23 mmol/L (ref 22–32)
Calcium: 9.5 mg/dL (ref 8.9–10.3)
Chloride: 107 mmol/L (ref 98–111)
Creatinine, Ser: 1.19 mg/dL — ABNORMAL HIGH (ref 0.44–1.00)
GFR, Estimated: 44 mL/min — ABNORMAL LOW (ref >60)
Glucose, Bld: 125 mg/dL — ABNORMAL HIGH (ref 70–99)
Potassium: 3.7 mmol/L (ref 3.5–5.1)
Sodium: 140 mmol/L (ref 135–145)
Total Bilirubin: 0.4 mg/dL (ref 0.3–1.2)
Total Protein: 6.2 g/dL — ABNORMAL LOW (ref 6.5–8.1)

## 2020-05-20 LAB — ECHO INTRAOPERATIVE TEE
AR max vel: 1.67 cm2
AV Area VTI: 1.93 cm2
AV Area mean vel: 1.78 cm2
AV Mean grad: 7 mmHg
AV Peak grad: 13.2 mmHg
Ao pk vel: 1.82 m/s
Height: 62 in
S' Lateral: 2 cm
Weight: 2985.6 oz

## 2020-05-20 LAB — HEMOGLOBIN AND HEMATOCRIT, BLOOD
HCT: 24.6 % — ABNORMAL LOW (ref 36.0–46.0)
Hemoglobin: 8.3 g/dL — ABNORMAL LOW (ref 12.0–15.0)

## 2020-05-20 LAB — GLUCOSE, CAPILLARY
Glucose-Capillary: 121 mg/dL — ABNORMAL HIGH (ref 70–99)
Glucose-Capillary: 126 mg/dL — ABNORMAL HIGH (ref 70–99)
Glucose-Capillary: 126 mg/dL — ABNORMAL HIGH (ref 70–99)
Glucose-Capillary: 157 mg/dL — ABNORMAL HIGH (ref 70–99)
Glucose-Capillary: 129 mg/dL — ABNORMAL HIGH (ref 70–99)
Glucose-Capillary: 115 mg/dL — ABNORMAL HIGH (ref 70–99)
Glucose-Capillary: 114 mg/dL — ABNORMAL HIGH (ref 70–99)

## 2020-05-20 LAB — APTT: aPTT: 33 seconds (ref 24–36)

## 2020-05-20 LAB — HEPARIN LEVEL (UNFRACTIONATED): Heparin Unfractionated: 0.47 IU/mL (ref 0.30–0.70)

## 2020-05-20 LAB — PLATELET COUNT: Platelets: 110 10*3/uL — ABNORMAL LOW (ref 150–400)

## 2020-05-20 LAB — PROTIME-INR
INR: 1.5 — ABNORMAL HIGH (ref 0.8–1.2)
Prothrombin Time: 17.6 seconds — ABNORMAL HIGH (ref 11.4–15.2)

## 2020-05-20 SURGERY — CORONARY ARTERY BYPASS GRAFTING (CABG)
Anesthesia: General | Site: Chest

## 2020-05-20 MED ORDER — HEPARIN SODIUM (PORCINE) 1000 UNIT/ML IJ SOLN
INTRAMUSCULAR | Status: DC | PRN
  Administered 2020-05-20: 25000 [IU] via INTRAVENOUS

## 2020-05-20 MED ORDER — ROCURONIUM BROMIDE 10 MG/ML (PF) SYRINGE
PREFILLED_SYRINGE | INTRAVENOUS | Status: DC | PRN
  Administered 2020-05-20: 30 mg via INTRAVENOUS
  Administered 2020-05-20: 50 mg via INTRAVENOUS
  Administered 2020-05-20: 100 mg via INTRAVENOUS
  Administered 2020-05-20: 20 mg via INTRAVENOUS

## 2020-05-20 MED ORDER — POTASSIUM CHLORIDE 10 MEQ/50ML IV SOLN
10.0000 meq | INTRAVENOUS | Status: AC
  Administered 2020-05-21 (×3): 10 meq via INTRAVENOUS

## 2020-05-20 MED ORDER — OXYCODONE HCL 5 MG PO TABS
5.0000 mg | ORAL_TABLET | ORAL | Status: DC | PRN
  Administered 2020-05-21 (×3): 5 mg via ORAL
  Filled 2020-05-20 (×3): qty 1

## 2020-05-20 MED ORDER — LACTATED RINGERS IV SOLN: INTRAVENOUS | Status: DC

## 2020-05-20 MED ORDER — PROTAMINE SULFATE 10 MG/ML IV SOLN
INTRAVENOUS | Status: DC | PRN
  Administered 2020-05-20: 250 mg via INTRAVENOUS

## 2020-05-20 MED ORDER — INSULIN REGULAR(HUMAN) IN NACL 100-0.9 UT/100ML-% IV SOLN
INTRAVENOUS | Status: DC
  Administered 2020-05-21: 0.6 [IU]/h via INTRAVENOUS

## 2020-05-20 MED ORDER — PROTAMINE SULFATE 10 MG/ML IV SOLN
INTRAVENOUS | Status: AC
  Filled 2020-05-20: qty 25

## 2020-05-20 MED ORDER — TRAMADOL HCL 50 MG PO TABS
50.0000 mg | ORAL_TABLET | ORAL | Status: DC | PRN
  Administered 2020-05-21 (×2): 50 mg via ORAL
  Administered 2020-05-21: 100 mg via ORAL
  Filled 2020-05-20: qty 1
  Filled 2020-05-20: qty 2
  Filled 2020-05-20: qty 1

## 2020-05-20 MED ORDER — SODIUM CHLORIDE 0.9% FLUSH
3.0000 mL | Freq: Two times a day (BID) | INTRAVENOUS | Status: DC
  Administered 2020-05-21 – 2020-05-25 (×7): 3 mL via INTRAVENOUS

## 2020-05-20 MED ORDER — ASPIRIN EC 325 MG PO TBEC
325.0000 mg | DELAYED_RELEASE_TABLET | Freq: Every day | ORAL | Status: DC
  Administered 2020-05-21 – 2020-05-25 (×5): 325 mg via ORAL
  Filled 2020-05-20 (×5): qty 1

## 2020-05-20 MED ORDER — NOREPINEPHRINE 4 MG/250ML-% IV SOLN: 0.0000 ug/min | INTRAVENOUS | Status: DC

## 2020-05-20 MED ORDER — ALBUMIN HUMAN 5 % IV SOLN
250.0000 mL | INTRAVENOUS | Status: AC | PRN
  Administered 2020-05-20 – 2020-05-21 (×4): 12.5 g via INTRAVENOUS
  Filled 2020-05-20 (×4): qty 250

## 2020-05-20 MED ORDER — SODIUM CHLORIDE 0.9 % IV SOLN: INTRAVENOUS | Status: DC

## 2020-05-20 MED ORDER — LACTATED RINGERS IV SOLN: INTRAVENOUS | Status: DC | PRN

## 2020-05-20 MED ORDER — DOCUSATE SODIUM 100 MG PO CAPS
200.0000 mg | ORAL_CAPSULE | Freq: Every day | ORAL | Status: DC
  Administered 2020-05-21 – 2020-05-24 (×3): 200 mg via ORAL
  Filled 2020-05-20 (×3): qty 2

## 2020-05-20 MED ORDER — NICARDIPINE HCL IN NACL 20-0.86 MG/200ML-% IV SOLN: 3.0000 mg/h | INTRAVENOUS | Status: DC

## 2020-05-20 MED ORDER — CHLORHEXIDINE GLUCONATE 0.12% ORAL RINSE (MEDLINE KIT)
15.0000 mL | Freq: Two times a day (BID) | OROMUCOSAL | Status: DC
  Administered 2020-05-20: 15 mL via OROMUCOSAL

## 2020-05-20 MED ORDER — ORAL CARE MOUTH RINSE
15.0000 mL | OROMUCOSAL | Status: DC
  Administered 2020-05-20 (×4): 15 mL via OROMUCOSAL

## 2020-05-20 MED ORDER — LEVOFLOXACIN IN D5W 750 MG/150ML IV SOLN
750.0000 mg | INTRAVENOUS | Status: AC
  Administered 2020-05-21: 750 mg via INTRAVENOUS
  Filled 2020-05-20: qty 150

## 2020-05-20 MED ORDER — DOBUTAMINE IN D5W 4-5 MG/ML-% IV SOLN: 2.5000 ug/kg/min | INTRAVENOUS | Status: DC

## 2020-05-20 MED ORDER — EPHEDRINE SULFATE-NACL 50-0.9 MG/10ML-% IV SOSY
PREFILLED_SYRINGE | INTRAVENOUS | Status: DC | PRN
  Administered 2020-05-20: 20 mg via INTRAVENOUS
  Administered 2020-05-20 (×3): 10 mg via INTRAVENOUS

## 2020-05-20 MED ORDER — DEXTROSE 50 % IV SOLN: 0.0000 mL | INTRAVENOUS | Status: DC | PRN

## 2020-05-20 MED ORDER — METOPROLOL TARTRATE 25 MG/10 ML ORAL SUSPENSION
12.5000 mg | Freq: Two times a day (BID) | ORAL | Status: DC
  Filled 2020-05-20: qty 5

## 2020-05-20 MED ORDER — CHLORHEXIDINE GLUCONATE CLOTH 2 % EX PADS
6.0000 | MEDICATED_PAD | Freq: Every day | CUTANEOUS | Status: DC
  Administered 2020-05-20 – 2020-05-24 (×3): 6 via TOPICAL

## 2020-05-20 MED ORDER — ACETAMINOPHEN 160 MG/5ML PO SOLN: 1000.0000 mg | Freq: Four times a day (QID) | ORAL | Status: DC

## 2020-05-20 MED ORDER — PHENYLEPHRINE 40 MCG/ML (10ML) SYRINGE FOR IV PUSH (FOR BLOOD PRESSURE SUPPORT)
PREFILLED_SYRINGE | INTRAVENOUS | Status: DC | PRN
  Administered 2020-05-20: 80 ug via INTRAVENOUS
  Administered 2020-05-20: 40 ug via INTRAVENOUS

## 2020-05-20 MED ORDER — ACETAMINOPHEN 500 MG PO TABS
1000.0000 mg | ORAL_TABLET | Freq: Four times a day (QID) | ORAL | Status: DC
  Administered 2020-05-21 – 2020-05-25 (×14): 1000 mg via ORAL
  Filled 2020-05-20 (×15): qty 2

## 2020-05-20 MED ORDER — ACETAMINOPHEN 160 MG/5ML PO SOLN: 650.0000 mg | Freq: Once | ORAL | Status: AC

## 2020-05-20 MED ORDER — MIDAZOLAM HCL 2 MG/2ML IJ SOLN: 2.0000 mg | INTRAMUSCULAR | Status: DC | PRN

## 2020-05-20 MED ORDER — BISACODYL 5 MG PO TBEC
10.0000 mg | DELAYED_RELEASE_TABLET | Freq: Every day | ORAL | Status: DC
  Administered 2020-05-21 – 2020-05-24 (×3): 10 mg via ORAL
  Filled 2020-05-20 (×4): qty 2

## 2020-05-20 MED ORDER — DEXMEDETOMIDINE HCL IN NACL 400 MCG/100ML IV SOLN: 0.0000 ug/kg/h | INTRAVENOUS | Status: DC

## 2020-05-20 MED ORDER — BISACODYL 10 MG RE SUPP: 10.0000 mg | Freq: Every day | RECTAL | Status: DC

## 2020-05-20 MED ORDER — PLASMA-LYTE 148 IV SOLN
INTRAVENOUS | Status: DC | PRN
  Administered 2020-05-20: 500 mL via INTRAVASCULAR

## 2020-05-20 MED ORDER — CHLORHEXIDINE GLUCONATE 0.12 % MT SOLN
15.0000 mL | OROMUCOSAL | Status: AC
  Administered 2020-05-20: 15 mL via OROMUCOSAL

## 2020-05-20 MED ORDER — PHENYLEPHRINE 40 MCG/ML (10ML) SYRINGE FOR IV PUSH (FOR BLOOD PRESSURE SUPPORT)
PREFILLED_SYRINGE | INTRAVENOUS | Status: AC
  Filled 2020-05-20: qty 10

## 2020-05-20 MED ORDER — SODIUM CHLORIDE 0.9% FLUSH: 3.0000 mL | INTRAVENOUS | Status: DC | PRN

## 2020-05-20 MED ORDER — ATORVASTATIN CALCIUM 80 MG PO TABS
80.0000 mg | ORAL_TABLET | Freq: Every day | ORAL | Status: DC
  Administered 2020-05-21 – 2020-05-25 (×5): 80 mg via ORAL
  Filled 2020-05-20 (×6): qty 1

## 2020-05-20 MED ORDER — SODIUM CHLORIDE 0.9% FLUSH
10.0000 mL | Freq: Two times a day (BID) | INTRAVENOUS | Status: DC
  Administered 2020-05-20: 20 mL
  Administered 2020-05-20 – 2020-05-22 (×3): 10 mL

## 2020-05-20 MED ORDER — LACTATED RINGERS IV SOLN
500.0000 mL | Freq: Once | INTRAVENOUS | Status: AC | PRN
  Administered 2020-05-20: 1000 mL via INTRAVENOUS

## 2020-05-20 MED ORDER — ONDANSETRON HCL 4 MG/2ML IJ SOLN
4.0000 mg | Freq: Four times a day (QID) | INTRAMUSCULAR | Status: DC | PRN
  Administered 2020-05-20 – 2020-05-23 (×5): 4 mg via INTRAVENOUS
  Filled 2020-05-20 (×5): qty 2

## 2020-05-20 MED ORDER — PANTOPRAZOLE SODIUM 40 MG PO TBEC
40.0000 mg | DELAYED_RELEASE_TABLET | Freq: Every day | ORAL | Status: DC
  Administered 2020-05-22 – 2020-05-25 (×4): 40 mg via ORAL
  Filled 2020-05-20 (×4): qty 1

## 2020-05-20 MED ORDER — VANCOMYCIN HCL IN DEXTROSE 1-5 GM/200ML-% IV SOLN
1000.0000 mg | Freq: Once | INTRAVENOUS | Status: AC
  Administered 2020-05-20: 1000 mg via INTRAVENOUS
  Filled 2020-05-20: qty 200

## 2020-05-20 MED ORDER — ACETAMINOPHEN 650 MG RE SUPP
650.0000 mg | Freq: Once | RECTAL | Status: AC
  Administered 2020-05-20: 650 mg via RECTAL

## 2020-05-20 MED ORDER — METOPROLOL TARTRATE 12.5 MG HALF TABLET
12.5000 mg | ORAL_TABLET | Freq: Two times a day (BID) | ORAL | Status: DC
  Administered 2020-05-21 – 2020-05-23 (×4): 12.5 mg via ORAL
  Filled 2020-05-20 (×4): qty 1

## 2020-05-20 MED ORDER — FENTANYL CITRATE (PF) 250 MCG/5ML IJ SOLN
INTRAMUSCULAR | Status: AC
  Filled 2020-05-20: qty 25

## 2020-05-20 MED ORDER — EPHEDRINE 5 MG/ML INJ
INTRAVENOUS | Status: AC
  Filled 2020-05-20: qty 10

## 2020-05-20 MED ORDER — SODIUM CHLORIDE 0.45 % IV SOLN: INTRAVENOUS | Status: DC | PRN

## 2020-05-20 MED ORDER — POTASSIUM CHLORIDE 10 MEQ/50ML IV SOLN: 10.0000 meq | INTRAVENOUS | Status: AC

## 2020-05-20 MED ORDER — ORAL CARE MOUTH RINSE
15.0000 mL | Freq: Two times a day (BID) | OROMUCOSAL | Status: DC
  Administered 2020-05-20 – 2020-05-25 (×4): 15 mL via OROMUCOSAL

## 2020-05-20 MED ORDER — MIDAZOLAM HCL (PF) 10 MG/2ML IJ SOLN
INTRAMUSCULAR | Status: AC
  Filled 2020-05-20: qty 2

## 2020-05-20 MED ORDER — SODIUM CHLORIDE 0.9 % IR SOLN
Status: DC | PRN
  Administered 2020-05-20: 5000 mL

## 2020-05-20 MED ORDER — SODIUM CHLORIDE 0.9 % IV SOLN: INTRAVENOUS | Status: DC | PRN

## 2020-05-20 MED ORDER — MAGNESIUM SULFATE 4 GM/100ML IV SOLN
4.0000 g | Freq: Once | INTRAVENOUS | Status: AC
  Administered 2020-05-20: 4 g via INTRAVENOUS

## 2020-05-20 MED ORDER — HEPARIN SODIUM (PORCINE) 1000 UNIT/ML IJ SOLN
INTRAMUSCULAR | Status: AC
  Filled 2020-05-20: qty 1

## 2020-05-20 MED ORDER — NICARDIPINE HCL IN NACL 20-0.86 MG/200ML-% IV SOLN
INTRAVENOUS | Status: DC | PRN
  Administered 2020-05-20: 5 mg/h via INTRAVENOUS

## 2020-05-20 MED ORDER — PROPOFOL 10 MG/ML IV BOLUS
INTRAVENOUS | Status: AC
  Filled 2020-05-20: qty 20

## 2020-05-20 MED ORDER — PROPOFOL 10 MG/ML IV BOLUS
INTRAVENOUS | Status: DC | PRN
  Administered 2020-05-20: 50 mg via INTRAVENOUS

## 2020-05-20 MED ORDER — ALBUMIN HUMAN 5 % IV SOLN: INTRAVENOUS | Status: DC | PRN

## 2020-05-20 MED ORDER — ASPIRIN 81 MG PO CHEW
324.0000 mg | CHEWABLE_TABLET | Freq: Every day | ORAL | Status: DC
  Filled 2020-05-20: qty 4

## 2020-05-20 MED ORDER — METOPROLOL TARTRATE 5 MG/5ML IV SOLN: 2.5000 mg | INTRAVENOUS | Status: DC | PRN

## 2020-05-20 MED ORDER — FENTANYL CITRATE (PF) 250 MCG/5ML IJ SOLN
INTRAMUSCULAR | Status: DC | PRN
Start: 2020-05-20 — End: 2020-05-20
  Administered 2020-05-20: 150 ug via INTRAVENOUS
  Administered 2020-05-20: 50 ug via INTRAVENOUS
  Administered 2020-05-20: 100 ug via INTRAVENOUS
  Administered 2020-05-20: 500 ug via INTRAVENOUS
  Administered 2020-05-20: 250 ug via INTRAVENOUS
  Administered 2020-05-20: 200 ug via INTRAVENOUS

## 2020-05-20 MED ORDER — SODIUM CHLORIDE 0.9 % IV SOLN: 250.0000 mL | INTRAVENOUS | Status: DC

## 2020-05-20 MED ORDER — MIDAZOLAM HCL 5 MG/5ML IJ SOLN
INTRAMUSCULAR | Status: DC | PRN
  Administered 2020-05-20: 2 mg via INTRAVENOUS
  Administered 2020-05-20: 1 mg via INTRAVENOUS
  Administered 2020-05-20 (×2): 2 mg via INTRAVENOUS
  Administered 2020-05-20: 3 mg via INTRAVENOUS

## 2020-05-20 MED ORDER — METHYLPREDNISOLONE SODIUM SUCC 40 MG IJ SOLR
40.0000 mg | Freq: Once | INTRAMUSCULAR | Status: AC
  Administered 2020-05-20: 40 mg via INTRAVENOUS
  Filled 2020-05-20: qty 1

## 2020-05-20 MED ORDER — SODIUM CHLORIDE (PF) 0.9 % IJ SOLN
OROMUCOSAL | Status: DC | PRN
  Administered 2020-05-20: 8 mL via TOPICAL

## 2020-05-20 MED ORDER — SODIUM CHLORIDE 0.9% FLUSH: 10.0000 mL | INTRAVENOUS | Status: DC | PRN

## 2020-05-20 MED ORDER — MORPHINE SULFATE (PF) 2 MG/ML IV SOLN
1.0000 mg | INTRAVENOUS | Status: DC | PRN
  Administered 2020-05-20 (×2): 1 mg via INTRAVENOUS
  Administered 2020-05-20 – 2020-05-21 (×2): 2 mg via INTRAVENOUS
  Filled 2020-05-20: qty 2
  Filled 2020-05-20: qty 1

## 2020-05-20 MED ORDER — ROCURONIUM BROMIDE 10 MG/ML (PF) SYRINGE
PREFILLED_SYRINGE | INTRAVENOUS | Status: AC
  Filled 2020-05-20: qty 20

## 2020-05-20 MED ORDER — FAMOTIDINE IN NACL 20-0.9 MG/50ML-% IV SOLN
20.0000 mg | Freq: Two times a day (BID) | INTRAVENOUS | Status: AC
  Administered 2020-05-20 (×2): 20 mg via INTRAVENOUS
  Filled 2020-05-20: qty 50

## 2020-05-20 MED FILL — Heparin Sodium (Porcine) Inj 1000 Unit/ML: INTRAMUSCULAR | Qty: 2500 | Status: CN

## 2020-05-20 MED FILL — Heparin Sodium (Porcine) Inj 1000 Unit/ML: INTRAMUSCULAR | Qty: 30 | Status: AC

## 2020-05-20 MED FILL — Potassium Chloride Inj 2 mEq/ML: INTRAVENOUS | Qty: 40 | Status: AC

## 2020-05-20 SURGICAL SUPPLY — 85 items
BAG DECANTER FOR FLEXI CONT (MISCELLANEOUS) ×4 IMPLANT
BLADE CLIPPER SURG (BLADE) ×4 IMPLANT
BLADE STERNUM SYSTEM 6 (BLADE) ×4 IMPLANT
BLADE SURG 11 STRL SS (BLADE) ×2 IMPLANT
BNDG ELASTIC 4X5.8 VLCR STR LF (GAUZE/BANDAGES/DRESSINGS) ×4 IMPLANT
BNDG ELASTIC 6X5.8 VLCR STR LF (GAUZE/BANDAGES/DRESSINGS) ×4 IMPLANT
BNDG GAUZE ELAST 4 BULKY (GAUZE/BANDAGES/DRESSINGS) ×4 IMPLANT
CABLE SURGICAL S-101-97-12 (CABLE) ×4 IMPLANT
CANISTER SUCT 3000ML PPV (MISCELLANEOUS) ×4 IMPLANT
CANNULA AORTIC ROOT 9FR (CANNULA) ×2 IMPLANT
CANNULA MC2 2 STG 29/37 NON-V (CANNULA) ×3 IMPLANT
CANNULA MC2 TWO STAGE (CANNULA) ×4
CANNULA NON VENT 20FR 12 (CANNULA) ×4 IMPLANT
CATH ROBINSON RED A/P 18FR (CATHETERS) ×8 IMPLANT
CLIP RETRACTION 3.0MM CORONARY (MISCELLANEOUS) ×4 IMPLANT
CLIP VESOCCLUDE SM WIDE 24/CT (CLIP) ×6 IMPLANT
CONN ST 1/2X1/2  BEN (MISCELLANEOUS) ×4
CONN ST 1/2X1/2 BEN (MISCELLANEOUS) ×3 IMPLANT
CONNECTOR BLAKE 2:1 CARIO BLK (MISCELLANEOUS) ×4 IMPLANT
DEFOGGER ANTIFOG KIT (MISCELLANEOUS) ×2 IMPLANT
DRAIN CHANNEL 19F RND (DRAIN) ×12 IMPLANT
DRAIN CONNECTOR BLAKE 1:1 (MISCELLANEOUS) ×4 IMPLANT
DRAPE CARDIOVASCULAR INCISE (DRAPES) ×4
DRAPE SLUSH/WARMER DISC (DRAPES) ×4 IMPLANT
DRAPE SRG 135X102X78XABS (DRAPES) ×3 IMPLANT
DRSG COVADERM 4X14 (GAUZE/BANDAGES/DRESSINGS) ×4 IMPLANT
DRSG COVADERM 4X8 (GAUZE/BANDAGES/DRESSINGS) ×2 IMPLANT
ELECT BLADE 4.0 EZ CLEAN MEGAD (MISCELLANEOUS) ×4
ELECT REM PT RETURN 9FT ADLT (ELECTROSURGICAL) ×8
ELECTRODE BLDE 4.0 EZ CLN MEGD (MISCELLANEOUS) ×1 IMPLANT
ELECTRODE REM PT RTRN 9FT ADLT (ELECTROSURGICAL) ×6 IMPLANT
EVACUATOR SILICONE 100CC (DRAIN) ×6 IMPLANT
FELT TEFLON 1X6 (MISCELLANEOUS) ×6 IMPLANT
GAUZE SPONGE 4X4 12PLY STRL (GAUZE/BANDAGES/DRESSINGS) ×6 IMPLANT
GAUZE SPONGE 4X4 12PLY STRL LF (GAUZE/BANDAGES/DRESSINGS) ×2 IMPLANT
GLOVE BIO SURGEON STRL SZ 6 (GLOVE) ×10 IMPLANT
GLOVE BIO SURGEON STRL SZ7 (GLOVE) ×8 IMPLANT
GLOVE BIO SURGEON STRL SZ8 (GLOVE) ×6 IMPLANT
GLOVE BIOGEL PI IND STRL 6 (GLOVE) ×3 IMPLANT
GLOVE BIOGEL PI INDICATOR 6 (GLOVE) ×3
GLOVE SURG SS PI 6.0 STRL IVOR (GLOVE) ×4 IMPLANT
GOWN STRL REUS W/ TWL LRG LVL3 (GOWN DISPOSABLE) ×18 IMPLANT
GOWN STRL REUS W/ TWL XL LVL3 (GOWN DISPOSABLE) ×6 IMPLANT
GOWN STRL REUS W/TWL LRG LVL3 (GOWN DISPOSABLE) ×40
GOWN STRL REUS W/TWL XL LVL3 (GOWN DISPOSABLE) ×8
HEMOSTAT POWDER SURGIFOAM 1G (HEMOSTASIS) ×10 IMPLANT
INSERT FOGARTY 61MM (MISCELLANEOUS) ×2 IMPLANT
INSERT SUTURE HOLDER (MISCELLANEOUS) ×4 IMPLANT
KIT BASIN OR (CUSTOM PROCEDURE TRAY) ×4 IMPLANT
KIT SUCTION CATH 14FR (SUCTIONS) ×4 IMPLANT
KIT TURNOVER KIT B (KITS) ×4 IMPLANT
KIT VASOVIEW HEMOPRO 2 VH 4000 (KITS) ×4 IMPLANT
LEAD PACING MYOCARDI (MISCELLANEOUS) ×4 IMPLANT
MARKER GRAFT CORONARY BYPASS (MISCELLANEOUS) ×8 IMPLANT
NS IRRIG 1000ML POUR BTL (IV SOLUTION) ×20 IMPLANT
PACK ACCESSORY CANNULA KIT (KITS) ×4 IMPLANT
PACK E OPEN HEART (SUTURE) ×4 IMPLANT
PACK OPEN HEART (CUSTOM PROCEDURE TRAY) ×4 IMPLANT
PAD ARMBOARD 7.5X6 YLW CONV (MISCELLANEOUS) ×8 IMPLANT
PAD ELECT DEFIB RADIOL ZOLL (MISCELLANEOUS) ×4 IMPLANT
PENCIL BUTTON HOLSTER BLD 10FT (ELECTRODE) ×4 IMPLANT
POSITIONER HEAD DONUT 9IN (MISCELLANEOUS) ×4 IMPLANT
PUNCH AORTIC ROTATE 4.0MM (MISCELLANEOUS) ×4 IMPLANT
SET CARDIOPLEGIA MPS 5001102 (MISCELLANEOUS) ×2 IMPLANT
SUPPORT HEART JANKE-BARRON (MISCELLANEOUS) ×4 IMPLANT
SUT BONE WAX W31G (SUTURE) ×4 IMPLANT
SUT ETHIBOND X763 2 0 SH 1 (SUTURE) ×8 IMPLANT
SUT MNCRL AB 3-0 PS2 18 (SUTURE) ×8 IMPLANT
SUT MNCRL AB 4-0 PS2 18 (SUTURE) ×2 IMPLANT
SUT PDS AB 1 CTX 36 (SUTURE) ×8 IMPLANT
SUT PROLENE 4 0 RB 1 (SUTURE) ×8
SUT PROLENE 4 0 SH DA (SUTURE) ×8 IMPLANT
SUT PROLENE 4-0 RB1 .5 CRCL 36 (SUTURE) ×2 IMPLANT
SUT PROLENE 5 0 C 1 36 (SUTURE) ×12 IMPLANT
SUT PROLENE 7 0 BV1 MDA (SUTURE) ×4 IMPLANT
SUT STEEL 6MS V (SUTURE) ×8 IMPLANT
SUT VIC AB 2-0 CT1 27 (SUTURE) ×4
SUT VIC AB 2-0 CT1 TAPERPNT 27 (SUTURE) ×1 IMPLANT
SYSTEM SAHARA CHEST DRAIN ATS (WOUND CARE) ×4 IMPLANT
TAPE CLOTH SURG 4X10 WHT LF (GAUZE/BANDAGES/DRESSINGS) ×2 IMPLANT
TOWEL GREEN STERILE FF (TOWEL DISPOSABLE) ×4 IMPLANT
TRAY FOLEY SLVR 16FR TEMP STAT (SET/KITS/TRAYS/PACK) ×4 IMPLANT
TUBING LAP HI FLOW INSUFFLATIO (TUBING) ×4 IMPLANT
UNDERPAD 30X36 HEAVY ABSORB (UNDERPADS AND DIAPERS) ×4 IMPLANT
WATER STERILE IRR 1000ML POUR (IV SOLUTION) ×8 IMPLANT

## 2020-05-20 NOTE — Op Note (Signed)
[address]       [place] [address]             [phone]                                          05/20/2020 Patient:  Donna Sloan Pre-Op Dx: Jerl Santos CAD   NSTEMI   HTN   Atrial Fibrillation   Morbid Obesity      Post-op Dx:  Same Procedure: CABG X 2.  LIMA LAD, RSVG OM2  Endoscopic greater saphenous vein harvest on the Left Intra-operative Transesophageal Echocardiogram  Surgeon and Role:      * Prosperity Darrough, Lucile Crater, MD - Primary    * T. Harriet Pho, PA-C - assisting  Anesthesia  general EBL:  593ml Blood Administration: none Xclamp Time:  36 min Pump Time:  7min  Drains: 33 F blake drain: L, mediastinal  Wires: none Counts: correct   Indications: 79 year old female presented to the emergency department with acute onset chest pain and indigestion.  She was diagnosed with an NSTEMI and taken to the Cath Lab where two-vessel coronary artery disease was discovered.  She currently is chest pain-free.  She does have a history of paroxysmal atrial fibrillation but currently is in sinus rhythm.  Findings: Good conduit, good LIMA.  Very small left atrial appendage.  It measures to  21mm, therefore I elected to forgo placement of the atrial clip.  Calcified LAD.  Good OM.  Hypovolemic coming off pump.  Good function on TEE.  Operative Technique: All invasive lines were placed in pre-op holding.  After the risks, benefits and alternatives were thoroughly discussed, the patient was brought to the operative theatre.  Anesthesia was induced, and the patient was prepped and draped in normal sterile fashion.  An appropriate surgical pause was performed, and pre-operative antibiotics were dosed accordingly.  We began with simultaneous incisions were made along the left leg for harvesting of the greater saphenous vein and the chest for the sternotomy.  In regards to the sternotomy, this was carried down with bovie cautery, and the sternum was divided with a  reciprocating saw.  Meticulous hemostasis was obtained.  The left internal thoracic artery was exposed and harvested in in pedicled fashion.  The patient was systemically heparinized, and the artery was divided distally, and placed in a papaverine sponge.    The sternal elevator was removed, and a retractor was placed.  The pericardium was divided in the midline and fashioned into a cradle with pericardial stitches.   After we confirmed an appropriate ACT, the ascending aorta was cannulated in standard fashion.  The right atrial appendage was used for venous cannulation site.  Cardiopulmonary bypass was initiated, and the heart retractor was placed. The cross clamp was applied, and a dose of anterograde cardioplegia was given with good arrest of the heart.   Next we exposed the lateral wall, and found a good target on the OM2.  An end to side anastomosis with the vein graft was then created.  Finally, we exposed a good target on the LAD, and fashioned an end to side anastomosis between it and the LITA.  We began to re-warm, and a re-animation dose of cardioplegia was given.  The heart was de-aired, and the cross clamp was removed.  Meticulous hemostasis was obtained.    A partial occludding clamp  was then placed on the ascending aorta, and we created an end to side anastomosis between it and the proximal vein graft.  The proximal site was marked with a ring.  Hemostasis was obtained, and we separated from cardiopulmonary bypass without event.the heparin was reversed with protamine.  Chest tubes and wires were placed, and the sternum was re-approximated with with sternal wires.  The soft tissue and skin were re-approximated wth absorbable suture.    The patient tolerated the procedure without any immediate complications, and was transferred to the ICU in guarded condition.  Donna Sloan

## 2020-05-20 NOTE — Progress Notes (Signed)
Pt met all MV parameters. Cuff leak was not audible  Dr. Prescott Gum made aware  Verbal  Order to proceed with extubation 30 minutes after a one time dose of  40 mg IV Solu-medrol with or without an audible cuff leak.

## 2020-05-20 NOTE — Progress Notes (Signed)
RT note. Pt. Has met all MV parameters, VC 0.44, nif -23, ABG within normal limits at this time. Pt. Re measured at 4'8 (10cc= 400). Pt. Unable to extubate at this time due to no audible cuff leak. Pt. Left on PSV/CPAP. RT will continue to monitor, RN is calling MD.

## 2020-05-20 NOTE — Transfer of Care (Signed)
Immediate Anesthesia Transfer of Care Note  Patient: Donna Sloan  Procedure(s) Performed: CORONARY ARTERY BYPASS GRAFTING (CABG) TIMES TWO USING LEFT INTERNAL MAMMARY ARTERY AND RIGHT GREATER SAPHENOUS VEIN HARVESTED ENDOSCOPICALLY (N/A Chest) TRANSESOPHAGEAL ECHOCARDIOGRAM (TEE) (N/A )  Patient Location: ICU  Anesthesia Type:General  Level of Consciousness: sedated and Patient remains intubated per anesthesia plan  Airway & Oxygen Therapy: Patient remains intubated per anesthesia plan and Patient placed on Ventilator (see vital sign flow sheet for setting)  Post-op Assessment: Report given to RN and Post -op Vital signs reviewed and stable  Post vital signs: Reviewed and stable  Last Vitals:  Vitals Value Taken Time  BP 108/51 05/20/20 1200  Temp    Pulse 73 05/20/20 1200  Resp 12 05/20/20 1200  SpO2 100 % 05/20/20 1200    Last Pain:  Vitals:   05/20/20 0350  TempSrc: Oral  PainSc:          Complications: No complications documented.

## 2020-05-20 NOTE — Progress Notes (Signed)
CT surgery p.m. Rounds  Patient stable after multivessel CABG and left atrial clip Mains too sedated to extubate Labs are satisfactory Vent wean later this evening after anesthesia agents wear off further  Blood pressure 110/67, pulse 71, temperature (!) 96.1 F (35.6 C), resp. rate 10, height 5\' 2"  (1.575 m), weight 84.6 kg, SpO2 100 %.

## 2020-05-20 NOTE — Anesthesia Procedure Notes (Signed)
Central Venous Catheter Insertion Performed by: Murvin Natal, MD, anesthesiologist Start/End10/19/2021 6:45 AM, 05/20/2020 7:00 AM Patient location: Pre-op. Preanesthetic checklist: patient identified, IV checked, site marked, risks and benefits discussed, surgical consent, monitors and equipment checked, pre-op evaluation, timeout performed and anesthesia consent Position: Trendelenburg Lidocaine 1% used for infiltration and patient sedated Hand hygiene performed , maximum sterile barriers used  and Seldinger technique used Catheter size: 8.5 Fr Total catheter length 10. Sheath introducer Procedure performed using ultrasound guided technique. Ultrasound Notes:anatomy identified, needle tip was noted to be adjacent to the nerve/plexus identified, no ultrasound evidence of intravascular and/or intraneural injection and image(s) printed for medical record Attempts: 1 Following insertion, line sutured, dressing applied and Biopatch. Post procedure assessment: blood return through all ports, free fluid flow and no air  Patient tolerated the procedure well with no immediate complications.

## 2020-05-20 NOTE — Procedures (Signed)
Extubation Procedure Note  Patient Details:   Name: Cypress Hinkson DOB: 1940/10/17 MRN: [id number]   Airway Documentation:  Airway 7 mm (Active)  Secured at (cm) 22 cm 05/20/20 1948  Measured From Lips 05/20/20 1948  Secured Location Right 05/20/20 1948  Secured By [employer] Tape 05/20/20 1948  Cuff Pressure (cm H2O) 30 cm H2O 05/20/20 1948  Site Condition Dry 05/20/20 1948   Vent end date: (not recorded) Vent end time: (not recorded)   Evaluation  O2 sats: stable throughout Complications: No apparent complications Patient did tolerate procedure well. Bilateral Breath Sounds: Clear, Diminished   Yes  Pt. Extubated to [facility] at this time, no stridor noted, pt. Able to say name, BS bilateral clear/diminished.  Pt. Did not have audible cuff leak prior, per Dr. Prescott Gum remove ETT after steroids. RT will continue to monitor.  Gifford Shave 05/20/2020, 9:46 PM

## 2020-05-20 NOTE — Anesthesia Procedure Notes (Signed)
Arterial Line Insertion Start/End10/19/2021 7:10 AM Performed by: Gaylene Brooks, CRNA, CRNA  Patient location: Pre-op. Preanesthetic checklist: patient identified, IV checked, site marked, risks and benefits discussed, surgical consent, monitors and equipment checked, pre-op evaluation, timeout performed and anesthesia consent Lidocaine 1% used for infiltration and patient sedated Right, radial was placed Catheter size: 20 G Hand hygiene performed  and maximum sterile barriers used   Attempts: 1 Procedure performed without using ultrasound guided technique. Following insertion, dressing applied and Biopatch. Post procedure assessment: normal  Patient tolerated the procedure well with no immediate complications.

## 2020-05-20 NOTE — Progress Notes (Signed)
RT note-Radiologist suggest to withdrawal ETT, ETT pulled back 2cm, BSS equal and good volume return on ventilator.

## 2020-05-20 NOTE — Progress Notes (Signed)
  Echocardiogram Echocardiogram Transesophageal has been performed.  Donna Sloan 05/20/2020, 9:35 AM

## 2020-05-20 NOTE — Anesthesia Postprocedure Evaluation (Signed)
Anesthesia Post Note  Patient: Donna Sloan  Procedure(s) Performed: CORONARY ARTERY BYPASS GRAFTING (CABG) TIMES TWO USING LEFT INTERNAL MAMMARY ARTERY AND RIGHT GREATER SAPHENOUS VEIN HARVESTED ENDOSCOPICALLY (N/A Chest) TRANSESOPHAGEAL ECHOCARDIOGRAM (TEE) (N/A )     Patient location during evaluation: SICU Anesthesia Type: General Level of consciousness: sedated Pain management: pain level controlled Vital Signs Assessment: post-procedure vital signs reviewed and stable Respiratory status: patient remains intubated per anesthesia plan Cardiovascular status: stable Postop Assessment: no apparent nausea or vomiting Anesthetic complications: no   No complications documented.  Last Vitals:  Vitals:   05/20/20 1500 05/20/20 1515  BP: (!) 102/55 99/63  Pulse:    Resp: 16 16  Temp: (!) 36.1 C (!) 36.1 C  SpO2:      Last Pain:  Vitals:   05/20/20 1200  TempSrc: Oral  PainSc:                  Sabreen Kitchen P Evie Croston

## 2020-05-20 NOTE — Progress Notes (Signed)
Pre-extubation ABG:  Ph 7.32 CO2 40.3 O2 104 HCO3 21.3 BeB -5  Patient still sleepy but able to follow commands.   Verbal order from Dr. Prescott Gum to wait 2 hours and redraw ABG.  RN will continue to monitor patient closely.

## 2020-05-20 NOTE — Progress Notes (Signed)
     [address]       [place] [address]             [phone]       No events overnight  Today's Vitals   05/19/20 2055 05/19/20 2200 05/20/20 0300 05/20/20 0350  BP: (!) 154/55   (!) 147/89  Pulse: 68     Resp: 16   20  Temp: 98.2 F (36.8 C)   98.1 F (36.7 C)  TempSrc:    Oral  SpO2: 94%   99%  Weight:   84.6 kg   Height:      PainSc:  0-No pain     Body mass index is 34.13 kg/m.  Alert NAD Sinus EWOB  Labs reviewed  OR today for CABG [place]

## 2020-05-20 NOTE — Brief Op Note (Signed)
05/14/2020 - 05/20/2020  12:44 PM  PATIENT:  Donna Sloan  79 y.o. female  PRE-OPERATIVE DIAGNOSIS:  CAD  POST-OPERATIVE DIAGNOSIS:  CAD  PROCEDURE:  Procedure(s): CORONARY ARTERY BYPASS GRAFTING (CABG) TIMES TWO USING LEFT INTERNAL MAMMARY ARTERY AND RIGHT GREATER SAPHENOUS VEIN HARVESTED ENDOSCOPICALLY (N/A) TRANSESOPHAGEAL ECHOCARDIOGRAM (TEE) (N/A)   SV time: 20 minutes, 5 minutes prep  SVG to OM1 LIMA to LAD  SURGEON:  Surgeon(s) and Role:    * Lightfoot, Lucile Crater, MD - Primary  PHYSICIAN ASSISTANT:  Nicholes Rough, PA-C   ANESTHESIA:   general  EBL:  446 mL   BLOOD ADMINISTERED:none  DRAINS:  ROUTINE    LOCAL MEDICATIONS USED:  NONE  SPECIMEN:  No Specimen  DISPOSITION OF SPECIMEN:  n/a  COUNTS:  YES  DICTATION: .Dragon Dictation  PLAN OF CARE: Admit to inpatient   PATIENT DISPOSITION:  ICU - intubated and hemodynamically stable.   Delay start of Pharmacological VTE agent (>24hrs) due to surgical blood loss or risk of bleeding: yes

## 2020-05-20 NOTE — Anesthesia Procedure Notes (Signed)
Procedure Name: Intubation Date/Time: 05/20/2020 7:54 AM Performed by: Trinna Post., CRNA Pre-anesthesia Checklist: Patient identified, Emergency Drugs available, Suction available, Timeout performed and Patient being monitored Patient Re-evaluated:Patient Re-evaluated prior to induction Oxygen Delivery Method: Circle system utilized Preoxygenation: Pre-oxygenation with 100% oxygen Induction Type: IV induction Ventilation: Mask ventilation without difficulty and Oral airway inserted - appropriate to patient size Laryngoscope Size: Mac and 3 Grade View: Grade I Tube type: Oral Tube size: 7.0 mm Number of attempts: 1 Airway Equipment and Method: Stylet Placement Confirmation: ETT inserted through vocal cords under direct vision,  positive ETCO2 and breath sounds checked- equal and bilateral Secured at: 23 cm Tube secured with: Tape Dental Injury: Teeth and Oropharynx as per pre-operative assessment

## 2020-05-21 ENCOUNTER — Encounter (HOSPITAL_COMMUNITY): Payer: Self-pay | Admitting: Thoracic Surgery (Cardiothoracic Vascular Surgery)

## 2020-05-21 ENCOUNTER — Inpatient Hospital Stay (HOSPITAL_COMMUNITY): Payer: Medicare Other

## 2020-05-21 LAB — CBC
HCT: 30.5 % — ABNORMAL LOW (ref 36.0–46.0)
HCT: 29.2 % — ABNORMAL LOW (ref 36.0–46.0)
Hemoglobin: 9.8 g/dL — ABNORMAL LOW (ref 12.0–15.0)
Hemoglobin: 9 g/dL — ABNORMAL LOW (ref 12.0–15.0)
MCH: 30.2 pg (ref 26.0–34.0)
MCH: 29.5 pg (ref 26.0–34.0)
MCHC: 32.1 g/dL (ref 30.0–36.0)
MCHC: 30.8 g/dL (ref 30.0–36.0)
MCV: 93.8 fL (ref 80.0–100.0)
MCV: 95.7 fL (ref 80.0–100.0)
Platelets: 95 10*3/uL — ABNORMAL LOW (ref 150–400)
Platelets: 104 10*3/uL — ABNORMAL LOW (ref 150–400)
RBC: 3.25 MIL/uL — ABNORMAL LOW (ref 3.87–5.11)
RBC: 3.05 MIL/uL — ABNORMAL LOW (ref 3.87–5.11)
RDW: 13 % (ref 11.5–15.5)
RDW: 13.2 % (ref 11.5–15.5)
WBC: 8.8 10*3/uL (ref 4.0–10.5)
WBC: 12.4 10*3/uL — ABNORMAL HIGH (ref 4.0–10.5)
nRBC: 0 % (ref 0.0–0.2)
nRBC: 0 % (ref 0.0–0.2)

## 2020-05-21 LAB — BASIC METABOLIC PANEL
Anion gap: 8 (ref 5–15)
Anion gap: 7 (ref 5–15)
BUN: 15 mg/dL (ref 8–23)
BUN: 17 mg/dL (ref 8–23)
CO2: 22 mmol/L (ref 22–32)
CO2: 24 mmol/L (ref 22–32)
Calcium: 9 mg/dL (ref 8.9–10.3)
Calcium: 9 mg/dL (ref 8.9–10.3)
Chloride: 108 mmol/L (ref 98–111)
Chloride: 106 mmol/L (ref 98–111)
Creatinine, Ser: 1.04 mg/dL — ABNORMAL HIGH (ref 0.44–1.00)
Creatinine, Ser: 1.09 mg/dL — ABNORMAL HIGH (ref 0.44–1.00)
GFR, Estimated: 51 mL/min — ABNORMAL LOW (ref >60)
GFR, Estimated: 49 mL/min — ABNORMAL LOW (ref >60)
Glucose, Bld: 143 mg/dL — ABNORMAL HIGH (ref 70–99)
Glucose, Bld: 110 mg/dL — ABNORMAL HIGH (ref 70–99)
Potassium: 4.8 mmol/L (ref 3.5–5.1)
Potassium: 4.5 mmol/L (ref 3.5–5.1)
Sodium: 138 mmol/L (ref 135–145)
Sodium: 137 mmol/L (ref 135–145)

## 2020-05-21 LAB — HEMOGLOBIN A1C
Hgb A1c MFr Bld: 5.7 % — ABNORMAL HIGH (ref 4.8–5.6)
Mean Plasma Glucose: 117 mg/dL

## 2020-05-21 LAB — GLUCOSE, CAPILLARY
Glucose-Capillary: 118 mg/dL — ABNORMAL HIGH (ref 70–99)
Glucose-Capillary: 126 mg/dL — ABNORMAL HIGH (ref 70–99)
Glucose-Capillary: 127 mg/dL — ABNORMAL HIGH (ref 70–99)
Glucose-Capillary: 128 mg/dL — ABNORMAL HIGH (ref 70–99)
Glucose-Capillary: 127 mg/dL — ABNORMAL HIGH (ref 70–99)
Glucose-Capillary: 131 mg/dL — ABNORMAL HIGH (ref 70–99)
Glucose-Capillary: 107 mg/dL — ABNORMAL HIGH (ref 70–99)
Glucose-Capillary: 109 mg/dL — ABNORMAL HIGH (ref 70–99)
Glucose-Capillary: 101 mg/dL — ABNORMAL HIGH (ref 70–99)
Glucose-Capillary: 118 mg/dL — ABNORMAL HIGH (ref 70–99)

## 2020-05-21 LAB — MAGNESIUM
Magnesium: 2.6 mg/dL — ABNORMAL HIGH (ref 1.7–2.4)
Magnesium: 2.3 mg/dL (ref 1.7–2.4)

## 2020-05-21 MED ORDER — INSULIN ASPART 100 UNIT/ML ~~LOC~~ SOLN: 0.0000 [IU] | SUBCUTANEOUS | Status: DC

## 2020-05-21 MED ORDER — ENOXAPARIN SODIUM 30 MG/0.3ML ~~LOC~~ SOLN: 30.0000 mg | Freq: Every day | SUBCUTANEOUS | Status: DC

## 2020-05-21 MED FILL — Heparin Sodium (Porcine) Inj 1000 Unit/ML: INTRAMUSCULAR | Qty: 10 | Status: AC

## 2020-05-21 MED FILL — Albumin, Human Inj 5%: INTRAVENOUS | Qty: 250 | Status: AC

## 2020-05-21 MED FILL — Sodium Bicarbonate IV Soln 8.4%: INTRAVENOUS | Qty: 50 | Status: AC

## 2020-05-21 MED FILL — Sodium Chloride IV Soln 0.9%: INTRAVENOUS | Qty: 3000 | Status: AC

## 2020-05-21 MED FILL — Electrolyte-R (PH 7.4) Solution: INTRAVENOUS | Qty: 4000 | Status: AC

## 2020-05-21 MED FILL — Calcium Chloride Inj 10%: INTRAVENOUS | Qty: 10 | Status: AC

## 2020-05-21 NOTE — Progress Notes (Signed)
Per Lightfoot MD, hold lovenox d/t low platelet count. Also okay to give 500 mL albumin to try to wean neo off and get arterial line out.

## 2020-05-21 NOTE — Progress Notes (Signed)
TCTS BRIEF SICU PROGRESS NOTE  1 Day Post-Op  S/P Procedure(s) (LRB): CORONARY ARTERY BYPASS GRAFTING (CABG) TIMES TWO USING LEFT INTERNAL MAMMARY ARTERY AND RIGHT GREATER SAPHENOUS VEIN HARVESTED ENDOSCOPICALLY (N/A) TRANSESOPHAGEAL ECHOCARDIOGRAM (TEE) (N/A)   Stable day NSR w/ stable BP Breathing comfortably on nasal cannula UOP adequate Labs okay  Plan: Continue current plan  Rexene Alberts, MD 05/21/2020 5:56 PM

## 2020-05-21 NOTE — Progress Notes (Signed)
      [address]       [place] [address]             [phone]                 1 Day Post-Op Procedure(s) (LRB): CORONARY ARTERY BYPASS GRAFTING (CABG) TIMES TWO USING LEFT INTERNAL MAMMARY ARTERY AND RIGHT GREATER SAPHENOUS VEIN HARVESTED ENDOSCOPICALLY (N/A) TRANSESOPHAGEAL ECHOCARDIOGRAM (TEE) (N/A)   Events: No events _______________________________________________________________ Vitals: BP (!) 103/59   Pulse 65   Temp 97.8 F (36.6 C) (Oral)   Resp 19   Ht 4\' 10"  (1.473 m)   Wt 90.6 kg   SpO2 100%   BMI 41.75 kg/m   - Neuro: alert NAD  - Cardiovascular: sinus  Drips: non.   CVP:  [3 mmHg-22 mmHg] 13 mmHg  - Pulm: EWOB  ABG    Component Value Date/Time   PHART 7.352 05/20/2020 2329   PCO2ART 36.8 05/20/2020 2329   PO2ART 108 05/20/2020 2329   HCO3 20.4 05/20/2020 2329   TCO2 21 (L) 05/20/2020 2329   ACIDBASEDEF 5.0 (H) 05/20/2020 2329   O2SAT 98.0 05/20/2020 2329    - Abd: soft - Extremity: warm  .Intake/Output      10/19 0701 - 10/20 0700 10/20 0701 - 10/21 0700   P.O. 168    I.V. (mL/kg) 3040.6 (33.6)    Blood 260    IV Piggyback 1696.3    Total Intake(mL/kg) 5164.9 (57)    Urine (mL/kg/hr) 3115 (1.4) 70 (0.2)   Blood 446    Chest Tube 450 50   Total Output 4011 120   Net +1153.9 -120           _______________________________________________________________ Labs: CBC Latest Ref Rng & Units 05/21/2020 05/20/2020 05/20/2020  WBC 4.0 - 10.5 K/uL 8.8 - -  Hemoglobin 12.0 - 15.0 g/dL 9.8(L) 8.8(L) 8.8(L)  Hematocrit 36 - 46 % 30.5(L) 26.0(L) 26.0(L)  Platelets 150 - 400 K/uL 95(L) - -   CMP Latest Ref Rng & Units 05/21/2020 05/20/2020 05/20/2020  Glucose 70 - 99 mg/dL 143(H) - -  BUN 8 - 23 mg/dL 15 - -  Creatinine 0.44 - 1.00 mg/dL 1.04(H) - -  Sodium 135 - 145 mmol/L 138 143 142  Potassium 3.5 - 5.1 mmol/L 4.8 3.7 4.0  Chloride 98 - 111 mmol/L 108 - -  CO2 22 - 32 mmol/L 22 - -  Calcium 8.9 - 10.3 mg/dL 9.0 -  -  Total Protein 6.5 - 8.1 g/dL - - -  Total Bilirubin 0.3 - 1.2 mg/dL - - -  Alkaline Phos 38 - 126 U/L - - -  AST 15 - 41 U/L - - -  ALT 0 - 44 U/L - - -    CXR: clear  _______________________________________________________________  Assessment and Plan: POD 1 s/p CABG  Neuro: pain controlled CV: a/s/bb.  Will remove lines Pulm: continue pulm toilet Renal: stable GI: advancing diet Heme: stable ID: [email or web address] Endo: SSI Dispo: continue ICU  Melodie Bouillon, MD 05/21/2020 10:34 AM

## 2020-05-22 ENCOUNTER — Inpatient Hospital Stay (HOSPITAL_COMMUNITY): Payer: Medicare Other

## 2020-05-22 LAB — BASIC METABOLIC PANEL
Anion gap: 7 (ref 5–15)
BUN: 21 mg/dL (ref 8–23)
CO2: 23 mmol/L (ref 22–32)
Calcium: 9.3 mg/dL (ref 8.9–10.3)
Chloride: 106 mmol/L (ref 98–111)
Creatinine, Ser: 1.26 mg/dL — ABNORMAL HIGH (ref 0.44–1.00)
GFR, Estimated: 41 mL/min — ABNORMAL LOW (ref >60)
Glucose, Bld: 114 mg/dL — ABNORMAL HIGH (ref 70–99)
Potassium: 4.6 mmol/L (ref 3.5–5.1)
Sodium: 136 mmol/L (ref 135–145)

## 2020-05-22 LAB — CBC
HCT: 28.7 % — ABNORMAL LOW (ref 36.0–46.0)
Hemoglobin: 8.9 g/dL — ABNORMAL LOW (ref 12.0–15.0)
MCH: 29.9 pg (ref 26.0–34.0)
MCHC: 31 g/dL (ref 30.0–36.0)
MCV: 96.3 fL (ref 80.0–100.0)
Platelets: 104 10*3/uL — ABNORMAL LOW (ref 150–400)
RBC: 2.98 MIL/uL — ABNORMAL LOW (ref 3.87–5.11)
RDW: 13.4 % (ref 11.5–15.5)
WBC: 11.5 10*3/uL — ABNORMAL HIGH (ref 4.0–10.5)
nRBC: 0 % (ref 0.0–0.2)

## 2020-05-22 LAB — GLUCOSE, CAPILLARY
Glucose-Capillary: 100 mg/dL — ABNORMAL HIGH (ref 70–99)
Glucose-Capillary: 101 mg/dL — ABNORMAL HIGH (ref 70–99)
Glucose-Capillary: 102 mg/dL — ABNORMAL HIGH (ref 70–99)
Glucose-Capillary: 108 mg/dL — ABNORMAL HIGH (ref 70–99)
Glucose-Capillary: 108 mg/dL — ABNORMAL HIGH (ref 70–99)
Glucose-Capillary: 110 mg/dL — ABNORMAL HIGH (ref 70–99)

## 2020-05-22 MED ORDER — ~~LOC~~ CARDIAC SURGERY, PATIENT & FAMILY EDUCATION: Freq: Once | Status: AC

## 2020-05-22 MED ORDER — SODIUM CHLORIDE 0.9 % IV SOLN: 250.0000 mL | INTRAVENOUS | Status: DC | PRN

## 2020-05-22 MED ORDER — IPRATROPIUM-ALBUTEROL 0.5-2.5 (3) MG/3ML IN SOLN: 3.0000 mL | RESPIRATORY_TRACT | Status: DC | PRN

## 2020-05-22 MED ORDER — SODIUM CHLORIDE 0.9% FLUSH
3.0000 mL | Freq: Two times a day (BID) | INTRAVENOUS | Status: DC
  Administered 2020-05-22 – 2020-05-25 (×5): 3 mL via INTRAVENOUS

## 2020-05-22 MED ORDER — SODIUM CHLORIDE 0.9% FLUSH: 3.0000 mL | INTRAVENOUS | Status: DC | PRN

## 2020-05-22 MED ORDER — FUROSEMIDE 40 MG PO TABS
40.0000 mg | ORAL_TABLET | Freq: Every day | ORAL | Status: DC
  Administered 2020-05-22 – 2020-05-25 (×4): 40 mg via ORAL
  Filled 2020-05-22 (×5): qty 1

## 2020-05-22 MED ORDER — POTASSIUM CHLORIDE CRYS ER 20 MEQ PO TBCR
40.0000 meq | EXTENDED_RELEASE_TABLET | Freq: Every day | ORAL | Status: DC
  Administered 2020-05-22: 40 meq via ORAL
  Filled 2020-05-22 (×2): qty 2

## 2020-05-22 MED ORDER — INSULIN ASPART 100 UNIT/ML ~~LOC~~ SOLN
0.0000 [IU] | Freq: Three times a day (TID) | SUBCUTANEOUS | Status: DC
  Administered 2020-05-23: 2 [IU] via SUBCUTANEOUS

## 2020-05-22 NOTE — Progress Notes (Signed)
CARDIAC REHAB PHASE I   Returned to offer to walk with pt, pt states she is unable to walk. Still feels nauseated and weak. Pt helped to BR. BR call light within reach. RN aware. Will continue to follow.  [id number] Rufina Falco, RN BSN 05/22/2020 2:19 PM

## 2020-05-22 NOTE — Progress Notes (Signed)
      [address]       [place], [address]             [phone]                 2 Days Post-Op Procedure(s) (LRB): CORONARY ARTERY BYPASS GRAFTING (CABG) TIMES TWO USING LEFT INTERNAL MAMMARY ARTERY AND RIGHT GREATER SAPHENOUS VEIN HARVESTED ENDOSCOPICALLY (N/A) TRANSESOPHAGEAL ECHOCARDIOGRAM (TEE) (N/A)   Events: No events _______________________________________________________________ Vitals: BP 115/63   Pulse 83   Temp 98.3 F (36.8 C) (Oral)   Resp 14   Ht 4\' 10"  (1.473 m)   Wt 91.2 kg   SpO2 91%   BMI 42.02 kg/m   - Neuro: alert NAD  - Cardiovascular: sinus  Drips: non.      - Pulm: EWOB  ABG    Component Value Date/Time   PHART 7.352 05/20/2020 2329   PCO2ART 36.8 05/20/2020 2329   PO2ART 108 05/20/2020 2329   HCO3 20.4 05/20/2020 2329   TCO2 21 (L) 05/20/2020 2329   ACIDBASEDEF 5.0 (H) 05/20/2020 2329   O2SAT 98.0 05/20/2020 2329    - Abd: soft - Extremity: warm  .Intake/Output      10/21 0701 - 10/22 0700   P.O. 120   I.V. (mL/kg) 174.5 (1.9)   IV Piggyback    Total Intake(mL/kg) 294.5 (3.2)   Urine (mL/kg/hr) 0 (0)   Stool 0   Chest Tube 130   Total Output 130   Net +164.5       Urine Occurrence 4 x   Stool Occurrence 2 x      _______________________________________________________________ Labs: CBC Latest Ref Rng & Units 05/22/2020 05/21/2020 05/21/2020  WBC 4.0 - 10.5 K/uL 11.5(H) 12.4(H) 8.8  Hemoglobin 12.0 - 15.0 g/dL 8.9(L) 9.0(L) 9.8(L)  Hematocrit 36 - 46 % 28.7(L) 29.2(L) 30.5(L)  Platelets 150 - 400 K/uL 104(L) 104(L) 95(L)   CMP Latest Ref Rng & Units 05/22/2020 05/21/2020 05/21/2020  Glucose 70 - 99 mg/dL 114(H) 110(H) 143(H)  BUN 8 - 23 mg/dL 21 17 15   Creatinine 0.44 - 1.00 mg/dL 1.26(H) 1.09(H) 1.04(H)  Sodium 135 - 145 mmol/L 136 137 138  Potassium 3.5 - 5.1 mmol/L 4.6 4.5 4.8  Chloride 98 - 111 mmol/L 106 106 108  CO2 22 - 32 mmol/L 23 24 22   Calcium 8.9 - 10.3 mg/dL 9.3 9.0 9.0  Total  Protein 6.5 - 8.1 g/dL - - -  Total Bilirubin 0.3 - 1.2 mg/dL - - -  Alkaline Phos 38 - 126 U/L - - -  AST 15 - 41 U/L - - -  ALT 0 - 44 U/L - - -    CXR: clear  _______________________________________________________________  Assessment and Plan: POD 2 s/p CABG  Neuro: pain controlled CV: a/s/bb.   Pulm: continue pulm toilet.  Will remove drains Renal: stable GI: on diet Heme: stable ID: [email or web address] Endo: SSI Dispo:floor today  Melodie Bouillon, MD 05/22/2020 7:26 PM

## 2020-05-22 NOTE — Progress Notes (Signed)
Scanned and administered 10:00 meds along with prn IV Zofran after pt reported nausea. Pt had an episode of emesis. Pills present in vomitus. Will continue to monitor.

## 2020-05-22 NOTE — Progress Notes (Signed)
CARDIAC REHAB PHASE I   Offered to walk with pt, pt hesitant, but agreeable. During morning medicine, pt became nauseated and had 2 emesis episodes. RN aware. Pt provided with ginger ale and saltines. Encouraged pt to eat something, and get some rest. Will return to ambulate as able.  [phone] Rufina Falco, RN BSN 05/22/2020 10:39 AM

## 2020-05-23 ENCOUNTER — Telehealth: Payer: Self-pay | Admitting: Cardiovascular Disease

## 2020-05-23 DIAGNOSIS — Z0279 Encounter for issue of other medical certificate: Secondary | ICD-10-CM

## 2020-05-23 DIAGNOSIS — E785 Hyperlipidemia, unspecified: Secondary | ICD-10-CM | POA: Diagnosis not present

## 2020-05-23 DIAGNOSIS — I10 Essential (primary) hypertension: Secondary | ICD-10-CM | POA: Diagnosis not present

## 2020-05-23 DIAGNOSIS — I214 Non-ST elevation (NSTEMI) myocardial infarction: Secondary | ICD-10-CM | POA: Diagnosis not present

## 2020-05-23 LAB — BPAM RBC
Blood Product Expiration Date: 202111042359
Blood Product Expiration Date: 202111042359
ISSUE DATE / TIME: 202110181007
ISSUE DATE / TIME: 202110181013
Unit Type and Rh: 6200
Unit Type and Rh: 6200

## 2020-05-23 LAB — TYPE AND SCREEN
ABO/RH(D): A POS
Antibody Screen: NEGATIVE
Unit division: 0
Unit division: 0

## 2020-05-23 LAB — BASIC METABOLIC PANEL
Anion gap: 6 (ref 5–15)
BUN: 20 mg/dL (ref 8–23)
CO2: 24 mmol/L (ref 22–32)
Calcium: 9.7 mg/dL (ref 8.9–10.3)
Chloride: 108 mmol/L (ref 98–111)
Creatinine, Ser: 1.18 mg/dL — ABNORMAL HIGH (ref 0.44–1.00)
GFR, Estimated: 47 mL/min — ABNORMAL LOW (ref >60)
Glucose, Bld: 112 mg/dL — ABNORMAL HIGH (ref 70–99)
Potassium: 5 mmol/L (ref 3.5–5.1)
Sodium: 138 mmol/L (ref 135–145)

## 2020-05-23 LAB — CBC
HCT: 31.5 % — ABNORMAL LOW (ref 36.0–46.0)
Hemoglobin: 10 g/dL — ABNORMAL LOW (ref 12.0–15.0)
MCH: 29.9 pg (ref 26.0–34.0)
MCHC: 31.7 g/dL (ref 30.0–36.0)
MCV: 94.3 fL (ref 80.0–100.0)
Platelets: 141 10*3/uL — ABNORMAL LOW (ref 150–400)
RBC: 3.34 MIL/uL — ABNORMAL LOW (ref 3.87–5.11)
RDW: 13.4 % (ref 11.5–15.5)
WBC: 12.1 10*3/uL — ABNORMAL HIGH (ref 4.0–10.5)
nRBC: 0 % (ref 0.0–0.2)

## 2020-05-23 LAB — GLUCOSE, CAPILLARY
Glucose-Capillary: 101 mg/dL — ABNORMAL HIGH (ref 70–99)
Glucose-Capillary: 137 mg/dL — ABNORMAL HIGH (ref 70–99)
Glucose-Capillary: 105 mg/dL — ABNORMAL HIGH (ref 70–99)
Glucose-Capillary: 108 mg/dL — ABNORMAL HIGH (ref 70–99)

## 2020-05-23 MED ORDER — AMIODARONE HCL 200 MG PO TABS: 200.0000 mg | ORAL_TABLET | Freq: Two times a day (BID) | ORAL | Status: DC

## 2020-05-23 MED ORDER — AMIODARONE HCL 200 MG PO TABS
400.0000 mg | ORAL_TABLET | Freq: Two times a day (BID) | ORAL | Status: DC
  Administered 2020-05-23 – 2020-05-25 (×4): 400 mg via ORAL
  Filled 2020-05-23 (×4): qty 2

## 2020-05-23 MED ORDER — METOPROLOL TARTRATE 25 MG/10 ML ORAL SUSPENSION
12.5000 mg | Freq: Two times a day (BID) | ORAL | Status: DC
  Filled 2020-05-23 (×5): qty 5

## 2020-05-23 MED ORDER — METOPROLOL TARTRATE 25 MG PO TABS
25.0000 mg | ORAL_TABLET | Freq: Two times a day (BID) | ORAL | Status: DC
  Administered 2020-05-23 – 2020-05-25 (×4): 25 mg via ORAL
  Filled 2020-05-23 (×4): qty 1

## 2020-05-23 NOTE — Hospital Course (Addendum)
HPI:   79 year old female presented to the emergency department with acute onset chest pain and indigestion.  She was diagnosed with an NSTEMI and taken to the Cath Lab where two-vessel coronary artery disease was discovered.  She currently is chest pain-free.  She does have a history of paroxysmal atrial fibrillation but currently is in sinus rhythm.   She lives at home with her husband and her son, and is the main caregiver for her husband who is nonambulatory.  Hospital Course:  As dictated by Donna Rough PA-C: Donna Sloan underwent a CABG x 2 by Dr. Kipp Brood on 05/20/2020. She tolerated the procedure well and was transferred to the cardiac stepdown unit in stable condition. POD 1 her pain was well controlled. We removed her lines. She continued to progress and started ambulating in the unit. POD 2 we removed her chest drains. She was stable to transfer to the floor t10/22 for continued care.   Addendum: She was volume overloaded and diuresed accordingly. She maintained sinus rhythm on oral Amiodarone and Lopressor. She has been ambulating on room air. She has been tolerating a diet and has had a bowel movement. Her wounds are clean, dry, and healing without signs of infection. Chest tube sutures will be removed in the office after discharge. As discussed with Dr. Cyndia Bent, she is surgically stable for discharge today.

## 2020-05-23 NOTE — Discharge Instructions (Signed)

## 2020-05-23 NOTE — Progress Notes (Signed)
Mobility Specialist: Progress Note   05/23/20 1826  Mobility  Activity Ambulated in hall  Level of [place] wheel walker  Distance Ambulated (ft) 180 ft  Mobility Response Tolerated well  Mobility performed by Mobility specialist  Bed Position Chair  $Mobility charge 1 Mobility   Pre-Mobility: 90 HR, 95/53 BP, 94% SpO2 Post-Mobility: 131 HR, 97/48 BP, 96% SpO2  Pt had no c/o during ambulation. Pt's HR did spike to 131 upon returning to room from walk, RN notified.   [facility] Yocelin Vanlue Mobility Specialist

## 2020-05-23 NOTE — Progress Notes (Signed)
      [address]       [place] [address]             [phone]                 3 Days Post-Op Procedure(s) (LRB): CORONARY ARTERY BYPASS GRAFTING (CABG) TIMES TWO USING LEFT INTERNAL MAMMARY ARTERY AND RIGHT GREATER SAPHENOUS VEIN HARVESTED ENDOSCOPICALLY (N/A) TRANSESOPHAGEAL ECHOCARDIOGRAM (TEE) (N/A)   Events: No events _______________________________________________________________ Vitals: BP 122/61 (BP Location: Left Arm)   Pulse 83   Temp 98.6 F (37 C) (Oral)   Resp 19   Ht 5\' 1"  (1.549 m)   Wt 88 kg Comment: stated per pt  SpO2 92%   BMI 36.66 kg/m   - Neuro: alert NAD  - Cardiovascular: sinus  Drips: non.      - Pulm: EWOB  ABG    Component Value Date/Time   PHART 7.352 05/20/2020 2329   PCO2ART 36.8 05/20/2020 2329   PO2ART 108 05/20/2020 2329   HCO3 20.4 05/20/2020 2329   TCO2 21 (L) 05/20/2020 2329   ACIDBASEDEF 5.0 (H) 05/20/2020 2329   O2SAT 98.0 05/20/2020 2329    - Abd: soft - Extremity: warm  .Intake/Output      10/21 0701 - 10/22 0700 10/22 0701 - 10/23 0700   P.O. 240    I.V. (mL/kg) 174.5 (2)    IV Piggyback     Total Intake(mL/kg) 414.5 (4.7)    Urine (mL/kg/hr) 0 (0)    Stool 0    Chest Tube 130    Total Output 130    Net +284.5         Urine Occurrence 7 x 1 x   Stool Occurrence 6 x 2 x      _______________________________________________________________ Labs: CBC Latest Ref Rng & Units 05/23/2020 05/22/2020 05/21/2020  WBC 4.0 - 10.5 K/uL 12.1(H) 11.5(H) 12.4(H)  Hemoglobin 12.0 - 15.0 g/dL 10.0(L) 8.9(L) 9.0(L)  Hematocrit 36 - 46 % 31.5(L) 28.7(L) 29.2(L)  Platelets 150 - 400 K/uL 141(L) 104(L) 104(L)   CMP Latest Ref Rng & Units 05/23/2020 05/22/2020 05/21/2020  Glucose 70 - 99 mg/dL 112(H) 114(H) 110(H)  BUN 8 - 23 mg/dL 20 21 17   Creatinine 0.44 - 1.00 mg/dL 1.18(H) 1.26(H) 1.09(H)  Sodium 135 - 145 mmol/L 138 136 137  Potassium 3.5 - 5.1 mmol/L 5.0 4.6 4.5  Chloride 98 - 111 mmol/L  108 106 106  CO2 22 - 32 mmol/L 24 23 24   Calcium 8.9 - 10.3 mg/dL 9.7 9.3 9.0  Total Protein 6.5 - 8.1 g/dL - - -  Total Bilirubin 0.3 - 1.2 mg/dL - - -  Alkaline Phos 38 - 126 U/L - - -  AST 15 - 41 U/L - - -  ALT 0 - 44 U/L - - -    CXR: clear  _______________________________________________________________  Assessment and Plan: POD 3 s/p CABG  Neuro: pain controlled CV: a/s/bb.  On amio for short run of afib.  Hx of afib in 2014 Pulm: continue pulm toilet.   Renal: stable GI: on diet Heme: stable ID: [email or web address] Endo: SSI Dispo:home this weekend  Melodie Bouillon, MD 05/23/2020 4:38 PM

## 2020-05-23 NOTE — Progress Notes (Addendum)
Progress Note  Patient Name: Donna Sloan Date of Encounter: 05/23/2020  Primary Cardiologist: Quay Burow, MD  Subjective   Denies SOB  No CP  She denies palpitaitons   Inpatient Medications    Scheduled Meds:  acetaminophen  1,000 mg Oral Q6H   Or   acetaminophen (TYLENOL) oral liquid 160 mg/5 mL  1,000 mg Per Tube Q6H   aspirin EC  325 mg Oral Daily   Or   aspirin  324 mg Per Tube Daily   atorvastatin  80 mg Oral Daily   bisacodyl  10 mg Oral Daily   Or   bisacodyl  10 mg Rectal Daily   Chlorhexidine Gluconate Cloth  6 each Topical Daily   docusate sodium  200 mg Oral Daily   furosemide  40 mg Oral Daily   insulin aspart  0-24 Units Subcutaneous TID WC & HS   mouth rinse  15 mL Mouth Rinse BID   metoprolol tartrate  12.5 mg Oral BID   Or   metoprolol tartrate  12.5 mg Per Tube BID   pantoprazole  40 mg Oral Daily   sodium chloride flush  3 mL Intravenous Q12H   sodium chloride flush  3 mL Intravenous Q12H   Continuous Infusions:  sodium chloride     sodium chloride     sodium chloride     lactated ringers     lactated ringers Stopped (05/22/20 1247)   PRN Meds: sodium chloride, ipratropium-albuterol, metoprolol tartrate, morphine injection, ondansetron (ZOFRAN) IV, oxyCODONE, sodium chloride flush, sodium chloride flush, traMADol   Vital Signs    Vitals:   05/23/20 1110 05/23/20 1147 05/23/20 1200 05/23/20 1333  BP: (!) 137/59  117/61 116/75  Pulse: (!) 114  76 79  Resp:   17 18  Temp:  99.6 F (37.6 C)  98.4 F (36.9 C)  TempSrc:  Oral  Oral  SpO2:   96% 95%  Weight:    88 kg  Height:    5\' 1"  (1.549 m)    Intake/Output Summary (Last 24 hours) at 05/23/2020 1513 Last data filed at 05/23/2020 0500 Gross per 24 hour  Intake 240 ml  Output --  Net 240 ml   Last 3 Weights 05/23/2020 05/23/2020 05/22/2020  Weight (lbs) 194 lb 195 lb 5.2 oz 201 lb 1 oz  Weight (kg) 87.998 kg 88.6 kg 91.2 kg     Telemetry    SR and narrow complex tach   140 max (transient, only when up walking) Personally Reviewed  Physical Exam   GEN: No acute distress.  HEENT: Normocephalic, atraumatic, sclera non-icteric. Neck: No JVD or bruits. Cardiac: RRR no murmurs, rubs Radials/DP/PT 1+ and equal bilaterally.  Respiratory:Mild rhonchi  . GI: Soft, nontender, non-distended, BS +x 4. Extremities: Triv LE edema  Labs    High Sensitivity Troponin:   Recent Labs  Lab 05/14/20 1308 05/14/20 1545 05/14/20 2113  TROPONINIHS 259* 932* 4,696*      Cardiac EnzymesNo results for input(s): TROPONINI in the last 168 hours. No results for input(s): TROPIPOC in the last 168 hours.   Chemistry Recent Labs  Lab 05/20/20 0141 05/20/20 0801 05/21/20 1544 05/22/20 0418 05/23/20 0034  NA 140   < > 137 136 138  K 3.7   < > 4.5 4.6 5.0  CL 107   < > 106 106 108  CO2 23   < > 24 23 24   GLUCOSE 125*   < > 110* 114* 112*  BUN 22   < >  17 21 20   CREATININE 1.19*   < > 1.09* 1.26* 1.18*  CALCIUM 9.5   < > 9.0 9.3 9.7  PROT 6.2*  --   --   --   --   ALBUMIN 3.2*  --   --   --   --   AST 37  --   --   --   --   ALT 37  --   --   --   --   ALKPHOS 83  --   --   --   --   BILITOT 0.4  --   --   --   --   GFRNONAA 44*   < > 49* 41* 47*  ANIONGAP 10   < > 7 7 6    < > = values in this interval not displayed.     Hematology Recent Labs  Lab 05/21/20 1544 05/22/20 0418 05/23/20 0034  WBC 12.4* 11.5* 12.1*  RBC 3.05* 2.98* 3.34*  HGB 9.0* 8.9* 10.0*  HCT 29.2* 28.7* 31.5*  MCV 95.7 96.3 94.3  MCH 29.5 29.9 29.9  MCHC 30.8 31.0 31.7  RDW 13.2 13.4 13.4  PLT 104* 104* 141*    BNPNo results for input(s): BNP, PROBNP in the last 168 hours.   DDimer No results for input(s): DDIMER in the last 168 hours.   Radiology    DG Chest Port 1 View  Result Date: 05/22/2020 CLINICAL DATA:  CABG EXAM: PORTABLE CHEST 1 VIEW COMPARISON:  05/21/2020 FINDINGS: The cardiomediastinal silhouette is unchanged in contour.Status post median sternotomy and CABG.  Atherosclerotic calcifications of the aorta. RIGHT IJ CVC catheter tip terminates over the SVC. Mediastinal drain and LEFT-sided chest tube. Likely small LEFT pleural effusion, unchanged. No significant pneumothorax. LEFT basilar atelectasis. Visualized abdomen is unremarkable. No acute osseous abnormality. IMPRESSION: Stable likely small LEFT pleural effusion and LEFT basilar atelectasis. No significant pneumothorax. Electronically Signed   By: Valentino Saxon MD   On: 05/22/2020 07:56    Cardiac Studies   LHC 05/15/20 Conclusion    Ost LM to Mid LM lesion is 60% stenosed. Ost Cx lesion is 70% stenosed. Prox LAD to Mid LAD lesion is 80% stenosed. Ost LAD to Prox LAD lesion is 70% stenosed. The left ventricular systolic function is normal. LV end diastolic pressure is normal. The left ventricular ejection fraction is 55-65% by visual estimate.        2D echo 05/16/20 IMPRESSIONS    1. Left ventricular ejection fraction, by estimation, is 60 to 65%. The  left ventricle has normal function. The left ventricle has no regional  wall motion abnormalities. There is mild left ventricular hypertrophy.  Left ventricular diastolic parameters  are consistent with Grade I diastolic dysfunction (impaired relaxation).   2. Right ventricular systolic function is normal. The right ventricular  size is normal. There is normal pulmonary artery systolic pressure. The  estimated right ventricular systolic pressure is 33.3 mmHg.   3. The mitral valve is degenerative. No evidence of mitral valve  regurgitation. No evidence of mitral stenosis. Severe mitral annular  calcification.   4. The aortic valve is abnormal. There is moderate calcification of the  aortic valve. Aortic valve regurgitation is not visualized. Mild to  moderate aortic valve sclerosis/calcification is present, without any  evidence of aortic stenosis.   5. The inferior vena cava is normal in size with greater than 50%  respiratory  variability, suggesting right atrial pressure of 3 mmHg.  Patient Profile     79 y.o. female with remote PAF not on [place] prior to admission, OSA (pt did not f/u with CPAP titration), HTN, HLD, RLS who presented to [facility] with chest pain/NSTEMI, found to have multivessel CAD   She is now s/p CABG  Assessment & Plan   1. NSTEMI/multivessel CAD Pt is d/p CABG (LIMA to LAD; SVG to  OM2)   She is POD 3  Just transferred to floor from ICU Volume statis is pretty good   On oral lasix .  BP is OK If no recurrent aflutter, consider Plavix on d/c  2 Rhythm   Pt had episode of tachycardia  HR 140s,  When she got up to go to the bathroom   Rapid on and off  Sl irreg   Lasted about 4 min   Looks like atrial flutter   Second trial of walking, on arrival back to room HR went up to 120 the slowly dropped   Iniitial episode looks like atrial flutter     I would Rx with increased b blocker and add amiodarone 200 bid (short course for 6 wks) She has reported remote hix of PAF    If recurs then will need anticoagulation      2. Essential HTN  BP is OK     3. Hyperlipidemia - LDL 136, goal <70 -  Keep on lipitor   WIll need outpt follow up    4  Renal   Stage 3B  Cr 1.18  Follow     5 OSA Remote dx   SHould have eval after recovers   - recommend revisiting as OP   For questions or updates, please contact [place] Please consult [email or web address] for contact info under Cardiology/STEMI.  Signed, Dorris Carnes, MD 05/23/2020, 3:13 PM

## 2020-05-23 NOTE — Progress Notes (Signed)
Pt transferred to 4E24 via wheelchair.  Skin assessed, CHG completed. VSS.

## 2020-05-23 NOTE — Telephone Encounter (Signed)
[facility] at [employer] received FMLA paperwork for Danielle Dess, [place]'s son.  Placed forms in Dr. Kennon Holter mailbox 05/23/20

## 2020-05-23 NOTE — Progress Notes (Signed)
CARDIAC REHAB PHASE I   PRE:  Rate/Rhythm: 87 SR  BP:  Sitting: 146/71      SaO2: 94 RA  MODE:  Ambulation: 290 ft   POST:  Rate/Rhythm: 120 ST with PACs and PVCs  BP:  Sitting: 126/105    SaO2: 94 RA   Pt helped to BR than ambulated 238ft in hallway assist of one with EVA. Pt with slow, steady gait. Pt denies pain, SOB, palpitations, or dizziness. Pt returned to recliner. Call bell and bedside table within reach. Encouraged continued ambulation and IS use. Pt states she is depressed and ready to go home. Support and encouragement provided. Will continue to follow.  [id number] Donna Falco, RN BSN 05/23/2020 8:40 AM

## 2020-05-23 NOTE — Progress Notes (Signed)
Ambulated patient in hallway 200 ft, tolerated well on RA, SBA, no walker.

## 2020-05-24 LAB — GLUCOSE, CAPILLARY: Glucose-Capillary: 90 mg/dL (ref 70–99)

## 2020-05-24 MED ORDER — FUROSEMIDE 40 MG PO TABS
40.0000 mg | ORAL_TABLET | Freq: Once | ORAL | Status: AC
  Administered 2020-05-24: 40 mg via ORAL
  Filled 2020-05-24: qty 1

## 2020-05-24 MED ORDER — ASPIRIN 325 MG PO TBEC
325.0000 mg | DELAYED_RELEASE_TABLET | Freq: Every day | ORAL | 0 refills | Status: DC
Start: 2020-05-25 — End: 2023-08-04

## 2020-05-24 NOTE — Progress Notes (Signed)
Mobility Specialist: Progress Note   05/24/20 1351  Mobility  Activity Ambulated in hall  Level of Assistance Modified independent, requires aide device or extra time  Assistive Device Front wheel walker  Distance Ambulated (ft) 350 ft  Mobility Response Tolerated well  Mobility performed by Mobility specialist  Bed Position Chair  $Mobility charge 1 Mobility   Pre-Mobility: 82 HR, 95% SpO2 Post-Mobility: 86 HR, 121/71 BP, 97% SpO2  Pt c/o of feeling weak halfway through ambulation. Pt also c/o pain and requested pain medication, RN notified.   [facility] Kaily Wragg Mobility Specialist

## 2020-05-24 NOTE — Plan of Care (Signed)
Progressing, will continue to monitor.  

## 2020-05-24 NOTE — Progress Notes (Addendum)
      [address]       ,[place] [address]             [phone]        4 Days Post-Op Procedure(s) (LRB): CORONARY ARTERY BYPASS GRAFTING (CABG) TIMES TWO USING LEFT INTERNAL MAMMARY ARTERY AND RIGHT GREATER SAPHENOUS VEIN HARVESTED ENDOSCOPICALLY (N/A) TRANSESOPHAGEAL ECHOCARDIOGRAM (TEE) (N/A)  Subjective: Patient states her toes/feet are very swollen  Objective: Vital signs in last 24 hours: Temp:  [98 F (36.7 C)-99.6 F (37.6 C)] 98.3 F (36.8 C) (10/23 0726) Pulse Rate:  [76-114] 80 (10/23 0726) Cardiac Rhythm: Normal sinus rhythm (10/22 1946) Resp:  [16-22] 16 (10/23 0726) BP: (110-137)/(59-75) 134/67 (10/23 0726) SpO2:  [92 %-96 %] 96 % (10/23 0726) Weight:  [87.6 kg-88 kg] 87.6 kg (10/23 0639)  Pre op weight 84.6 kg Current Weight  05/24/20 87.6 kg       Intake/Output from previous day: 10/22 0701 - 10/23 0700 In: 120 [P.O.:120] Out: -    Physical Exam:  Cardiovascular: RRR Pulmonary: Slightly diminished bibasilar breath sounds Abdomen: Soft, non tender, bowel sounds present. Extremities:Bilateral lower extremity edema. Wounds: Clean and dry.  No erythema or signs of infection.  Lab Results: CBC: Recent Labs    05/22/20 0418 05/23/20 0034  WBC 11.5* 12.1*  HGB 8.9* 10.0*  HCT 28.7* 31.5*  PLT 104* 141*   BMET:  Recent Labs    05/22/20 0418 05/23/20 0034  NA 136 138  K 4.6 5.0  CL 106 108  CO2 23 24  GLUCOSE 114* 112*  BUN 21 20  CREATININE 1.26* 1.18*  CALCIUM 9.3 9.7    PT/INR:  Lab Results  Component Value Date   INR 1.5 (H) 05/20/2020   INR 1.0 06/24/2008   ABG:  INR: Will add last result for INR, ABG once components are confirmed Will add last 4 CBG results once components are confirmed  Assessment/Plan:  1. CV - History of PAF. SR with HR in the 80's. On Amiodarone 400 mg bid, Lopressor 25 mg bid. 2.  Pulmonary - On room air. Check PA/LAT CXR in am. Encourage incentive spirometer. 3. Volume  Overload - On Lasix 40 mg daily but will give bid today 4.  Expected post op acute blood loss anemia - H and H yesterday 10 and 31.5 5. CBGs 105/108/90. Pre op HGA1C 5.7. Stop accu checks and SS PRN 6. Likely discharge in am  Donielle M ZimmermanPA-C 05/24/2020,8:49 AM    Chart reviewed, patient examined, agree with above. Still has some pedal edema. Wt is probably 7 lbs over preop. Continue diuretic. Plan home in am if no changes.

## 2020-05-24 NOTE — Progress Notes (Addendum)
CARDIAC REHAB PHASE I   PRE:  Rate/Rhythm: 88 Sr  BP:  Supine:   Sitting: 123/72  Standing:    SaO2: 97 RA  MODE:  Ambulation: 420 ft   POST:  Rate/Rhythm: 105 ST with PAC's  BP:  Supine:   Sitting: 146/64  Standing:    SaO2: 98 RA 0955-1050 Assisted X 1 and used walker to ambulate. Gait steady with walker. Pt walked 420 feet without c/o.VS stable. Pt back to recliner after walk with call light in reach. Completed discharge education with pt. Discussed sternal precautions, exercise guidelines, heart healthy diet, use of IS and Outpt. CRP. Pt voiecs understanding. Will send referral to [place].  Rodney Langton RN 05/24/2020 10:43 AM

## 2020-05-25 ENCOUNTER — Inpatient Hospital Stay (HOSPITAL_COMMUNITY): Payer: Medicare Other

## 2020-05-25 ENCOUNTER — Other Ambulatory Visit: Payer: Self-pay | Admitting: Physician Assistant

## 2020-05-25 LAB — CBC
HCT: 31 % — ABNORMAL LOW (ref 36.0–46.0)
Hemoglobin: 10.1 g/dL — ABNORMAL LOW (ref 12.0–15.0)
MCH: 29.8 pg (ref 26.0–34.0)
MCHC: 32.6 g/dL (ref 30.0–36.0)
MCV: 91.4 fL (ref 80.0–100.0)
Platelets: 199 10*3/uL (ref 150–400)
RBC: 3.39 MIL/uL — ABNORMAL LOW (ref 3.87–5.11)
RDW: 13.3 % (ref 11.5–15.5)
WBC: 9.5 10*3/uL (ref 4.0–10.5)
nRBC: 0 % (ref 0.0–0.2)

## 2020-05-25 LAB — BASIC METABOLIC PANEL
Anion gap: 10 (ref 5–15)
BUN: 23 mg/dL (ref 8–23)
CO2: 27 mmol/L (ref 22–32)
Calcium: 9.5 mg/dL (ref 8.9–10.3)
Chloride: 104 mmol/L (ref 98–111)
Creatinine, Ser: 1.27 mg/dL — ABNORMAL HIGH (ref 0.44–1.00)
GFR, Estimated: 43 mL/min — ABNORMAL LOW (ref >60)
Glucose, Bld: 117 mg/dL — ABNORMAL HIGH (ref 70–99)
Potassium: 3.4 mmol/L — ABNORMAL LOW (ref 3.5–5.1)
Sodium: 141 mmol/L (ref 135–145)

## 2020-05-25 MED ORDER — ATORVASTATIN CALCIUM 80 MG PO TABS
80.0000 mg | ORAL_TABLET | Freq: Every day | ORAL | 1 refills | Status: DC
Start: 2020-05-26 — End: 2020-07-21

## 2020-05-25 MED ORDER — POTASSIUM CHLORIDE CRYS ER 20 MEQ PO TBCR
40.0000 meq | EXTENDED_RELEASE_TABLET | Freq: Two times a day (BID) | ORAL | Status: DC
  Administered 2020-05-25: 40 meq via ORAL
  Filled 2020-05-25: qty 2

## 2020-05-25 MED ORDER — POTASSIUM CHLORIDE CRYS ER 20 MEQ PO TBCR: 20.0000 meq | EXTENDED_RELEASE_TABLET | Freq: Every day | ORAL | 0 refills | Status: DC

## 2020-05-25 MED ORDER — TRAMADOL HCL 50 MG PO TABS
50.0000 mg | ORAL_TABLET | Freq: Four times a day (QID) | ORAL | 0 refills | Status: DC | PRN
Start: 2020-05-25 — End: 2020-05-30

## 2020-05-25 MED ORDER — METOPROLOL SUCCINATE ER 50 MG PO TB24
50.0000 mg | ORAL_TABLET | Freq: Every day | ORAL | 1 refills | Status: DC
Start: 2020-05-25 — End: 2020-07-21

## 2020-05-25 MED ORDER — AMIODARONE HCL 200 MG PO TABS: 200.0000 mg | ORAL_TABLET | Freq: Two times a day (BID) | ORAL | 1 refills | Status: DC

## 2020-05-25 MED ORDER — FUROSEMIDE 40 MG PO TABS: 40.0000 mg | ORAL_TABLET | Freq: Every day | ORAL | 0 refills | Status: DC

## 2020-05-25 NOTE — Progress Notes (Signed)
      [address]       [place] [address]             [phone]        5 Days Post-Op Procedure(s) (LRB): CORONARY ARTERY BYPASS GRAFTING (CABG) TIMES TWO USING LEFT INTERNAL MAMMARY ARTERY AND RIGHT GREATER SAPHENOUS VEIN HARVESTED ENDOSCOPICALLY (N/A) TRANSESOPHAGEAL ECHOCARDIOGRAM (TEE) (N/A)  Subjective: Patient wants to go home. She is starting to have loose stools.  Objective: Vital signs in last 24 hours: Temp:  [97.8 F (36.6 C)-98.2 F (36.8 C)] 98 F (36.7 C) (10/24 0747) Pulse Rate:  [74-85] 81 (10/24 0747) Cardiac Rhythm: Normal sinus rhythm (10/24 0700) Resp:  [18-24] 18 (10/24 0747) BP: (110-132)/(51-71) 123/51 (10/24 0747) SpO2:  [95 %-99 %] 97 % (10/24 0747) Weight:  [86.1 kg] 86.1 kg (10/24 0454)  Pre op weight 84.6 kg Current Weight  05/25/20 86.1 kg       Intake/Output from previous day: 10/23 0701 - 10/24 0700 In: 480 [P.O.:480] Out: -    Physical Exam:  Cardiovascular: RRR Pulmonary: Slightly diminished bibasilar breath sounds Abdomen: Soft, non tender, bowel sounds present. Extremities:Mild bilateral lower extremity edema. Wounds: Clean and dry.  No erythema or signs of infection.  Lab Results: CBC: Recent Labs    05/23/20 0034 05/25/20 0224  WBC 12.1* 9.5  HGB 10.0* 10.1*  HCT 31.5* 31.0*  PLT 141* 199   BMET:  Recent Labs    05/23/20 0034 05/25/20 0224  NA 138 141  K 5.0 3.4*  CL 108 104  CO2 24 27  GLUCOSE 112* 117*  BUN 20 23  CREATININE 1.18* 1.27*  CALCIUM 9.7 9.5    PT/INR:  Lab Results  Component Value Date   INR 1.5 (H) 05/20/2020   INR 1.0 06/24/2008   ABG:  INR: Will add last result for INR, ABG once components are confirmed Will add last 4 CBG results once components are confirmed  Assessment/Plan:  1. CV - History of PAF. SR with HR in the 80's. On Amiodarone 400 mg bid, Lopressor 25 mg bid. 2.  Pulmonary - On room air.  PA/LAT CXR shows cardiomegaly, no pneumothorax and  trace bilateral pleural effusions.  Encourage incentive spirometer. 3. Volume Overload - On Lasix 40 mg daily but will give bid today 4.  Expected post op acute blood loss anemia - H and H this am stable at 10.1 and 31. 5. Supplement potassium 6. Creatinine slightly increased from 1.18 to 1.27. She was given IV Lasix yesterday 7. Stop stool softeners 8. Discharge  Donita Newland M ZimmermanPA-C 05/25/2020,8:57 AM

## 2020-05-26 ENCOUNTER — Telehealth: Payer: Self-pay | Admitting: General Practice

## 2020-05-26 ENCOUNTER — Encounter: Payer: Self-pay | Admitting: Physician Assistant

## 2020-05-26 NOTE — Telephone Encounter (Signed)
Forms from [place] received on 05/23/20.Completed patient authorization and billing sheet attached. Took form to [employer] for completion.Received $29.00 cash payment given to [place]. Billing sheet given to [place]. 05/26/20 fsw

## 2020-05-26 NOTE — Progress Notes (Signed)
[address]       [place] [address]             [phone]      Physician Discharge Summary  Patient ID: Donna Sloan MRN: [id number] DOB/AGE: 1941-05-08 79 y.o.  Admit date: (Not on file) Discharge date: 05/26/2020  Admission Diagnoses: Patient Active Problem List   Diagnosis Date Noted  . NSTEMI (non-ST elevated myocardial infarction) ([place])   . Unstable angina ([place]) 05/14/2020  . PAF (paroxysmal atrial fibrillation) ([place]) 01/15/2014  . Obstructive sleep apnea 01/04/2013  . Hyperlipidemia   . Endometrial/uterine adenocarcinoma ([place]) 11/11/2011  . Essential hypertension 09/15/2007  . TONSILLECTOMY, HX OF 09/15/2007  . CARPAL TUNNEL RELEASE, HX OF 09/15/2007  . DYSKINESIA OF ESOPHAGUS 03/03/2007  . MENOPAUSE, SURGICAL 11/01/2006    Discharge Diagnoses:  Active Problems:   * No active hospital problems. *   Discharged Condition: good  HPI:   79 year old female presented to the emergency department with acute onset chest pain and indigestion.  She was diagnosed with an NSTEMI and taken to the Cath Lab where two-vessel coronary artery disease was discovered.  She currently is chest pain-free.  She does have a history of paroxysmal atrial fibrillation but currently is in sinus rhythm.  She lives at home with her husband and her son, and is the main caregiver for her husband who is nonambulatory.   Hospital Course:   On 10/13 Ms. Shabazz underwent a CABG x 2 with Dr. Kipp Brood. He tolerated the procedure well and was transferred to the ICU  in stable condition. POD 1 she continued to progress. We removed her lines and continued advancing her diet. POD 2 we removed her pleural drains. She was stable to go to the floor. POD 3 she continued to progress. She was placed on Amiodarone for a short run of atrial fibrillation. POD 4 she remained in NSR in the 80s. We continued Amio 400mg  BID and Lopressor. We stopped her accu checks. She started to have  some loose stools on POD 5 so we stopped her stool softeners. Her creatinine increased slightly but we changed her lasix to PO. Today, she is tolerating room air, her incisions are healing well, she is ambulating with limited assistance, and she was ready for discharge home.   Consults: None  Significant Diagnostic Studies:   CLINICAL DATA:  Pleural effusion  EXAM: CHEST - 2 VIEW  COMPARISON:  Three days ago  FINDINGS: Left chest tube removal. Trace bilateral pleural effusion. Cardiopericardial enlargement. The right IJ catheter has been removed. No edema or pneumothorax.  IMPRESSION: Hardware removal with stable chest, including trace pleural effusions. No visible pneumothorax.   Electronically Signed   By: Monte Fantasia M.D.   On: 05/25/2020 07:22  Treatments:  05/20/2020 Patient:  Donna Sloan Pre-Op Dx: Jerl Santos CAD                         NSTEMI                         HTN                         Atrial Fibrillation                         Morbid Obesity  Post-op Dx:  Same Procedure: CABG X 2.  LIMA LAD, RSVG OM2  Endoscopic greater saphenous vein harvest on the Left Intra-operative Transesophageal Echocardiogram  Surgeon and Role:      * Lightfoot, Lucile Crater, MD - Primary    * T. Harriet Pho, PA-C - assisting  Anesthesia  general EBL:  522ml Blood Administration: none Xclamp Time:  36 min Pump Time:  72min  Drains: 22 F blake drain: L, mediastinal  Wires: none Counts: correct   Indications: 79 year old female presented to the emergency department with acute onset chest pain and indigestion. She was diagnosed with an NSTEMI and taken to the Cath Lab where two-vessel coronary artery disease was discovered. She currently is chest pain-free. She does have a history of paroxysmal atrial fibrillation but currently is in sinus rhythm.  Findings: Good conduit, good LIMA.  Very small left atrial appendage.  It measures to   69mm, therefore I elected to forgo placement of the atrial clip.  Calcified LAD.  Good OM.  Hypovolemic coming off pump.  Good function on TEE.  Discharge Exam: There were no vitals taken for this visit.   Cardiovascular: RRR Pulmonary: Slightly diminished bibasilar breath sounds Abdomen: Soft, non tender, bowel sounds present. Extremities:Mild bilateral lower extremity edema. Wounds: Clean and dry.  No erythema or signs of infection.  Disposition:  There are no questions and answers to display.         Allergies as of 05/26/2020      Reactions   Penicillins    Has patient had a PCN reaction causing immediate rash, facial/tongue/throat swelling, SOB or lightheadedness with hypotension: Yes Has patient had a PCN reaction causing severe rash involving mucus membranes or skin necrosis: No Has patient had a PCN reaction that required hospitalization: No Has patient had a PCN reaction occurring within the last 10 years: No If all of the above answers are "NO", then may proceed with Cephalosporin use.   Rosuvastatin Nausea And Vomiting   Simvastatin Nausea And Vomiting      Medication List       Accurate as of May 26, 2020  2:50 PM. If you have any questions, ask your nurse or doctor.        amiodarone 200 MG tablet Commonly known as: PACERONE Take 1 tablet (200 mg total) by mouth 2 (two) times daily. For 10 days then take 200 mg daily thereafter   aspirin 325 MG EC tablet Take 1 tablet (325 mg total) by mouth daily.   atorvastatin 80 MG tablet Commonly known as: LIPITOR Take 1 tablet (80 mg total) by mouth daily.   bismuth subsalicylate 371 GG/26RS suspension Commonly known as: PEPTO BISMOL Take 30 mLs by mouth every 6 (six) hours as needed for indigestion.   furosemide 40 MG tablet Commonly known as: LASIX Take 1 tablet (40 mg total) by mouth daily. For 5 days then stop   metoprolol succinate 50 MG 24 hr tablet Commonly known as: TOPROL-XL Take 1 tablet (50  mg total) by mouth daily.   potassium chloride SA 20 MEQ tablet Commonly known as: KLOR-CON Take 1 tablet (20 mEq total) by mouth daily. For 5 days then stop.   traMADol 50 MG tablet Commonly known as: ULTRAM Take 1 tablet (50 mg total) by mouth every 6 (six) hours as needed for moderate pain.       The patient has been discharged on:   1.Beta Blocker:  Yes [ x  ]  No   [   ]                              If No, reason:  2.Ace Inhibitor/ARB: Yes [   ]                                     No  [ x   ]                                     If No, reason: normal BP  3.Statin:   Yes [ x  ]                  No  [   ]                  If No, reason:  4.Ecasa:  Yes  [x   ]                  No   [   ]                  If No, reason:    Signed: Elgie Collard 05/26/2020, 2:50 PM

## 2020-05-27 DIAGNOSIS — Z48812 Encounter for surgical aftercare following surgery on the circulatory system: Secondary | ICD-10-CM | POA: Diagnosis not present

## 2020-05-28 ENCOUNTER — Telehealth: Payer: Self-pay | Admitting: General Practice

## 2020-05-28 ENCOUNTER — Telehealth: Payer: Self-pay | Admitting: Cardiovascular Disease

## 2020-05-28 MED FILL — Lidocaine HCl Local Preservative Free (PF) Inj 2%: INTRAMUSCULAR | Qty: 15 | Status: AC

## 2020-05-28 NOTE — Telephone Encounter (Signed)
Pt c/o medication issue:  1. Name of Medication: atorvastatin (LIPITOR) 80 MG tablet [[id number]]    2. How are you currently taking this medication (dosage and times per day)? atorvastatin (LIPITOR) 80 MG tablet [[id number]]    3. Are you having a reaction (difficulty breathing--STAT)?  no  4. What is your medication issue? Pt called in and stated that she rec'd this med in the hosp and she can not take this med.  She is stated she has tried this meds and throws it up ,  She can not get this med down.  She has had trouble with this in the past.  Please advice   Best number [phone]

## 2020-05-28 NOTE — Telephone Encounter (Signed)
Left message to call back  

## 2020-05-28 NOTE — Telephone Encounter (Signed)
Routed to CVRR to assist with statin concerns/side effects

## 2020-05-28 NOTE — Telephone Encounter (Signed)
Spoke with patient - she states she is unable to swallow atorvastatin tablets even when she halved them. She will throw them up. She has been able tolerate pravastatin 20mg  daily - unsure if she can take this instead, maybe w/zetia? (which she said she would be willing to try). Her LDL was 136 during hospitalization

## 2020-05-28 NOTE — Telephone Encounter (Signed)
Would really prefer she be on high intensity statin. She can crush atorvastatin and put in apple sauce (or other soft food) and eat. Or we could try rosuvastatin 20 (those tablets should be smaller).

## 2020-05-28 NOTE — Telephone Encounter (Signed)
Duplicate encounter created regarding 1 message, which has been documented on in previous encounter

## 2020-05-28 NOTE — Telephone Encounter (Signed)
Patient was returning phone call. Please call back 

## 2020-05-28 NOTE — Telephone Encounter (Signed)
Patient called - notified she can crush atorvastatin & mix in soft food. She will try this and notify office if not tolerated

## 2020-05-28 NOTE — Telephone Encounter (Signed)
Due to patient having NSTEMI 2 weeks ago, recommend patient stay on high intensity statin.  Would she be willing to try Crestor?

## 2020-05-30 ENCOUNTER — Ambulatory Visit (INDEPENDENT_AMBULATORY_CARE_PROVIDER_SITE_OTHER): Payer: Self-pay | Admitting: Thoracic Surgery (Cardiothoracic Vascular Surgery)

## 2020-05-30 ENCOUNTER — Encounter: Payer: Self-pay | Admitting: Thoracic Surgery (Cardiothoracic Vascular Surgery)

## 2020-05-30 ENCOUNTER — Other Ambulatory Visit: Payer: Self-pay

## 2020-05-30 VITALS — BP 133/65 | HR 75 | Temp 98.1°F | Resp 20 | Ht 61.5 in | Wt 185.0 lb

## 2020-05-30 DIAGNOSIS — Z951 Presence of aortocoronary bypass graft: Secondary | ICD-10-CM

## 2020-05-30 MED ORDER — FUROSEMIDE 40 MG PO TABS
40.0000 mg | ORAL_TABLET | Freq: Every day | ORAL | 0 refills | Status: DC
Start: 2020-05-30 — End: 2020-10-13

## 2020-05-30 MED ORDER — TRAMADOL HCL 50 MG PO TABS: 50.0000 mg | ORAL_TABLET | Freq: Four times a day (QID) | ORAL | 0 refills | Status: DC | PRN

## 2020-05-30 NOTE — Progress Notes (Signed)
° °   °  [address]       [place] [address]             [phone]        [facility] Medical Record #[id number] Date of Birth: 08-10-1940  Referring: Lorretta Harp, MD Primary Care: Stephens Shire, MD Primary Cardiologist:Jonathan Gwenlyn Found, MD  Reason for visit:   follow-up  History of Present Illness:     Donna Sloan comes in for 1 week follow-up appointment.  She is doing very well at home.  She is up and walking around and getting good support from her sons.  Physical Exam: BP 133/65    Pulse 75    Temp 98.1 F (36.7 C) (Skin)    Resp 20    Ht 5' 1.5" (1.562 m)    Wt 185 lb (83.9 kg)    SpO2 91%    BMI 34.39 kg/m   Alert NAD Incision clean.  Sternum stable Abdomen soft, ND Mild peripheral edema       Assessment / Plan:   79 year old female status post CABG doing well. She will need to continue her Lasix for 1 more week Given her refill on her tramadol. She will follow-up with me in 1 month with a chest x-ray.   Lajuana Matte 05/30/2020 5:30 PM

## 2020-06-02 ENCOUNTER — Telehealth (HOSPITAL_COMMUNITY): Payer: Self-pay

## 2020-06-02 NOTE — Telephone Encounter (Signed)
Pt insurance is active and benefits verified through Medicare a/b Co-pay 0, DED $203/$203 met, out of pocket 0/0 met, co-insurance 20%. no pre-authorization required. Passport, 06/02/2020_0 :29am, REF# [id number]  2ndary insurance is active and benefits verified through [facility] Co-pay 0, DED 0/0 met, out of pocket 0/0 met, co-insurance 0%. No pre-authorization required. Passport, 06/02/2020_1 :48am, REF# [id number]   Will contact patient to see if she is interested in the Cardiac Rehab Program. If interested, patient will need to complete follow up appt. Once completed, patient will be contacted for scheduling upon review by the RN Navigator.

## 2020-06-10 NOTE — Discharge Summary (Signed)
Physician Discharge Summary       [address]       [place] [address]             [phone]       Patient ID: Donna Sloan MRN: [id number] DOB/AGE: 78/01/1941 78 y.o.  Admit date: (Not on file) Discharge date: 05/26/2020  Admission Diagnoses:     Patient Active Problem List   Diagnosis Date Noted  . NSTEMI (non-ST elevated myocardial infarction) ([place])   . Unstable angina ([place]) 05/14/2020  . PAF (paroxysmal atrial fibrillation) ([place]) 01/15/2014  . Obstructive sleep apnea 01/04/2013  . Hyperlipidemia   . Endometrial/uterine adenocarcinoma ([place]) 11/11/2011  . Essential hypertension 09/15/2007  . TONSILLECTOMY, HX OF 09/15/2007  . CARPAL TUNNEL RELEASE, HX OF 09/15/2007  . DYSKINESIA OF ESOPHAGUS 03/03/2007  . MENOPAUSE, SURGICAL 11/01/2006    Discharge Diagnoses:  Active Problems:   * No active hospital problems. *   Discharged Condition: good  HPI:   79 year old female presented to the emergency department with acute onset chest pain and indigestion. She was diagnosed with an NSTEMI and taken to the Cath Lab where two-vessel coronary artery disease was discovered. She currently is chest pain-free. She does have a history of paroxysmal atrial fibrillation but currently is in sinus rhythm.  She lives at home with her husband and her son, and is the main caregiver for her husband who is nonambulatory.   Hospital Course:   On 10/13 Donna Sloan underwent a CABG x 2 with Dr. Kipp Brood. He tolerated the procedure well and was transferred to the ICU  in stable condition. POD 1 she continued to progress. We removed her lines and continued advancing her diet. POD 2 we removed her pleural drains. She was stable to go to the floor. POD 3 she continued to progress. She was placed on Amiodarone for a short run of atrial fibrillation. POD 4 she remained in NSR in the 80s. We continued Amio 400mg  BID and Lopressor. We stopped her  accu checks. She started to have some loose stools on POD 5 so we stopped her stool softeners. Her creatinine increased slightly but we changed her lasix to PO. Today, she is tolerating room air, her incisions are healing well, she is ambulating with limited assistance, and she was ready for discharge home.   Consults: None  Significant Diagnostic Studies:   CLINICAL DATA: Pleural effusion  EXAM: CHEST - 2 VIEW  COMPARISON: Three days ago  FINDINGS: Left chest tube removal. Trace bilateral pleural effusion. Cardiopericardial enlargement. The right IJ catheter has been removed. No edema or pneumothorax.  IMPRESSION: Hardware removal with stable chest, including trace pleural effusions. No visible pneumothorax.   Electronically Signed By: Monte Fantasia M.D. On: 05/25/2020 07:22  Treatments:  05/20/2020 Patient:Donna Sloan Pre-Op Dx:2V CAD NSTEMI HTN Atrial Fibrillation Morbid Obesity  Post-op OZ:HYQM Procedure: CABG X2. LIMA LAD, RSVG OM2 Endoscopic greater saphenous vein harvest on theLeft Intra-operative Transesophageal Echocardiogram  Surgeon and Role:  * Lightfoot, Lucile Crater, MD - Primary * T. Harriet Pho, PA-C- assisting  Anesthesiageneral EBL:519ml Blood Administration:none Xclamp Time:55min Pump Time:52min  Drains:39F blake drain: L, mediastinal  Wires:none Counts: correct   Indications: 79 year old female presented to the emergency department with acute onset chest pain and indigestion. She was diagnosed with an NSTEMI and taken to the Cath Lab where two-vessel coronary artery disease was discovered. She currently is chest pain-free. She does have a history of paroxysmal atrial fibrillation but currently is in sinus  rhythm.  Findings: Good conduit, good LIMA. Very small  left atrial appendage. It measures to 48mm, therefore I elected to forgo placement of the atrial clip. Calcified LAD. Good OM. Hypovolemic coming off pump. Good function on TEE.  Discharge Exam: There were no vitals taken for this visit.   Cardiovascular: RRR Pulmonary: Slightly diminished bibasilar breath sounds Abdomen: Soft, non tender, bowel sounds present. Extremities:Mild bilateral lower extremity edema. Wounds: Clean and dry. No erythema or signs of infection.  Disposition:  There are no questions and answers to display.             Allergies as of 05/26/2020      Reactions   Penicillins    Has patient had a PCN reaction causing immediate rash, facial/tongue/throat swelling, SOB or lightheadedness with hypotension: Yes Has patient had a PCN reaction causing severe rash involving mucus membranes or skin necrosis: No Has patient had a PCN reaction that required hospitalization: No Has patient had a PCN reaction occurring within the last 10 years: No If all of the above answers are "NO", then may proceed with Cephalosporin use.   Rosuvastatin Nausea And Vomiting   Simvastatin Nausea And Vomiting         Medication List       Accurate as of May 26, 2020  2:50 PM. If you have any questions, ask your nurse or doctor.        amiodarone 200 MG tablet Commonly known as: PACERONE Take 1 tablet (200 mg total) by mouth 2 (two) times daily. For 10 days then take 200 mg daily thereafter   aspirin 325 MG EC tablet Take 1 tablet (325 mg total) by mouth daily.   atorvastatin 80 MG tablet Commonly known as: LIPITOR Take 1 tablet (80 mg total) by mouth daily.   bismuth subsalicylate 350 KX/38HW suspension Commonly known as: PEPTO BISMOL Take 30 mLs by mouth every 6 (six) hours as needed for indigestion.   furosemide 40 MG tablet Commonly known as: LASIX Take 1 tablet (40 mg total) by mouth daily. For 5 days then stop   metoprolol  succinate 50 MG 24 hr tablet Commonly known as: TOPROL-XL Take 1 tablet (50 mg total) by mouth daily.   potassium chloride SA 20 MEQ tablet Commonly known as: KLOR-CON Take 1 tablet (20 mEq total) by mouth daily. For 5 days then stop.   traMADol 50 MG tablet Commonly known as: ULTRAM Take 1 tablet (50 mg total) by mouth every 6 (six) hours as needed for moderate pain.       The patient has been discharged on:   1.Beta Blocker:  Yes [ x  ]                              No   [   ]                              If No, reason:  2.Ace Inhibitor/ARB: Yes [   ]                                     No  [ x   ]  If No, reason: normal BP  3.Statin:   Yes [ x  ]                  No  [   ]                  If No, reason:  4.Ecasa:  Yes  [x   ]                  No   [   ]                  If No, reason:    Signed: Elgie Collard 05/26/2020, 2:50PM Updated: 06/10/2020

## 2020-06-11 NOTE — Progress Notes (Signed)
Cardiology Clinic Note   Patient Name: Skylar Flynt Date of Encounter: 06/12/2020  Primary Care Provider:  Aletha Halim., PA-C Primary Cardiologist:  Quay Burow, MD  Patient Profile    Cristopher Peru. Rother 79 year old female presents the clinic today status post CABG x2 on 05/20/2020.  Past Medical History    Past Medical History:  Diagnosis Date  . Chest pain    2D ECHO, 11/24/2010 - EF >55%, NUCLEAR STRESS TEST, 11/24/2010 - normal, no EKG changes for ischemia  . Claudication ([place])    LEA DUPLEX, 12/15/2010 - normal scan  . Endometrial adenocarcinoma ([facility])    Stage I  . GERD (gastroesophageal reflux disease)   . Hyperlipidemia    statin intolerant  . Hypertension   . Urinary incontinence    Past Surgical History:  Procedure Laterality Date  . BLADDER SUSPENSION    . CARPAL TUNNEL RELEASE    . CORONARY ARTERY BYPASS GRAFT N/A 05/20/2020   Procedure: CORONARY ARTERY BYPASS GRAFTING (CABG) TIMES TWO USING LEFT INTERNAL MAMMARY ARTERY AND RIGHT GREATER SAPHENOUS VEIN HARVESTED ENDOSCOPICALLY;  Surgeon: Lajuana Matte, MD;  Location: [facility];  Service: Open Heart Surgery;  Laterality: N/A;  . LAPAROSCOPIC ASSISTED VAGINAL HYSTERECTOMY  12/2006   BSO with anterior colporrhaphy  . LEFT HEART CATH AND CORONARY ANGIOGRAPHY N/A 05/15/2020   Procedure: LEFT HEART CATH AND CORONARY ANGIOGRAPHY;  Surgeon: Lorretta Harp, MD;  Location: [facility];  Service: Cardiovascular;  Laterality: N/A;  . TEE WITHOUT CARDIOVERSION N/A 05/20/2020   Procedure: TRANSESOPHAGEAL ECHOCARDIOGRAM (TEE);  Surgeon: Lajuana Matte, MD;  Location: [facility];  Service: Open Heart Surgery;  Laterality: N/A;    Allergies  Allergies  Allergen Reactions  . Penicillins     Has patient had a PCN reaction causing immediate rash, facial/tongue/throat swelling, SOB or lightheadedness with hypotension: Yes Has patient had a PCN reaction causing severe rash involving mucus membranes or skin  necrosis: No Has patient had a PCN reaction that required hospitalization: No Has patient had a PCN reaction occurring within the last 10 years: No If all of the above answers are "NO", then may proceed with Cephalosporin use.  . Rosuvastatin Nausea And Vomiting  . Simvastatin Nausea And Vomiting    History of Present Illness    Ms. Roselli has a PMH of paroxysmal atrial fibrillation, essential hypertension, unstable angina, NSTEMI, OSA, hyperlipidemia, and coronary artery disease (status post CABG x2-19 21).  She was noted to the hospital 05/14/2020 and seen by cardiology in consultation for an evaluation of her chest pain.  She had previously been seen by Dr. Gwenlyn Found 9/20.  She was doing well at that time.  She was found to be in sinus rhythm and not on anticoagulation.  Her hypertension was controlled by lisinopril, hydrochlorothiazide, and BB.  She was taking pravastatin for her HLD however, was taking it sporadically.  She presented to [facility] emergency department with complaints of chest pain and indigestion.  She has history of GERD and tried to treat with antacid medication which did not relieve her pain.  She did however report increased indigestion after eating.  She also reported a near syncopal episode upon getting out of the shower and also reported becoming diaphoretic with the episode.  Her high-sensitivity troponins were 259-> 152 and her white blood cell count was 10.8.  Her EKG showed sinus rhythm with a heart rate of 72 with no acute changes.  She underwent cardiac catheterization 05/15/2020 which showed  60% OST LM-mid LAD lesion 60%, OST circumflex lesion 70%, proximal LAD-mid LAD 80%, OST LAD-proximal LAD 70%, and LVEF 55-65%.  A consultation with CVTS was recommended for possible CABG.  She was seen and evaluated by Dr. Kipp Brood.  She underwent CABG x2 05/20/2020 (LIMA-LAD, SVG-OM 2).  On postop day 3 she was placed on amiodarone for a short run of atrial fibrillation.  On  postop day 4 she remained in normal sinus rhythm in the 80s.  Her amiodarone 400 mg twice daily and Lopressor were continued.  She was discharged home on 05/25/2020 in stable condition.  She was seen for follow-up by Dr. Kipp Brood on 05/30/2020.  She reported doing well at home.  She continued to walk around and was receiving help from her sons.  Her Lasix was continued for 1 more week.  She was instructed to follow-up in 1 month for follow-up chest x-ray.  She presents to the clinic today for follow-up evaluation states she is increasing her physical activity at home and adhering to her sternal precautions.  She has not been doing as much with her upper arms.  She feels that her recovery is going fairly well.  However, she states that she does not feel that she will go through surgery again if it is needed.  She did not have a great experience at the hospital.  She does not go into further detail.  She is following a heart healthy low-sodium diet and has not noticed any accelerated heart rate or palpitations.  Her son accompanies her today and states that he turned in FMLA paperwork to be signed back on 05/15/2020 and has not had paperwork turned into the city yet.  He states that he has until 17th to return paperwork for FMLA.  FMLA paperwork located reviewed/filled in, and given to patient.  I will give her the salty 6 diet sheet, have her continue to increase her physical activity adhering to sternal precautions, and follow-up with Dr. Gwenlyn Found in 3 months.  Today she denies chest pain, shortness of breath, lower extremity edema, fatigue, palpitations, melena, hematuria, hemoptysis, diaphoresis, weakness, presyncope, syncope, orthopnea, and PND.   Home Medications    Prior to Admission medications   Medication Sig Start Date End Date Taking? Authorizing Provider  amiodarone (PACERONE) 200 MG tablet Take 1 tablet (200 mg total) by mouth 2 (two) times daily. For 10 days then take 200 mg daily thereafter  05/25/20   Nani Skillern, PA-C  aspirin EC 325 MG EC tablet Take 1 tablet (325 mg total) by mouth daily. 05/25/20   Nani Skillern, PA-C  atorvastatin (LIPITOR) 80 MG tablet Take 1 tablet (80 mg total) by mouth daily. 05/26/20   Nani Skillern, PA-C  bismuth subsalicylate (PEPTO BISMOL) 262 MG/15ML suspension Take 30 mLs by mouth every 6 (six) hours as needed for indigestion.    [provider]  furosemide (LASIX) 40 MG tablet Take 1 tablet (40 mg total) by mouth daily. For 5 days then stop 05/30/20   Lajuana Matte, MD  metoprolol succinate (TOPROL-XL) 50 MG 24 hr tablet Take 1 tablet (50 mg total) by mouth daily. 05/25/20   Nani Skillern, PA-C  potassium chloride SA (KLOR-CON) 20 MEQ tablet Take 1 tablet (20 mEq total) by mouth daily. For 5 days then stop. 05/25/20   Nani Skillern, PA-C  traMADol (ULTRAM) 50 MG tablet Take 1 tablet (50 mg total) by mouth every 6 (six) hours as needed for moderate pain.  05/30/20   Lajuana Matte, MD    Family History    Family History  Problem Relation Age of Onset  . Heart attack Father 21       Massive - deceased  . Heart attack Brother 57       Massive - deceased  . Heart attack Maternal Grandfather   . Kidney disease Maternal Grandmother   . Heart attack Paternal Grandfather    She indicated that her mother is deceased. She indicated that her father is deceased. She indicated that her brother is deceased. She indicated that her maternal grandmother is deceased. She indicated that her maternal grandfather is deceased. She indicated that her paternal grandmother is deceased. She indicated that her paternal grandfather is deceased.  Social History    Social History   Socioeconomic History  . Marital status: Married    Spouse name: Not on file  . Number of children: Not on file  . Years of education: Not on file  . Highest education level: Not on file  Occupational History  . Not on file    Tobacco Use  . Smoking status: Never Smoker  . Smokeless tobacco: Never Used  Vaping Use  . Vaping Use: Never used  Substance and Sexual Activity  . Alcohol use: No    Alcohol/week: 0.0 standard drinks  . Drug use: No  . Sexual activity: Not on file  Other Topics Concern  . Not on file  Social History Narrative   Lives with husband.  Has 2 sons.  Retired from [employer] working.  Education: high school.     Social Determinants of Health   Financial Resource Strain:   . Difficulty of Paying Living Expenses: Not on file  Food Insecurity:   . Worried About Charity fundraiser in the Last Year: Not on file  . Ran Out of Food in the Last Year: Not on file  Transportation Needs:   . Lack of Transportation (Medical): Not on file  . Lack of Transportation (Non-Medical): Not on file  Physical Activity:   . Days of Exercise per Week: Not on file  . Minutes of Exercise per Session: Not on file  Stress:   . Feeling of Stress : Not on file  Social Connections:   . Frequency of Communication with Friends and Family: Not on file  . Frequency of Social Gatherings with Friends and Family: Not on file  . Attends Religious Services: Not on file  . Active Member of Clubs or Organizations: Not on file  . Attends Archivist Meetings: Not on file  . Marital Status: Not on file  Intimate Partner Violence:   . Fear of Current or Ex-Partner: Not on file  . Emotionally Abused: Not on file  . Physically Abused: Not on file  . Sexually Abused: Not on file     Review of Systems    General:  No chills, fever, night sweats or weight changes.  Cardiovascular:  No chest pain, dyspnea on exertion, edema, orthopnea, palpitations, paroxysmal nocturnal dyspnea. Dermatological: No rash, lesions/masses Respiratory: No cough, dyspnea Urologic: No hematuria, dysuria Abdominal:   No nausea, vomiting, diarrhea, bright red blood per rectum, melena, or hematemesis Neurologic:  No visual changes, wkns,  changes in mental status. All other systems reviewed and are otherwise negative except as noted above.  Physical Exam    VS:  BP (!) 142/80   Pulse 72   Ht 5\' 2"  (1.575 m)   Wt 179 lb 6.4  oz (81.4 kg)   BMI 32.81 kg/m  , BMI Body mass index is 32.81 kg/m. GEN: Well nourished, well developed, in no acute distress. HEENT: normal. Neck: Supple, no JVD, carotid bruits, or masses. Cardiac: RRR, no murmurs, rubs, or gallops. No clubbing, cyanosis, edema.  Radials/DP/PT 2+ and equal bilaterally.  Respiratory:  Respirations regular and unlabored, clear to auscultation bilaterally. GI: Soft, nontender, nondistended, BS + x 4. MS: no deformity or atrophy. Skin: warm and dry, no rash. Neuro:  Strength and sensation are intact. Psych: Normal affect.  Accessory Clinical Findings    Recent Labs: 05/20/2020: ALT 37 05/21/2020: Magnesium 2.3 05/25/2020: BUN 23; Creatinine, Ser 1.27; Hemoglobin 10.1; Platelets 199; Potassium 3.4; Sodium 141   Recent Lipid Panel    Component Value Date/Time   CHOL 208 (H) 05/15/2020 0356   CHOL 234 (H) 03/07/2017 1102   TRIG 84 05/15/2020 0356   HDL 55 05/15/2020 0356   HDL 68 03/07/2017 1102   CHOLHDL 3.8 05/15/2020 0356   VLDL 17 05/15/2020 0356   LDLCALC 136 (H) 05/15/2020 0356   LDLCALC 144 (H) 03/07/2017 1102    ECG personally reviewed by me today-normal sinus rhythm ST and T wave abnormality consider inferior ischemia, anterior lateral ischemia 72 bpm.  Cardiac catheterization 05/15/2020  Ost LM to Mid LM lesion is 60% stenosed.  Ost Cx lesion is 70% stenosed.  Prox LAD to Mid LAD lesion is 80% stenosed.  Ost LAD to Prox LAD lesion is 70% stenosed.  The left ventricular systolic function is normal.  LV end diastolic pressure is normal.  The left ventricular ejection fraction is 55-65% by visual estimate. Diagnostic Dominance: Left  Intervention   CVTS consult was recommended and she underwent CABG x2 05/20/2020  Assessment &  Plan   1.  NSTEMI/multivessel coronary artery disease-no chest pain today.  Underwent cardiac catheterization 05/15/2020 and was found to have multivessel coronary artery disease.  She had consult with Dr. Kipp Brood who agreed with proceeding to CABG.  She underwent CABG times 09/12/1919 LIMA-LAD, SVG to OM2 Continue atorvastatin, metoprolol, aspirin Heart healthy low-sodium diet Increase physical activity and continue sternal precautions  Atrial fibrillation/PAF-heart rate today 72 bpm EKG shows normal sinus rhythm 72 bpm ST and T wave abnormality inferior ischemia/anteriolateral ischemia.  Had 1 episode of atrial fibrillation postoperatively.  She was placed on amiodarone and Lopressor Continue metoprolol, amiodarone Heart healthy low-sodium diet-salty 6 given ncrease physical activity and continue sternal precautions  Essential hypertension-BP today 142/80.  Has been well controlled at home. Continue metoprolol, Heart healthy low-sodium diet-salty 6 given Increase physical activity and continue sternal precautions  Hyperlipidemia-05/15/2020: Cholesterol 208; HDL 55; LDL Cholesterol 136; Triglycerides 84; VLDL 17 Continue atorvastatin Heart healthy low-sodium high-fiber diet Increase physical activity and continue sternal precautions   Disposition: Follow-up with Dr. Gwenlyn Found or me in 3 months.    Jossie Ng. Steel Kerney NP-C    06/12/2020, 11:32 AM [address] [place] [address] Office [phone] Fax [phone]  Notice: This dictation was prepared with Dragon dictation along with smaller phrase technology. Any transcriptional errors that result from this process are unintentional and may not be corrected upon review.

## 2020-06-12 ENCOUNTER — Other Ambulatory Visit: Payer: Self-pay

## 2020-06-12 ENCOUNTER — Encounter: Payer: Self-pay | Admitting: General Practice

## 2020-06-12 ENCOUNTER — Ambulatory Visit (INDEPENDENT_AMBULATORY_CARE_PROVIDER_SITE_OTHER): Payer: Medicare Other | Admitting: General Practice

## 2020-06-12 VITALS — BP 142/80 | HR 72 | Ht 62.0 in | Wt 179.4 lb

## 2020-06-12 DIAGNOSIS — I480 Paroxysmal atrial fibrillation: Secondary | ICD-10-CM | POA: Diagnosis not present

## 2020-06-12 DIAGNOSIS — I10 Essential (primary) hypertension: Secondary | ICD-10-CM

## 2020-06-12 DIAGNOSIS — E7800 Pure hypercholesterolemia, unspecified: Secondary | ICD-10-CM | POA: Diagnosis not present

## 2020-06-12 DIAGNOSIS — G4733 Obstructive sleep apnea (adult) (pediatric): Secondary | ICD-10-CM

## 2020-06-12 DIAGNOSIS — I214 Non-ST elevation (NSTEMI) myocardial infarction: Secondary | ICD-10-CM

## 2020-06-12 NOTE — Patient Instructions (Signed)
Medication Instructions:  The current medical regimen is effective;  continue present plan and medications as directed. Please refer to the Current Medication list given to you today. *If you need a refill on your cardiac medications before your next appointment, please call your pharmacy*  Lab Work:   Testing/Procedures:  NONE    NONE  Special Instructions PLEASE FOLLOW YOUR STERNAL PRECAUTIONS  PLEASE READ AND FOLLOW SALTY 6-ATTACHED-1,800mg  daily  PLEASE INCREASE PHYSICAL ACTIVITY AS TOLERATED  Follow-Up: Your next appointment:  3 month(s) In Person with Quay Burow, MD OR IF UNAVAILABLE Perry, FNP-C  At [facility], you and your health needs are our priority.  As part of our continuing mission to provide you with exceptional heart care, we have created designated Provider Care Teams.  These Care Teams include your primary Cardiologist (physician) and Advanced Practice Providers (APPs -  Physician Assistants and Nurse Practitioners) who all work together to provide you with the care you need, when you need it.  We recommend signing up for the patient portal called "MyChart".  Sign up information is provided on this After Visit Summary.  MyChart is used to connect with patients for Virtual Visits (Telemedicine).  Patients are able to view lab/test results, encounter notes, upcoming appointments, etc.  Non-urgent messages can be sent to your provider as well.   To learn more about what you can do with MyChart, go to [email or web address].              6 SALTY THINGS TO AVOID     1,800MG  DAILY

## 2020-06-12 NOTE — Telephone Encounter (Signed)
FMLA forms completed by Dr. Quay Burow and faxed to the [place] on 06/12/20. Patient's son Donna Olea Hick's came by the office to check if forms had been faxed yet to his work at the [employer]. He didn't want a copy of FMLA forms , just was checking status of forms. 06/12/20  fsw

## 2020-06-23 ENCOUNTER — Other Ambulatory Visit: Payer: Self-pay | Admitting: Physician Assistant

## 2020-06-23 NOTE — Telephone Encounter (Signed)
Called and spoke with pt in regards to CR, pt stated she is not interested at this time.   Closed referral 

## 2020-07-03 ENCOUNTER — Other Ambulatory Visit: Payer: Self-pay | Admitting: Thoracic Surgery (Cardiothoracic Vascular Surgery)

## 2020-07-03 DIAGNOSIS — Z951 Presence of aortocoronary bypass graft: Secondary | ICD-10-CM

## 2020-07-04 ENCOUNTER — Other Ambulatory Visit: Payer: Self-pay | Admitting: *Deleted

## 2020-07-04 ENCOUNTER — Other Ambulatory Visit: Payer: Self-pay

## 2020-07-04 ENCOUNTER — Ambulatory Visit
Admission: RE | Admit: 2020-07-04 | Discharge: 2020-07-04 | Disposition: A | Discharge status: Home / Self Care | Payer: Medicare Other | Source: Ambulatory Visit | Attending: Thoracic Surgery (Cardiothoracic Vascular Surgery) | Admitting: Thoracic Surgery (Cardiothoracic Vascular Surgery)

## 2020-07-04 ENCOUNTER — Encounter: Payer: Self-pay | Admitting: Thoracic Surgery (Cardiothoracic Vascular Surgery)

## 2020-07-04 ENCOUNTER — Ambulatory Visit (INDEPENDENT_AMBULATORY_CARE_PROVIDER_SITE_OTHER): Payer: Self-pay | Admitting: Thoracic Surgery (Cardiothoracic Vascular Surgery)

## 2020-07-04 VITALS — Ht 62.0 in

## 2020-07-04 DIAGNOSIS — Z951 Presence of aortocoronary bypass graft: Secondary | ICD-10-CM

## 2020-07-04 DIAGNOSIS — Z09 Encounter for follow-up examination after completed treatment for conditions other than malignant neoplasm: Secondary | ICD-10-CM

## 2020-07-04 DIAGNOSIS — I2510 Atherosclerotic heart disease of native coronary artery without angina pectoris: Secondary | ICD-10-CM

## 2020-07-04 NOTE — Progress Notes (Signed)
      Donna Sloan [address]       [place] [address]             [phone]        Donna Sloan Lead Medical Record #[id number] Date of Birth: 1941/04/27  Referring: Lorretta Harp, MD Primary Care: Aletha Halim., PA-C Primary Cardiologist:Jonathan Gwenlyn Found, MD  Reason for visit:   follow-up  History of Present Illness:     Mrs. Torosyan comes in for her 1 month appointment.  Overall doing well, but complains of some chest wall tenderness  Physical Exam: Ht 5\' 2"  (1.575 m)   BMI 32.81 kg/m   Alert NAD RRR Incision clean.  Sternum stable Abdomen soft, ND no peripheral edema   Diagnostic Studies & Laboratory data: CXR: clear     Assessment / Plan:   S/p CABG, post op afib, currently rate controlled. Doing well  Follow PRN     Lajuana Matte 07/04/2020 3:08 PM

## 2020-07-07 ENCOUNTER — Telehealth (HOSPITAL_COMMUNITY): Payer: Self-pay | Admitting: *Deleted

## 2020-07-07 NOTE — Telephone Encounter (Signed)
Received second referral for this pt to participate in cardiac rehab from dr. Kipp Brood on 12/3. Pt was contacted by Cardiac rehab support staff and she indicated on 11/22 that she was not interested in participating.  Called to clarify pt intent in regards to cardiac rehab.  Requested call back. Cherre Huger, BSN Cardiac and Training and development officer

## 2020-07-18 ENCOUNTER — Other Ambulatory Visit: Payer: Self-pay | Admitting: Physician Assistant

## 2020-07-21 ENCOUNTER — Telehealth: Payer: Self-pay | Admitting: Cardiovascular Disease

## 2020-07-21 ENCOUNTER — Other Ambulatory Visit: Payer: Self-pay | Admitting: Physician Assistant

## 2020-07-21 MED ORDER — AMIODARONE HCL 200 MG PO TABS: 200.0000 mg | ORAL_TABLET | Freq: Every day | ORAL | 2 refills | Status: DC

## 2020-07-21 MED ORDER — METOPROLOL SUCCINATE ER 50 MG PO TB24: 50.0000 mg | ORAL_TABLET | Freq: Every day | ORAL | 2 refills | Status: DC

## 2020-07-21 MED ORDER — ATORVASTATIN CALCIUM 80 MG PO TABS: 80.0000 mg | ORAL_TABLET | Freq: Every day | ORAL | 2 refills | Status: DC

## 2020-07-21 NOTE — Telephone Encounter (Signed)
Left message for patient  On voicemail for  Medication was e-sent to pharmacy any question may call back.

## 2020-07-21 NOTE — Telephone Encounter (Signed)
*  STAT* If patient is at the pharmacy, call can be transferred to refill team.   1. Which medications need to be refilled? (please list name of each medication and dose if known)  metoprolol succinate (TOPROL-XL) 50 MG 24 hr tablet atorvastatin (LIPITOR) 80 MG tablet amiodarone (PACERONE) 200 MG tablet   2. Which pharmacy/location (including street and city if local pharmacy) is medication to be sent to? [id number] - [place], [place] - [address]  3. Do they need a 30 day or 90 day supply? 30  Patient said her pharmacy would not refill these medications until she saw Dr. Gwenlyn Found. The patient is scheduled to see him 09/12/20 but will run out of medication this week.   Please refill until her upcoming appt

## 2020-08-01 ENCOUNTER — Telehealth (HOSPITAL_COMMUNITY): Payer: Self-pay

## 2020-08-04 ENCOUNTER — Telehealth (HOSPITAL_COMMUNITY): Payer: Self-pay | Admitting: Family Medicine

## 2020-08-07 ENCOUNTER — Ambulatory Visit (HOSPITAL_COMMUNITY): Payer: Medicare Other

## 2020-08-11 ENCOUNTER — Ambulatory Visit (HOSPITAL_COMMUNITY): Payer: Medicare Other

## 2020-08-13 ENCOUNTER — Ambulatory Visit (HOSPITAL_COMMUNITY): Payer: Medicare Other

## 2020-08-15 ENCOUNTER — Ambulatory Visit (HOSPITAL_COMMUNITY): Payer: Medicare Other

## 2020-08-18 ENCOUNTER — Ambulatory Visit (HOSPITAL_COMMUNITY): Payer: Medicare Other

## 2020-08-20 ENCOUNTER — Ambulatory Visit (HOSPITAL_COMMUNITY): Payer: Medicare Other

## 2020-08-22 ENCOUNTER — Ambulatory Visit (HOSPITAL_COMMUNITY): Payer: Medicare Other

## 2020-08-25 ENCOUNTER — Ambulatory Visit (HOSPITAL_COMMUNITY): Payer: Medicare Other

## 2020-08-27 ENCOUNTER — Ambulatory Visit (HOSPITAL_COMMUNITY): Payer: Medicare Other

## 2020-08-29 ENCOUNTER — Ambulatory Visit (HOSPITAL_COMMUNITY): Payer: Medicare Other

## 2020-09-01 ENCOUNTER — Ambulatory Visit (HOSPITAL_COMMUNITY): Payer: Medicare Other

## 2020-09-03 ENCOUNTER — Ambulatory Visit (HOSPITAL_COMMUNITY): Payer: Medicare Other

## 2020-09-05 ENCOUNTER — Ambulatory Visit (HOSPITAL_COMMUNITY): Payer: Medicare Other

## 2020-09-08 ENCOUNTER — Ambulatory Visit (HOSPITAL_COMMUNITY): Payer: Medicare Other

## 2020-09-10 ENCOUNTER — Ambulatory Visit (HOSPITAL_COMMUNITY): Payer: Medicare Other

## 2020-09-12 ENCOUNTER — Encounter: Payer: Self-pay | Admitting: Cardiovascular Disease

## 2020-09-12 ENCOUNTER — Other Ambulatory Visit: Payer: Self-pay

## 2020-09-12 ENCOUNTER — Ambulatory Visit (HOSPITAL_COMMUNITY): Payer: Medicare Other

## 2020-09-12 ENCOUNTER — Ambulatory Visit (INDEPENDENT_AMBULATORY_CARE_PROVIDER_SITE_OTHER): Payer: Medicare Other | Admitting: Cardiovascular Disease

## 2020-09-12 ENCOUNTER — Other Ambulatory Visit: Payer: Self-pay | Admitting: *Deleted

## 2020-09-12 VITALS — BP 141/86 | HR 63 | Ht 62.0 in | Wt 183.2 lb

## 2020-09-12 DIAGNOSIS — I480 Paroxysmal atrial fibrillation: Secondary | ICD-10-CM | POA: Diagnosis not present

## 2020-09-12 DIAGNOSIS — E7800 Pure hypercholesterolemia, unspecified: Secondary | ICD-10-CM | POA: Diagnosis not present

## 2020-09-12 DIAGNOSIS — I10 Essential (primary) hypertension: Secondary | ICD-10-CM

## 2020-09-12 DIAGNOSIS — Z951 Presence of aortocoronary bypass graft: Secondary | ICD-10-CM

## 2020-09-12 MED ORDER — LISINOPRIL 10 MG PO TABS: 10.0000 mg | ORAL_TABLET | Freq: Every day | ORAL | 3 refills | Status: DC

## 2020-09-12 MED ORDER — ATORVASTATIN CALCIUM 80 MG PO TABS: 80.0000 mg | ORAL_TABLET | Freq: Every day | ORAL | 3 refills | Status: DC

## 2020-09-12 NOTE — Patient Instructions (Signed)
Medication Instructions:  STOP amiodarone START lisinopril 10 mg daily  *If you need a refill on your cardiac medications before your next appointment, please call your pharmacy*   Lab Work: Please return for FASTING labs in 3 months (May) (Lipid, Hepatic)  Our in office lab hours are Monday-Friday 8:00-4:00, closed for lunch 12:45-1:45 pm.  No appointment needed.  Follow-Up: At [facility], you and your health needs are our priority.  As part of our continuing mission to provide you with exceptional heart care, we have created designated Provider Care Teams.  These Care Teams include your primary Cardiologist (physician) and Advanced Practice Providers (APPs -  Physician Assistants and Nurse Practitioners) who all work together to provide you with the care you need, when you need it.  We recommend signing up for the patient portal called "MyChart".  Sign up information is provided on this After Visit Summary.  MyChart is used to connect with patients for Virtual Visits (Telemedicine).  Patients are able to view lab/test results, encounter notes, upcoming appointments, etc.  Non-urgent messages can be sent to your provider as well.   To learn more about what you can do with MyChart, go to [email or web address].    Your next appointment:   4 week(s) with pharmacist (BP) --keep blood pressure log for the next 30 days, bring to this appointment as well as your blood pressure cuff  12 months with Dr. Gwenlyn Found

## 2020-09-12 NOTE — Assessment & Plan Note (Signed)
History of PAF maintaining sinus rhythm on amiodarone.  We will discontinue the amiodarone.

## 2020-09-12 NOTE — Progress Notes (Signed)
09/12/2020 Donna Sloan   1941-01-24  [id number]  Primary Physician Aletha Halim., PA-C Primary Cardiologist: Donna Harp MD Donna Sloan, [place]  HPI:  Donna Sloan is a 80 y.o.  moderately overweight, married, Caucasian female mother of 2, grandmother to 2 grandchildren whose husband Donna Sloan  is also a patient of mine and who is accompanying her today. I last saw her  04/10/2019.Donna Sloan She has a history of PAF, improved on low-dose beta blocker. Her other problems include obstructive sleep apnea; she never pursued a CPAP titration study after her initial diagnostic tests. She has hypertension and hyperlipidemia as well. She also has symptoms compatible with restless leg syndrome. Lower extremity Dopplers are normal.  Since I saw her a year and a half ago she was admitted to the hospital with a non-STEMI.  I performed diagnostic coronary angiography on her 05/15/2020 revealing left main/two-vessel disease and a left dominant system.  She underwent CABG x2 by Dr. Kipp Sloan 05/20/2020 with a LIMA to the LAD and a vein to an obtuse marginal branch.  She has done well since.  She did not participate in cardiac rehab.  She denies chest pain or shortness of breath.  She is maintaining sinus rhythm on amiodarone.   Current Meds  Medication Sig  . aspirin EC 325 MG EC tablet Take 1 tablet (325 mg total) by mouth daily.  Donna Sloan atorvastatin (LIPITOR) 80 MG tablet Take 1 tablet (80 mg total) by mouth daily.  Donna Sloan bismuth subsalicylate (PEPTO BISMOL) 262 MG/15ML suspension Take 30 mLs by mouth every 6 (six) hours as needed for indigestion.  . furosemide (LASIX) 40 MG tablet Take 1 tablet (40 mg total) by mouth daily. For 5 days then stop  . lisinopril (ZESTRIL) 10 MG tablet Take 1 tablet (10 mg total) by mouth daily.  . metoprolol succinate (TOPROL-XL) 50 MG 24 hr tablet Take 1 tablet (50 mg total) by mouth daily.  . traMADol (ULTRAM) 50 MG tablet Take 1 tablet (50 mg total) by mouth every 6  (six) hours as needed for moderate pain.  . [DISCONTINUED] amiodarone (PACERONE) 200 MG tablet Take 1 tablet (200 mg total) by mouth daily.  . [DISCONTINUED] potassium chloride SA (KLOR-CON) 20 MEQ tablet Take 1 tablet (20 mEq total) by mouth daily. For 5 days then stop.     Allergies  Allergen Reactions  . Penicillins     Has patient had a PCN reaction causing immediate rash, facial/tongue/throat swelling, SOB or lightheadedness with hypotension: Yes Has patient had a PCN reaction causing severe rash involving mucus membranes or skin necrosis: No Has patient had a PCN reaction that required hospitalization: No Has patient had a PCN reaction occurring within the last 10 years: No If all of the above answers are "NO", then may proceed with Cephalosporin use.  . Rosuvastatin Nausea And Vomiting  . Simvastatin Nausea And Vomiting    Social History   Socioeconomic History  . Marital status: Married    Spouse name: Not on file  . Number of children: Not on file  . Years of education: Not on file  . Highest education level: Not on file  Occupational History  . Not on file  Tobacco Use  . Smoking status: Never Smoker  . Smokeless tobacco: Never Used  Vaping Use  . Vaping Use: Never used  Substance and Sexual Activity  . Alcohol use: No    Alcohol/week: 0.0 standard drinks  . Drug use: No  .  Sexual activity: Not on file  Other Topics Concern  . Not on file  Social History Narrative   Lives with husband.  Has 2 sons.  Retired from [employer] working.  Education: high school.     Social Determinants of Health   Financial Resource Strain: Not on file  Food Insecurity: Not on file  Transportation Needs: Not on file  Physical Activity: Not on file  Stress: Not on file  Social Connections: Not on file  Intimate Partner Violence: Not on file     Review of Systems: General: negative for chills, fever, night sweats or weight changes.  Cardiovascular: negative for chest pain,  dyspnea on exertion, edema, orthopnea, palpitations, paroxysmal nocturnal dyspnea or shortness of breath Dermatological: negative for rash Respiratory: negative for cough or wheezing Urologic: negative for hematuria Abdominal: negative for nausea, vomiting, diarrhea, bright red blood per rectum, melena, or hematemesis Neurologic: negative for visual changes, syncope, or dizziness All other systems reviewed and are otherwise negative except as noted above.    Blood pressure (!) 141/86, pulse 63, height 5\' 2"  (1.575 m), weight 183 lb 3.2 oz (83.1 kg), SpO2 99 %.  General appearance: alert and no distress Neck: no adenopathy, no carotid bruit, no JVD, supple, symmetrical, trachea midline and thyroid not enlarged, symmetric, no tenderness/mass/nodules Lungs: clear to auscultation bilaterally Heart: regular rate and rhythm, S1, S2 normal, no murmur, click, rub or gallop Extremities: extremities normal, atraumatic, no cyanosis or edema Pulses: 2+ and symmetric Skin: Skin color, texture, turgor normal. No rashes or lesions Neurologic: Alert and oriented X 3, normal strength and tone. Normal symmetric reflexes. Normal coordination and gait  EKG sinus rhythm at sixty-three with nonspecific ST and T wave changes.  Personally reviewed this EKG.  ASSESSMENT AND PLAN:   PAF (paroxysmal atrial fibrillation) History of PAF maintaining sinus rhythm on amiodarone.  We will discontinue the amiodarone.  Essential hypertension History of essential hypertension a blood pressure measured today 141/86.  She says she checks her blood pressure at home and is on the high side.  She was on Zestoretic prior to her bypass surgery 20/12.5.  I am going to restart her on lisinopril 10 mg a day and have her keep a blood pressure log for the next 30 days.  She will return after that to review review her readings with a Pharm.D. .  Hyperlipidemia History of hyperlipidemia currently not on statin therapy.  She was on  pravastatin prior to her bypass surgery.  I am going to begin her on atorvastatin 80 mg a day and we will recheck a lipid liver profile in 3 months  S/P CABG x 2 History of CAD status post admission for non-STEMI and cardiac catheterization by myself 05/15/2020 revealing with left main/three-vessel disease and a left dominant system.  She underwent CABG x2 by Dr. Melodie Bouillon 05/20/2020 with a LIMA to LAD and vein to an obtuse marginal branch.  She has done well since.  She denies chest pain or shortness of breath.  Unfortunately, she did not participate in the cardiac rehab program.      Donna Harp MD [facility], [facility] 09/12/2020 2:38 PM

## 2020-09-12 NOTE — Assessment & Plan Note (Signed)
History of essential hypertension a blood pressure measured today 141/86.  She says she checks her blood pressure at home and is on the high side.  She was on Zestoretic prior to her bypass surgery 20/12.5.  I am going to restart her on lisinopril 10 mg a day and have her keep a blood pressure log for the next 30 days.  She will return after that to review review her readings with a Pharm.D. .

## 2020-09-12 NOTE — Assessment & Plan Note (Signed)
History of CAD status post admission for non-STEMI and cardiac catheterization by myself 05/15/2020 revealing with left main/three-vessel disease and a left dominant system.  She underwent CABG x2 by Dr. Melodie Bouillon 05/20/2020 with a LIMA to LAD and vein to an obtuse marginal branch.  She has done well since.  She denies chest pain or shortness of breath.  Unfortunately, she did not participate in the cardiac rehab program.

## 2020-09-12 NOTE — Assessment & Plan Note (Signed)
History of hyperlipidemia currently not on statin therapy.  She was on pravastatin prior to her bypass surgery.  I am going to begin her on atorvastatin 80 mg a day and we will recheck a lipid liver profile in 3 months

## 2020-09-15 ENCOUNTER — Ambulatory Visit (HOSPITAL_COMMUNITY): Payer: Medicare Other

## 2020-09-17 ENCOUNTER — Ambulatory Visit (HOSPITAL_COMMUNITY): Payer: Medicare Other

## 2020-09-19 ENCOUNTER — Ambulatory Visit (HOSPITAL_COMMUNITY): Payer: Medicare Other

## 2020-09-22 ENCOUNTER — Ambulatory Visit (HOSPITAL_COMMUNITY): Payer: Medicare Other

## 2020-09-24 ENCOUNTER — Ambulatory Visit (HOSPITAL_COMMUNITY): Payer: Medicare Other

## 2020-09-26 ENCOUNTER — Ambulatory Visit (HOSPITAL_COMMUNITY): Payer: Medicare Other

## 2020-09-29 ENCOUNTER — Ambulatory Visit (HOSPITAL_COMMUNITY): Payer: Medicare Other

## 2020-10-01 ENCOUNTER — Ambulatory Visit (HOSPITAL_COMMUNITY): Payer: Medicare Other

## 2020-10-03 ENCOUNTER — Ambulatory Visit (HOSPITAL_COMMUNITY): Payer: Medicare Other

## 2020-10-13 ENCOUNTER — Ambulatory Visit (INDEPENDENT_AMBULATORY_CARE_PROVIDER_SITE_OTHER): Payer: Medicare Other | Admitting: Pharmacist

## 2020-10-13 ENCOUNTER — Other Ambulatory Visit: Payer: Self-pay | Admitting: Cardiovascular Disease

## 2020-10-13 ENCOUNTER — Other Ambulatory Visit: Payer: Self-pay

## 2020-10-13 VITALS — BP 146/84 | HR 64 | Resp 14 | Ht 61.5 in | Wt 183.8 lb

## 2020-10-13 DIAGNOSIS — I10 Essential (primary) hypertension: Secondary | ICD-10-CM | POA: Diagnosis not present

## 2020-10-13 MED ORDER — LISINOPRIL 20 MG PO TABS: 20.0000 mg | ORAL_TABLET | Freq: Every day | ORAL | 1 refills | Status: DC

## 2020-10-13 NOTE — Patient Instructions (Addendum)
Return for a follow up appointment in 4 weeks  Check your blood pressure at home daily (if able) and keep record of the readings.  Take your BP meds as follows: *INCRASE lisinopril to 20mg  daily* *STOP taking furosemie*  Bring all of your meds, your BP cuff and your record of home blood pressures to your next appointment.  Exercise as you're able, try to walk approximately 30 minutes per day.  Keep salt intake to a minimum, especially watch canned and prepared boxed foods.  Eat more fresh fruits and vegetables and fewer canned items.  Avoid eating in fast food restaurants.    HOW TO TAKE YOUR BLOOD PRESSURE: . Rest 5 minutes before taking your blood pressure. .  Don't smoke or drink caffeinated beverages for at least 30 minutes before. . Take your blood pressure before (not after) you eat. . Sit comfortably with your back supported and both feet on the floor (don't cross your legs). . Elevate your arm to heart level on a table or a desk. . Use the proper sized cuff. It should fit smoothly and snugly around your bare upper arm. There should be enough room to slip a fingertip under the cuff. The bottom edge of the cuff should be 1 inch above the crease of the elbow. . Ideally, take 3 measurements at one sitting and record the average.

## 2020-10-13 NOTE — Progress Notes (Signed)
Patient ID: Donna Sloan                 DOB: 1940/12/18                      MRN: [id number]     HPI: Donna Sloan is a 80 y.o. female referred by Dr. Gwenlyn Found to HTN clinic. PMH includes NSTEMI, urinary incontinence, atrial fibrillation, OSA, hypertension, restless leg syndrome, and hyperlipidemia. Noted patient stopped taking furosemide and will prefer to avoid diuretic for now. Reports compliance with ]lisinopril and metoprolol as prescribed. Denies chest pain, headaches, increase fatigue, palpitations, or blurry vision.   Current HTN meds:  Furosemide 40mg  daily - d/c Lisinopril 10mg  daily Metoprolol succinate 50mg  daily  BP goal: <130/80  Family History: MI in brother at age 28, MI in father at age 40s, MI in maternal and paternal grandfather, kidney disease in maternal grandmother.  Social History: denies tobacco and alcohol use  Exercise: activities of daily living  Home BP readings:  13 readings, average 135/74, HR range 57-85bpm  Wt Readings from Last 3 Encounters:  10/13/20 183 lb 12.8 oz (83.4 kg)  09/12/20 183 lb 3.2 oz (83.1 kg)  06/12/20 179 lb 6.4 oz (81.4 kg)   BP Readings from Last 3 Encounters:  10/13/20 (!) 146/84  09/12/20 (!) 141/86  06/12/20 (!) 142/80   Pulse Readings from Last 3 Encounters:  10/13/20 64  09/12/20 63  06/12/20 72    Past Medical History:  Diagnosis Date  . Chest pain    2D ECHO, 11/24/2010 - EF >55%, NUCLEAR STRESS TEST, 11/24/2010 - normal, no EKG changes for ischemia  . Claudication ([place])    LEA DUPLEX, 12/15/2010 - normal scan  . Endometrial adenocarcinoma ([place])    Stage I  . GERD (gastroesophageal reflux disease)   . Hyperlipidemia    statin intolerant  . Hypertension   . Urinary incontinence     Current Outpatient Medications on File Prior to Visit  Medication Sig Dispense Refill  . aspirin EC 325 MG EC tablet Take 1 tablet (325 mg total) by mouth daily. 30 tablet 0  . atorvastatin (LIPITOR) 80 MG tablet Take 1  tablet (80 mg total) by mouth daily. 90 tablet 3  . bismuth subsalicylate (PEPTO BISMOL) 262 MG/15ML suspension Take 30 mLs by mouth every 6 (six) hours as needed for indigestion.    . traMADol (ULTRAM) 50 MG tablet Take 1 tablet (50 mg total) by mouth every 6 (six) hours as needed for moderate pain. 40 tablet 0   No current facility-administered medications on file prior to visit.    Allergies  Allergen Reactions  . Penicillins     Has patient had a PCN reaction causing immediate rash, facial/tongue/throat swelling, SOB or lightheadedness with hypotension: Yes Has patient had a PCN reaction causing severe rash involving mucus membranes or skin necrosis: No Has patient had a PCN reaction that required hospitalization: No Has patient had a PCN reaction occurring within the last 10 years: No If all of the above answers are "NO", then may proceed with Cephalosporin use.  . Rosuvastatin Nausea And Vomiting  . Simvastatin Nausea And Vomiting    Blood pressure (!) 146/84, pulse 64, resp. rate 14, height 5' 1.5" (1.562 m), weight 183 lb 12.8 oz (83.4 kg), SpO2 97 %.  Essential hypertension Blood pressure remains above goal during OV and al home. Will increase lisinopril to 20mg  daily and follow up in 4 weeks. Okay  to STOP taking furosemide. Patient was encouraged to decrease sodium in diet, increase physical activity, and monitor BP twice daily.   Azoria Abbett Rodriguez-Guzman PharmD, BCPS, [place] [address] 10/24/2020 2:45 PM

## 2020-10-16 ENCOUNTER — Other Ambulatory Visit: Payer: Self-pay | Admitting: Cardiovascular Disease

## 2020-10-24 ENCOUNTER — Encounter: Payer: Self-pay | Admitting: Pharmacist

## 2020-10-24 NOTE — Assessment & Plan Note (Addendum)
Blood pressure remains above goal during OV and al home. Will increase lisinopril to 20mg  daily and follow up in 4 weeks. Okay to STOP taking furosemide. Patient was encouraged to decrease sodium in diet, increase physical activity, and monitor BP twice daily.

## 2020-11-10 ENCOUNTER — Ambulatory Visit (INDEPENDENT_AMBULATORY_CARE_PROVIDER_SITE_OTHER): Payer: Medicare Other | Admitting: Pharmacist

## 2020-11-10 ENCOUNTER — Other Ambulatory Visit: Payer: Self-pay

## 2020-11-10 VITALS — BP 128/76 | HR 68 | Ht 62.0 in | Wt 177.8 lb

## 2020-11-10 DIAGNOSIS — I10 Essential (primary) hypertension: Secondary | ICD-10-CM | POA: Diagnosis not present

## 2020-11-10 NOTE — Progress Notes (Signed)
Patient ID: Donna Sloan                 DOB: 17-Apr-1941                      MRN: [id number]     HPI: Donna Sloan is a 80 y.o. female referred by Dr. Gwenlyn Sloan to HTN clinic. PMH includes NSTEMI, urinary incontinence, atrial fibrillation, OSA, hypertension, restless leg syndrome, and hyperlipidemia. Noted patient stopped taking furosemide and will prefer to avoid diuretic for now. Reports compliance with lisinopril and metoprolol as prescribed. Denies chest pain, headaches, increase fatigue, palpitations, or blurry vision. Her hand pain was assessed by PCP and had follow up scheduled with orthopedic surgeon.   Current HTN meds:  Lisinopril 20mg  daily  Metoprolol succinate 50mg  daily  BP goal: <130/80  Family History: MI in brother at age 10, MI in father at age 18s, MI in maternal and paternal grandfather, kidney disease in maternal grandmother.  Social History: denies tobacco and alcohol use  Exercise: activities of daily living  Home BP readings:  14 BP readings, average 125/72, HR range 54-73bpm [place] arm cuff accurate when compared with office manual BP measurment  Wt Readings from Last 3 Encounters:  11/10/20 177 lb 12.8 oz (80.6 kg)  10/13/20 183 lb 12.8 oz (83.4 kg)  09/12/20 183 lb 3.2 oz (83.1 kg)   BP Readings from Last 3 Encounters:  11/10/20 128/76  10/13/20 (!) 146/84  09/12/20 (!) 141/86   Pulse Readings from Last 3 Encounters:  11/10/20 68  10/13/20 64  09/12/20 63    Past Medical History:  Diagnosis Date  . Chest pain    2D ECHO, 11/24/2010 - EF >55%, NUCLEAR STRESS TEST, 11/24/2010 - normal, no EKG changes for ischemia  . Claudication ([place])    LEA DUPLEX, 12/15/2010 - normal scan  . Endometrial adenocarcinoma ([place])    Stage I  . GERD (gastroesophageal reflux disease)   . Hyperlipidemia    statin intolerant  . Hypertension   . Urinary incontinence     Current Outpatient Medications on File Prior to Visit  Medication Sig Dispense Refill  . aspirin  EC 325 MG EC tablet Take 1 tablet (325 mg total) by mouth daily. 30 tablet 0  . atorvastatin (LIPITOR) 80 MG tablet Take 1 tablet (80 mg total) by mouth daily. 90 tablet 3  . lisinopril (ZESTRIL) 20 MG tablet TAKE 1 TABLET(20 MG) BY MOUTH DAILY 90 tablet 2  . metoprolol succinate (TOPROL-XL) 50 MG 24 hr tablet TAKE 1 TABLET(50 MG) BY MOUTH DAILY 30 tablet 2  . Multiple Vitamins-Minerals (PRESERVISION AREDS PO) Take 1 capsule by mouth every 12 (twelve) hours.    . bismuth subsalicylate (PEPTO BISMOL) 262 MG/15ML suspension Take 30 mLs by mouth every 6 (six) hours as needed for indigestion. (Patient not taking: Reported on 11/10/2020)    . traMADol (ULTRAM) 50 MG tablet Take 1 tablet (50 mg total) by mouth every 6 (six) hours as needed for moderate pain. (Patient not taking: Reported on 11/10/2020) 40 tablet 0   No current facility-administered medications on file prior to visit.    Allergies  Allergen Reactions  . Penicillins     Has patient had a PCN reaction causing immediate rash, facial/tongue/throat swelling, SOB or lightheadedness with hypotension: Yes Has patient had a PCN reaction causing severe rash involving mucus membranes or skin necrosis: No Has patient had a PCN reaction that required hospitalization: No Has patient had  a PCN reaction occurring within the last 10 years: No If all of the above answers are "NO", then may proceed with Cephalosporin use.  . Rosuvastatin Nausea And Vomiting  . Simvastatin Nausea And Vomiting    Blood pressure 128/76, pulse 68, height 5\' 2"  (1.575 m), weight 177 lb 12.8 oz (80.6 kg).  Essential hypertension Blood pressure well controlled today and at home. Patient denies problems with current therapy and reports compliance.  Will repeat BMET today, continue all medication without changes and follow up as needed.  Donna Sloan PharmD, BCPS, [place] [address] 11/17/2020 12:54  PM

## 2020-11-10 NOTE — Patient Instructions (Addendum)
Return for a  follow up appointment in 4 weeks  Go to the lab in [place] your blood pressure at home daily (if able) and keep record of the readings.  Take your BP meds as follows: *NO MEDICATION*  Bring all of your meds, your BP cuff and your record of home blood pressures to your next appointment.  Exercise as you're able, try to walk approximately 30 minutes per day.  Keep salt intake to a minimum, especially watch canned and prepared boxed foods.  Eat more fresh fruits and vegetables and fewer canned items.  Avoid eating in fast food restaurants.    HOW TO TAKE YOUR BLOOD PRESSURE: . Rest 5 minutes before taking your blood pressure. .  Don't smoke or drink caffeinated beverages for at least 30 minutes before. . Take your blood pressure before (not after) you eat. . Sit comfortably with your back supported and both feet on the floor (don't cross your legs). . Elevate your arm to heart level on a table or a desk. . Use the proper sized cuff. It should fit smoothly and snugly around your bare upper arm. There should be enough room to slip a fingertip under the cuff. The bottom edge of the cuff should be 1 inch above the crease of the elbow. . Ideally, take 3 measurements at one sitting and record the average.

## 2020-11-11 LAB — BASIC METABOLIC PANEL
BUN/Creatinine Ratio: 15 (ref 12–28)
BUN: 17 mg/dL (ref 8–27)
CO2: 21 mmol/L (ref 20–29)
Calcium: 9.9 mg/dL (ref 8.7–10.3)
Chloride: 104 mmol/L (ref 96–106)
Creatinine, Ser: 1.14 mg/dL — ABNORMAL HIGH (ref 0.57–1.00)
Glucose: 98 mg/dL (ref 65–99)
Potassium: 4.3 mmol/L (ref 3.5–5.2)
Sodium: 143 mmol/L (ref 134–144)
eGFR: 49 mL/min/{1.73_m2} — ABNORMAL LOW (ref >59)

## 2020-11-15 LAB — COLOGUARD: COLOGUARD: NEGATIVE

## 2020-11-17 NOTE — Assessment & Plan Note (Signed)
Blood pressure well controlled today and at home. Patient denies problems with current therapy and reports compliance.  Will repeat BMET today, continue all medication without changes and follow up as needed.

## 2021-01-09 ENCOUNTER — Other Ambulatory Visit: Payer: Self-pay | Admitting: Cardiovascular Disease

## 2021-04-09 ENCOUNTER — Other Ambulatory Visit: Payer: Self-pay | Admitting: Cardiovascular Disease

## 2021-08-15 ENCOUNTER — Other Ambulatory Visit: Payer: Self-pay | Admitting: Cardiovascular Disease

## 2021-08-28 IMAGING — DX DG CHEST 1V PORT
1 series · 1 of 1 positions shown · non-contrast
Comparison: 05/19/2020.

CLINICAL DATA: Status post heart surgery.

EXAM:
PORTABLE CHEST 1 VIEW

[chest]
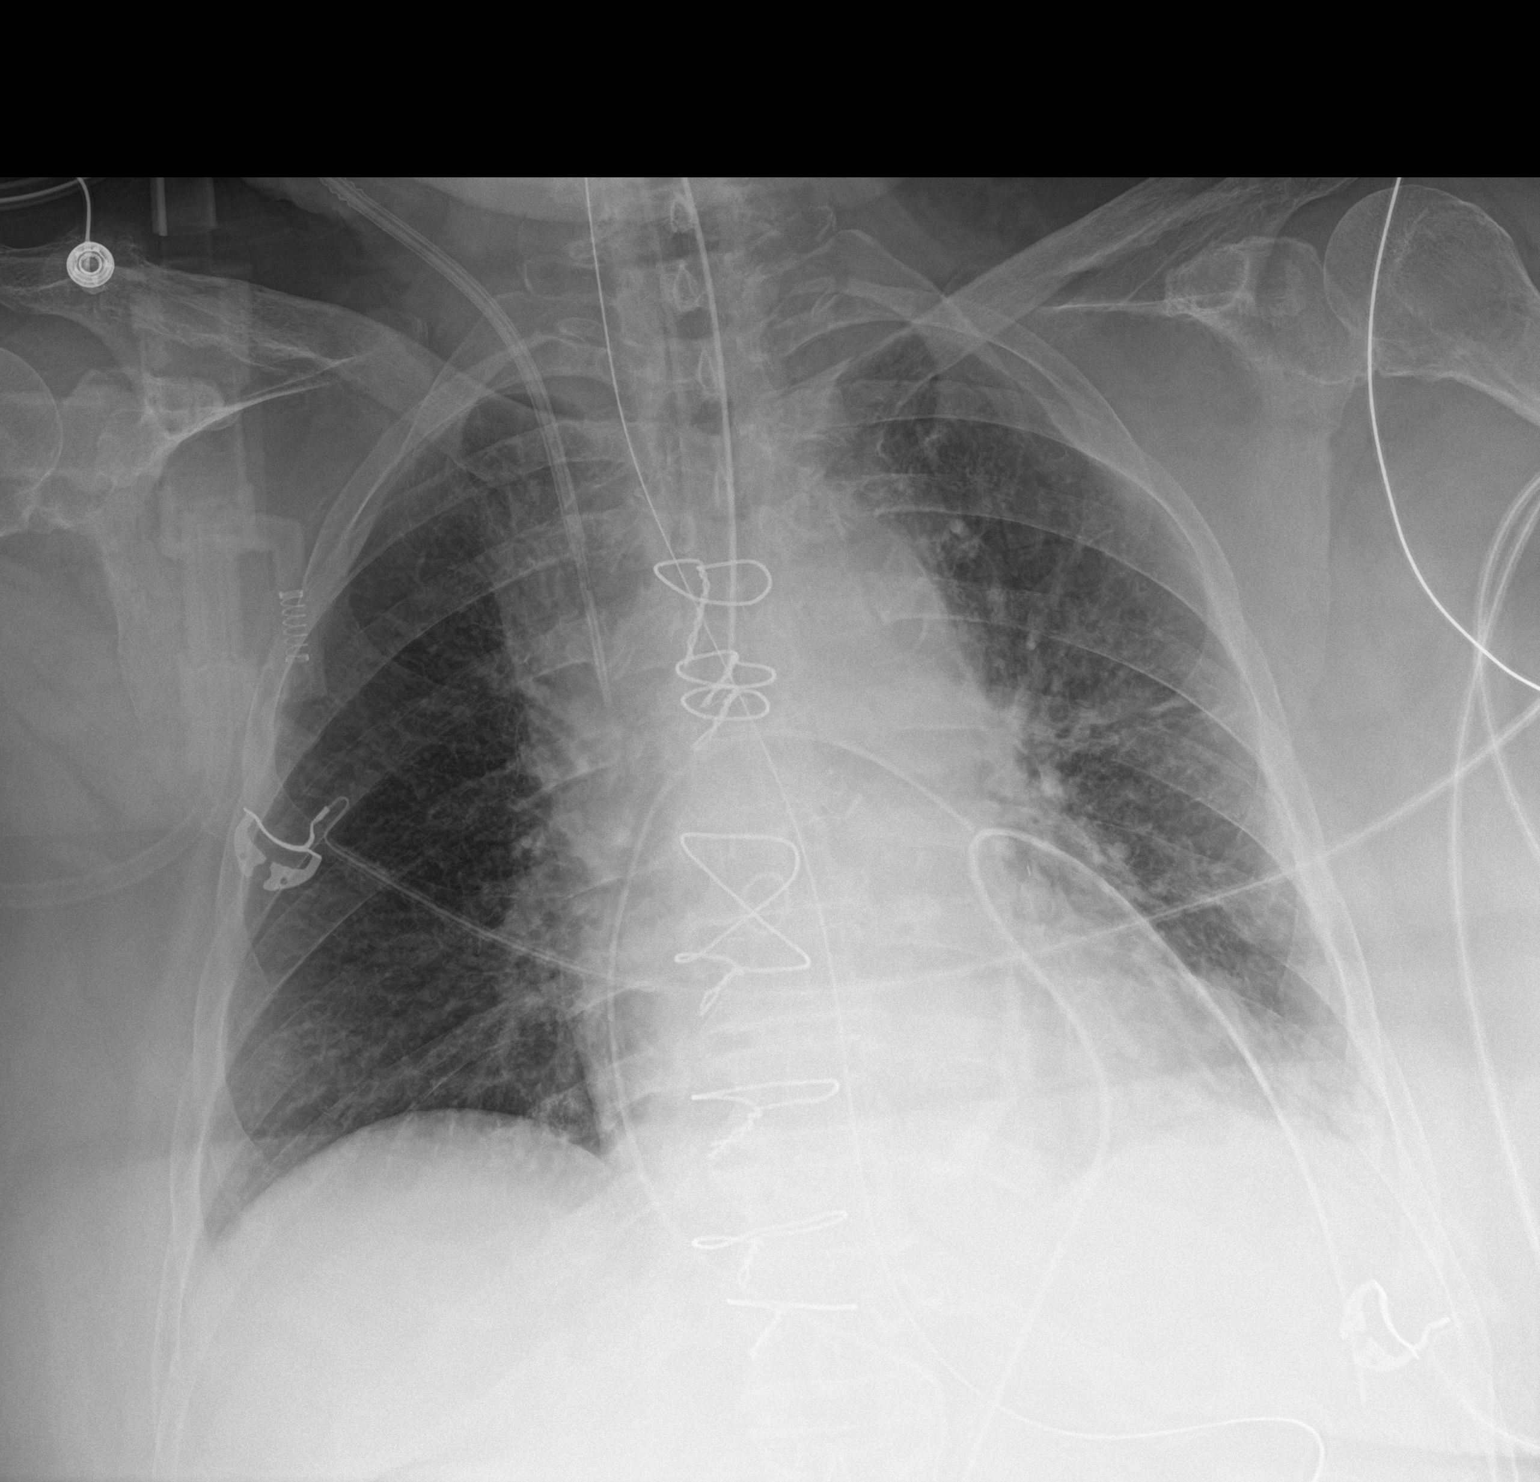

[1 of 1 positions shown; findings below may reference images not displayed]

FINDINGS: Endotracheal tube tip noted at the carina. Proximal repositioning of
approximately 3 cm should be considered. NG tube noted with tip
below left hemidiaphragm. Right IJ line noted with tip over SVC.
Mediastinal drainage catheter noted in good anatomic position. Left
chest tube noted coiled over the lower left chest/upper abdomen,
clinical correlation suggested. Prior CABG. Cardiomegaly. No
pulmonary venous congestion. Mild right upper lung and bibasilar
atelectasis. Tiny left pleural effusion cannot be excluded. No
pneumothorax.
IMPRESSION: 1. Endotracheal tube tip noted at the carina. Proximal repositioning
approximately 3 cm should be considered.
2. Left chest tube noted with tip over the lower left chest/upper
abdomen, clinical correlation suggested. No pneumothorax.
3. NG tube, IJ line, mediastinal drainage catheter in and good
anatomic position.
4.  Prior CABG.  Heart size stable.  No pulmonary venous congestion.
5. Right upper lung and bibasilar atelectasis. Tiny left pleural
effusion cannot be excluded.

These results will be called to the ordering clinician or
representative by the Radiologist Assistant, and communication
documented in the PACS or [REDACTED].

## 2021-08-30 IMAGING — DX DG CHEST 1V PORT
1 series · 1 of 1 positions shown · non-contrast
Comparison: 05/21/2020

CLINICAL DATA: CABG

EXAM:
PORTABLE CHEST 1 VIEW

[chest ap]
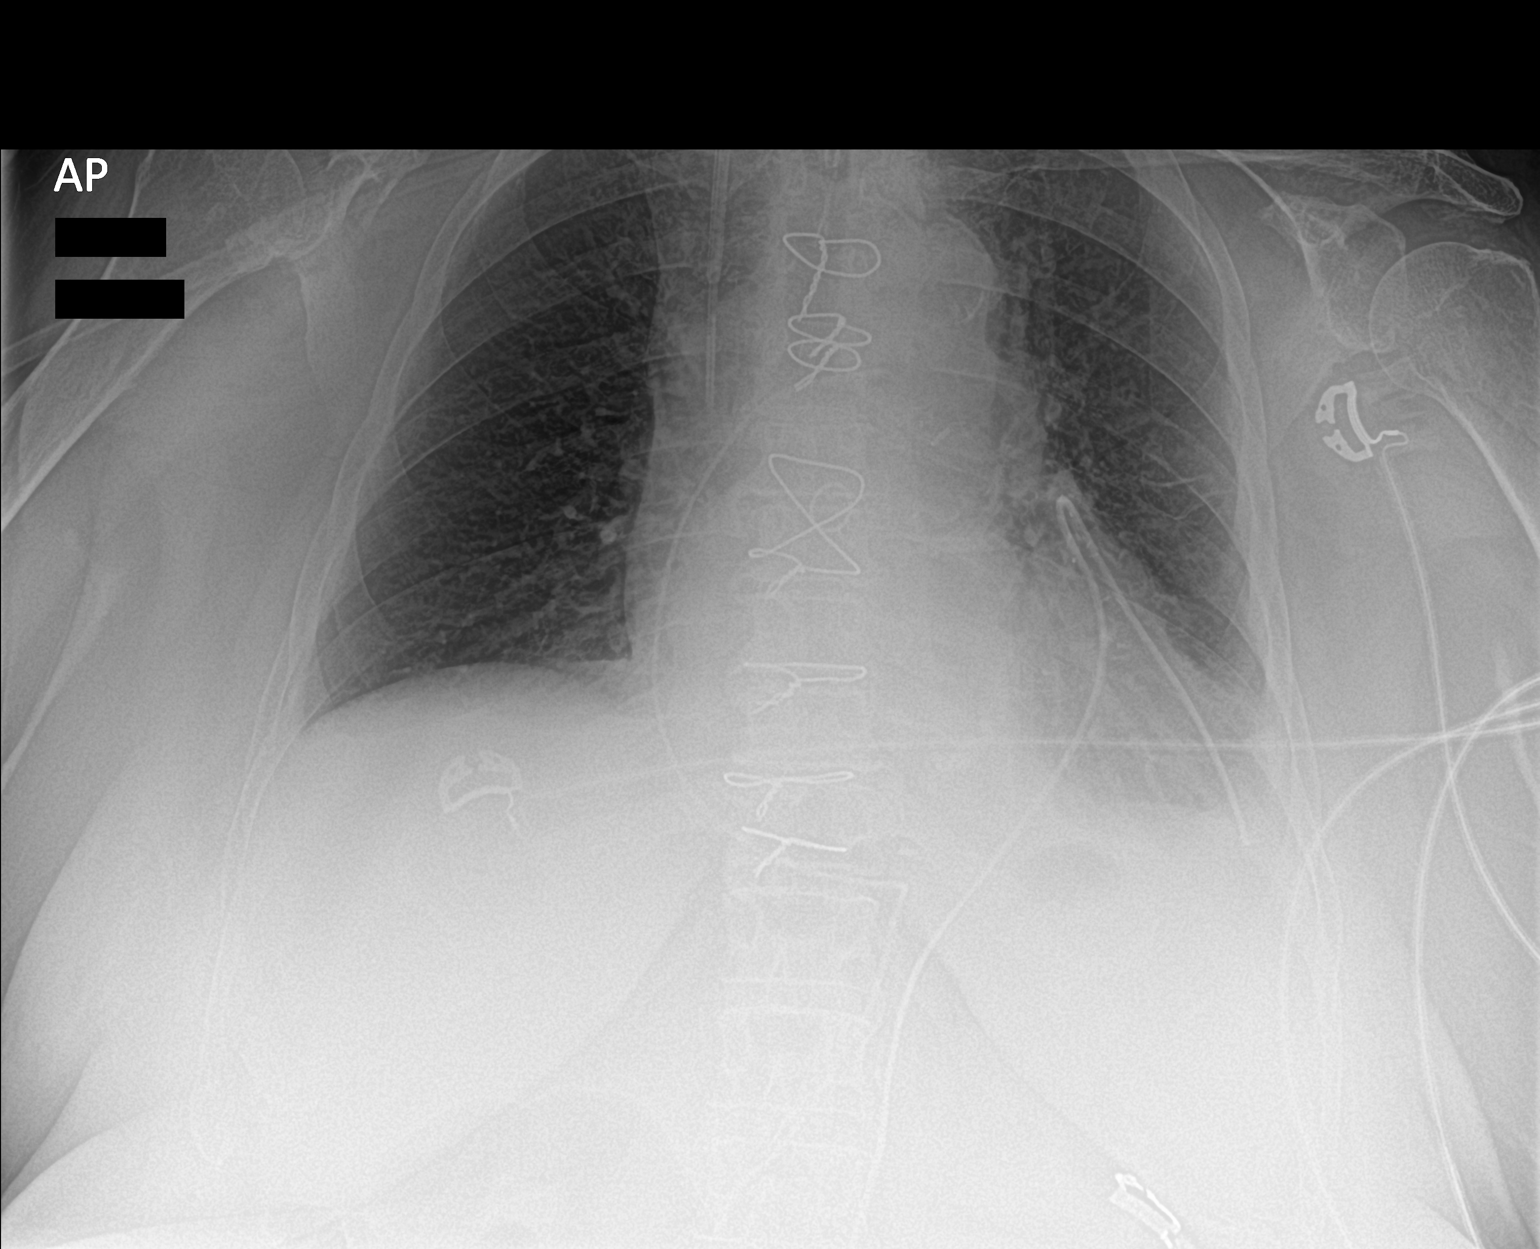

[1 of 1 positions shown; findings below may reference images not displayed]

FINDINGS: The cardiomediastinal silhouette is unchanged in contour.Status post
median sternotomy and CABG. Atherosclerotic calcifications of the
aorta. RIGHT IJ CVC catheter tip terminates over the SVC.
Mediastinal drain and LEFT-sided chest tube. Likely small LEFT
pleural effusion, unchanged. No significant pneumothorax. LEFT
basilar atelectasis. Visualized abdomen is unremarkable. No acute
osseous abnormality.
IMPRESSION: Stable likely small LEFT pleural effusion and LEFT basilar
atelectasis. No significant pneumothorax.

## 2021-09-09 ENCOUNTER — Ambulatory Visit: Payer: Medicare Other | Admitting: Cardiovascular Disease

## 2021-10-12 IMAGING — CR DG CHEST 2V
2 series · 2 of 2 positions shown · non-contrast
Comparison: May 25, 2020

CLINICAL DATA: Status post CABG.

EXAM:
CHEST - 2 VIEW

[w chest pa]
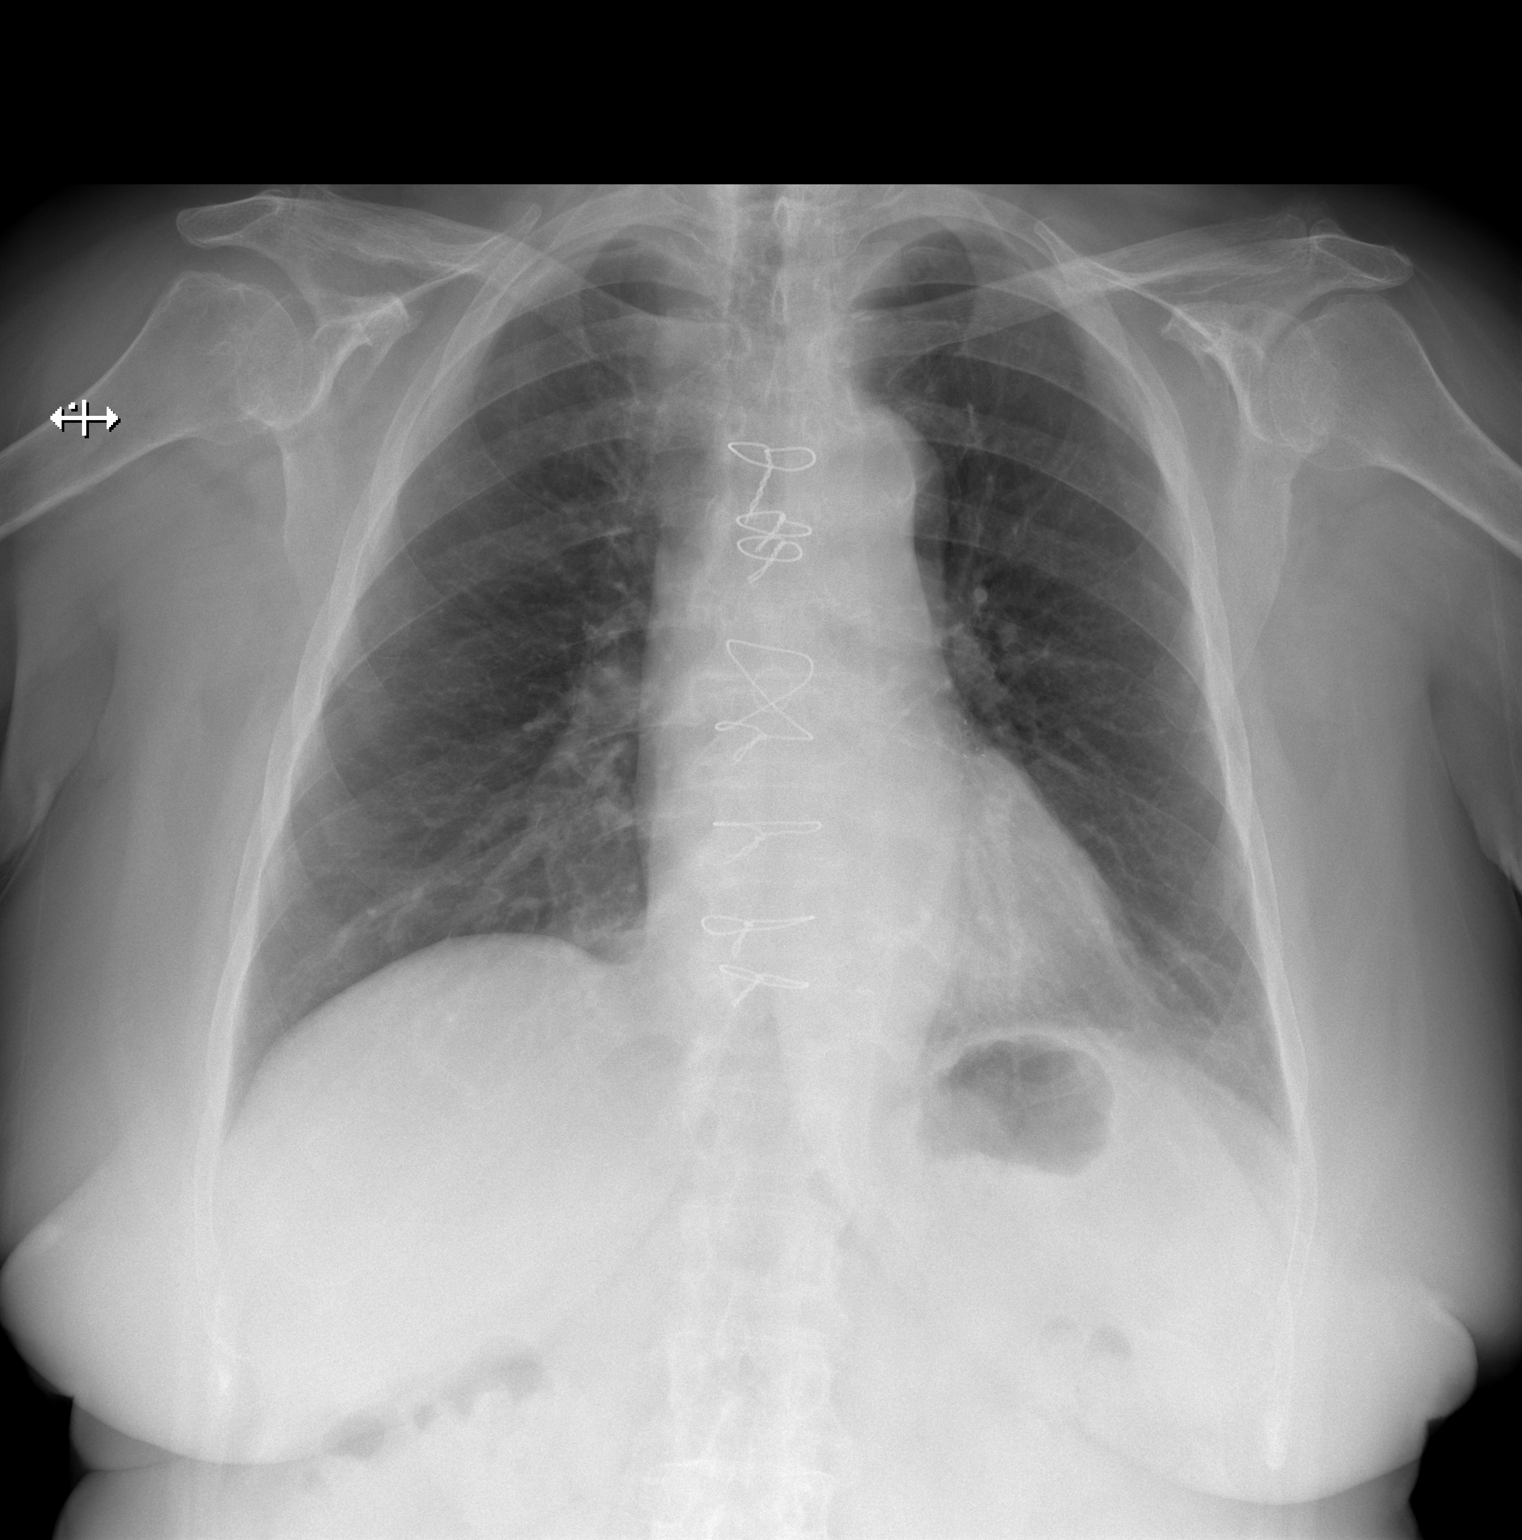

[w chest lat]
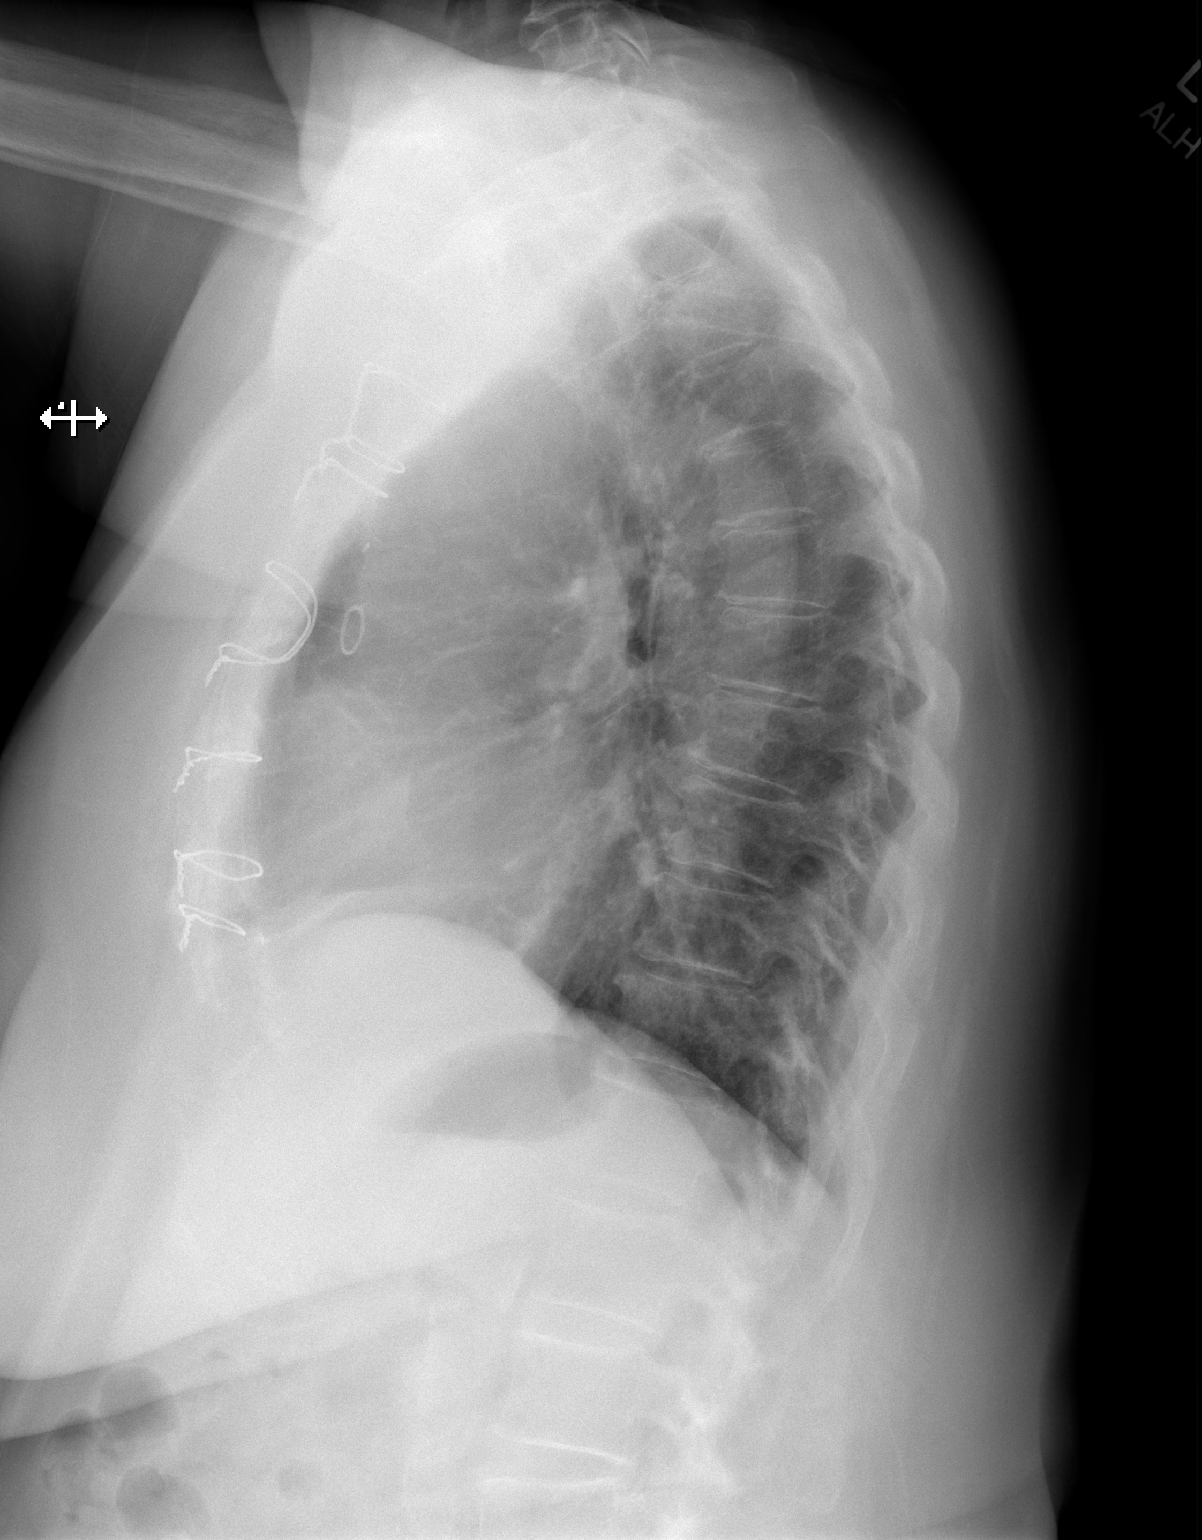

[2 of 2 positions shown; findings below may reference images not displayed]

FINDINGS: Minimal atelectasis in the left base. The heart, hila, mediastinum,
lungs, and pleura are unremarkable. Previous sternotomy again
identified.
IMPRESSION: Minimal atelectasis in the left base.  No other acute abnormalities.

## 2021-11-18 ENCOUNTER — Encounter: Payer: Self-pay | Admitting: Cardiovascular Disease

## 2021-11-18 ENCOUNTER — Ambulatory Visit (INDEPENDENT_AMBULATORY_CARE_PROVIDER_SITE_OTHER): Payer: Medicare Other | Admitting: Cardiovascular Disease

## 2021-11-18 DIAGNOSIS — Z951 Presence of aortocoronary bypass graft: Secondary | ICD-10-CM

## 2021-11-18 DIAGNOSIS — E782 Mixed hyperlipidemia: Secondary | ICD-10-CM

## 2021-11-18 DIAGNOSIS — I10 Essential (primary) hypertension: Secondary | ICD-10-CM | POA: Diagnosis not present

## 2021-11-18 DIAGNOSIS — I480 Paroxysmal atrial fibrillation: Secondary | ICD-10-CM | POA: Diagnosis not present

## 2021-11-18 NOTE — Assessment & Plan Note (Signed)
History of essential hypertension a blood pressure measured today at 116/72.  She is on lisinopril and metoprolol. ?

## 2021-11-18 NOTE — Patient Instructions (Signed)
Medication Instructions:  °Your physician recommends that you continue on your current medications as directed. Please refer to the Current Medication list given to you today. ° °*If you need a refill on your cardiac medications before your next appointment, please call your pharmacy* ° ° °Lab Work: °Your physician recommends that you have labs drawn today: Lipid/liver profile ° °If you have labs (blood work) drawn today and your tests are completely normal, you will receive your results only by: °MyChart Message (if you have MyChart) OR °A paper copy in the mail °If you have any lab test that is abnormal or we need to change your treatment, we will call you to review the results. ° ° ° °Follow-Up: °At [facility], you and your health needs are our priority.  As part of our continuing mission to provide you with exceptional heart care, we have created designated Provider Care Teams.  These Care Teams include your primary Cardiologist (physician) and Advanced Practice Providers (APPs -  Physician Assistants and Nurse Practitioners) who all work together to provide you with the care you need, when you need it. ° °We recommend signing up for the patient portal called "MyChart".  Sign up information is provided on this After Visit Summary.  MyChart is used to connect with patients for Virtual Visits (Telemedicine).  Patients are able to view lab/test results, encounter notes, upcoming appointments, etc.  Non-urgent messages can be sent to your provider as well.   °To learn more about what you can do with MyChart, go to [email or web address].   ° °Your next appointment:   °12 month(s) ° °The format for your next appointment:   °In Person ° °Provider:   °Jonathan Berry, MD °

## 2021-11-18 NOTE — Assessment & Plan Note (Signed)
History of hyperlipidemia on atorvastatin with lipid profile performed 05/15/2020 revealing total cholesterol 208, LDL 136 and HDL of 55.  I am going to repeat a lipid and liver profile. ?

## 2021-11-18 NOTE — Assessment & Plan Note (Signed)
History of PAF post bypass maintaining sinus rhythm.  She was on amiodarone which I discontinued when I saw her last. ?

## 2021-11-18 NOTE — Assessment & Plan Note (Signed)
History of CAD status post non-STEMI 05/15/2020 with card catheterization performed by myself revealing left main/two-vessel disease in the left dominant system.  She underwent CABG x2 by Dr. Kipp Brood 05/20/2020 with a LIMA to her LAD and a vein to an obtuse marginal branch.  She did not participate in cardiac rehab.  She feels clinically improved.  She denies chest pain or shortness of breath. ?

## 2021-11-18 NOTE — Progress Notes (Signed)
? ? ? ?11/18/2021 ?Terald Sleeper   ?1941/05/14  ?[id number] ? ?Primary Physician Aletha Halim., PA-C ?Primary Cardiologist: Lorretta Harp MD [name], [place] ? ?HPI:  Donna Sloan is a 81 y.o.  moderately overweight, married, Caucasian female mother of 2, grandmother to 2 grandchildren whose husband [name]  is also a patient of mine and who is accompanying her today. I last saw her 09/12/2020.[name] She has a history of PAF, improved on low-dose beta blocker. Her other problems include obstructive sleep apnea; she never pursued a CPAP titration study after her initial diagnostic tests. She has hypertension and hyperlipidemia as well. She also has symptoms compatible with restless leg syndrome. Lower extremity Dopplers are normal. ?  ?She  was admitted to the hospital with a non-STEMI.  I performed diagnostic coronary angiography on her 05/15/2020 revealing left main/two-vessel disease and a left dominant system.  She underwent CABG x2 by Dr. Kipp Brood 05/20/2020 with a LIMA to the LAD and a vein to an obtuse marginal branch.  She has done well since.  She did not participate in cardiac rehab. ? ?Since I saw her a year ago she has done well.  I did stop the amiodarone last year because of PAF in the perioperative setting which no longer recurred.  She denies chest pain or shortness of breath. ? ?Current Meds  ?Medication Sig  ? aspirin EC 325 MG EC tablet Take 1 tablet (325 mg total) by mouth daily.  ? atorvastatin (LIPITOR) 80 MG tablet Take 1 tablet (80 mg total) by mouth daily.  ? bismuth subsalicylate (PEPTO BISMOL) 262 MG/15ML suspension Take 30 mLs by mouth every 6 (six) hours as needed for indigestion.  ? lisinopril (ZESTRIL) 20 MG tablet TAKE 1 TABLET(20 MG) BY MOUTH DAILY  ? metoprolol succinate (TOPROL-XL) 50 MG 24 hr tablet TAKE 1 TABLET(50 MG) BY MOUTH DAILY  ? Multiple Vitamins-Minerals (PRESERVISION AREDS PO) Take 1 capsule by mouth every 12 (twelve) hours.  ? traMADol (ULTRAM) 50 MG tablet  Take 1 tablet (50 mg total) by mouth every 6 (six) hours as needed for moderate pain.  ?  ? ?Allergies  ?Allergen Reactions  ? Penicillins   ?  Has patient had a PCN reaction causing immediate rash, facial/tongue/throat swelling, SOB or lightheadedness with hypotension: Yes ?Has patient had a PCN reaction causing severe rash involving mucus membranes or skin necrosis: No ?Has patient had a PCN reaction that required hospitalization: No ?Has patient had a PCN reaction occurring within the last 10 years: No ?If all of the above answers are "NO", then may proceed with Cephalosporin use.  ? Rosuvastatin Nausea And Vomiting  ? Simvastatin Nausea And Vomiting  ? ? ?Social History  ? ?Socioeconomic History  ? Marital status: Married  ?  Spouse name: Not on file  ? Number of children: Not on file  ? Years of education: Not on file  ? Highest education level: Not on file  ?Occupational History  ? Not on file  ?Tobacco Use  ? Smoking status: Never  ? Smokeless tobacco: Never  ?Vaping Use  ? Vaping Use: Never used  ?Substance and Sexual Activity  ? Alcohol use: No  ?  Alcohol/week: 0.0 standard drinks  ? Drug use: No  ? Sexual activity: Not on file  ?Other Topics Concern  ? Not on file  ?Social History Narrative  ? Lives with husband.  Has 2 sons.  Retired from [employer] working.  Education: high school.    ? ?  Social Determinants of Health  ? ?Financial Resource Strain: Not on file  ?Food Insecurity: Not on file  ?Transportation Needs: Not on file  ?Physical Activity: Not on file  ?Stress: Not on file  ?Social Connections: Not on file  ?Intimate Partner Violence: Not on file  ?  ? ?Review of Systems: ?General: negative for chills, fever, night sweats or weight changes.  ?Cardiovascular: negative for chest pain, dyspnea on exertion, edema, orthopnea, palpitations, paroxysmal nocturnal dyspnea or shortness of breath ?Dermatological: negative for rash ?Respiratory: negative for cough or wheezing ?Urologic: negative for  hematuria ?Abdominal: negative for nausea, vomiting, diarrhea, bright red blood per rectum, melena, or hematemesis ?Neurologic: negative for visual changes, syncope, or dizziness ?All other systems reviewed and are otherwise negative except as noted above. ? ? ? ?Blood pressure 116/72, pulse (!) 59, height '5\' 2"'$  (1.575 m), weight 174 lb (78.9 kg).  ?General appearance: alert and no distress ?Neck: no adenopathy, no carotid bruit, no JVD, supple, symmetrical, trachea midline, and thyroid not enlarged, symmetric, no tenderness/mass/nodules ?Lungs: clear to auscultation bilaterally ?Heart: regular rate and rhythm, S1, S2 normal, no murmur, click, rub or gallop ?Extremities: extremities normal, atraumatic, no cyanosis or edema ?Pulses: 2+ and symmetric ?Skin: Skin color, texture, turgor normal. No rashes or lesions ?Neurologic: Grossly normal ? ?EKG sinus bradycardia 59 with nonspecific ST and T wave changes.  I personally reviewed this EKG. ? ?ASSESSMENT AND PLAN:  ? ?PAF (paroxysmal atrial fibrillation) ([place]) ?History of PAF post bypass maintaining sinus rhythm.  She was on amiodarone which I discontinued when I saw her last. ? ?Essential hypertension ?History of essential hypertension a blood pressure measured today at 116/72.  She is on lisinopril and metoprolol. ? ?Hyperlipidemia ?History of hyperlipidemia on atorvastatin with lipid profile performed 05/15/2020 revealing total cholesterol 208, LDL 136 and HDL of 55.  I am going to repeat a lipid and liver profile. ? ?S/P CABG x 2 ?History of CAD status post non-STEMI 05/15/2020 with card catheterization performed by myself revealing left main/two-vessel disease in the left dominant system.  She underwent CABG x2 by Dr. Kipp Brood 05/20/2020 with a LIMA to her LAD and a vein to an obtuse marginal branch.  She did not participate in cardiac rehab.  She feels clinically improved.  She denies chest pain or shortness of breath. ? ? ? ? ?Lorretta Harp MD  FACP,FACC,FAHA, FSCAI ?11/18/2021 ?11:50 AM ?

## 2021-11-19 LAB — HEPATIC FUNCTION PANEL
ALT: 23 IU/L (ref 0–32)
AST: 29 IU/L (ref 0–40)
Albumin: 4 g/dL (ref 3.7–4.7)
Alkaline Phosphatase: 183 IU/L — ABNORMAL HIGH (ref 44–121)
Bilirubin Total: 0.4 mg/dL (ref 0.0–1.2)
Bilirubin, Direct: 0.1 mg/dL (ref 0.00–0.40)
Total Protein: 6.9 g/dL (ref 6.0–8.5)

## 2021-11-19 LAB — LIPID PANEL
Chol/HDL Ratio: 2.7 ratio (ref 0.0–4.4)
Cholesterol, Total: 138 mg/dL (ref 100–199)
HDL: 52 mg/dL (ref >39)
LDL Chol Calc (NIH): 69 mg/dL (ref 0–99)
Triglycerides: 87 mg/dL (ref 0–149)
VLDL Cholesterol Cal: 17 mg/dL (ref 5–40)

## 2021-11-26 ENCOUNTER — Other Ambulatory Visit: Payer: Self-pay

## 2021-11-26 DIAGNOSIS — R748 Abnormal levels of other serum enzymes: Secondary | ICD-10-CM

## 2022-02-19 ENCOUNTER — Other Ambulatory Visit: Payer: Self-pay | Admitting: Cardiovascular Disease

## 2022-03-02 ENCOUNTER — Other Ambulatory Visit: Payer: Self-pay | Admitting: Cardiovascular Disease

## 2022-03-02 LAB — HEPATIC FUNCTION PANEL
ALT: 45 IU/L — ABNORMAL HIGH (ref 0–32)
AST: 50 IU/L — ABNORMAL HIGH (ref 0–40)
Albumin: 4 g/dL (ref 3.8–4.8)
Alkaline Phosphatase: 169 IU/L — ABNORMAL HIGH (ref 44–121)
Bilirubin Total: 0.3 mg/dL (ref 0.0–1.2)
Bilirubin, Direct: <0.1 mg/dL (ref 0.00–0.40)
Total Protein: 7.1 g/dL (ref 6.0–8.5)

## 2022-03-03 ENCOUNTER — Other Ambulatory Visit: Payer: Self-pay

## 2022-03-03 DIAGNOSIS — R7989 Other specified abnormal findings of blood chemistry: Secondary | ICD-10-CM

## 2022-05-11 LAB — HEPATIC FUNCTION PANEL
ALT: 27 IU/L (ref 0–32)
AST: 33 IU/L (ref 0–40)
Albumin: 4.2 g/dL (ref 3.8–4.8)
Alkaline Phosphatase: 147 IU/L — ABNORMAL HIGH (ref 44–121)
Bilirubin Total: 0.3 mg/dL (ref 0.0–1.2)
Bilirubin, Direct: 0.1 mg/dL (ref 0.00–0.40)
Total Protein: 7 g/dL (ref 6.0–8.5)

## 2022-05-11 LAB — LIPID PANEL
Chol/HDL Ratio: 3.5 ratio (ref 0.0–4.4)
Cholesterol, Total: 227 mg/dL — ABNORMAL HIGH (ref 100–199)
HDL: 64 mg/dL (ref >39)
LDL Chol Calc (NIH): 145 mg/dL — ABNORMAL HIGH (ref 0–99)
Triglycerides: 103 mg/dL (ref 0–149)
VLDL Cholesterol Cal: 18 mg/dL (ref 5–40)

## 2022-05-15 ENCOUNTER — Other Ambulatory Visit: Payer: Self-pay | Admitting: Cardiovascular Disease

## 2022-06-30 ENCOUNTER — Ambulatory Visit: Payer: Medicare Other

## 2022-08-03 ENCOUNTER — Ambulatory Visit: Payer: Medicare Other

## 2022-08-03 NOTE — Progress Notes (Deleted)
Patient ID: Donna Sloan                 DOB: [birth year]                    MRN: [id number]      HPI: Donna Sloan is a 82 y.o. female patient referred to lipid clinic by ***. PMH is significant for    Reviewed options for lowering LDL cholesterol, including ezetimibe, PCSK-9 inhibitors, bempedoic acid and inclisiran.  Discussed mechanisms of action, dosing, side effects and potential decreases in LDL cholesterol.  Also reviewed cost information and potential options for patient assistance.  Current Medications:  Intolerances:  Risk Factors:  LDL goal:   Diet:   Exercise:   Family History:   Social History:   Labs: Lipid Panel     Component Value Date/Time   CHOL 227 (H) 05/11/2022 1101   TRIG 103 05/11/2022 1101   HDL 64 05/11/2022 1101   CHOLHDL 3.5 05/11/2022 1101   CHOLHDL 3.8 05/15/2020 0356   VLDL 17 05/15/2020 0356   LDLCALC 145 (H) 05/11/2022 1101   LABVLDL 18 05/11/2022 1101    Past Medical History:  Diagnosis Date   Chest pain    2D ECHO, 11/24/2010 - EF >55%, NUCLEAR STRESS TEST, 11/24/2010 - normal, no EKG changes for ischemia   Claudication ([place])    LEA DUPLEX, 12/15/2010 - normal scan   Endometrial adenocarcinoma (HCC)    Stage I   GERD (gastroesophageal reflux disease)    Hyperlipidemia    statin intolerant   Hypertension    Urinary incontinence     Current Outpatient Medications on File Prior to Visit  Medication Sig Dispense Refill   aspirin EC 325 MG EC tablet Take 1 tablet (325 mg total) by mouth daily. 30 tablet 0   atorvastatin (LIPITOR) 80 MG tablet Take 1 tablet (80 mg total) by mouth daily. 90 tablet 2   bismuth subsalicylate (PEPTO BISMOL) 262 MG/15ML suspension Take 30 mLs by mouth every 6 (six) hours as needed for indigestion.     lisinopril (ZESTRIL) 20 MG tablet TAKE 1 TABLET(20 MG) BY MOUTH DAILY 90 tablet 2   metoprolol succinate (TOPROL-XL) 50 MG 24 hr tablet TAKE 1 TABLET(50 MG) BY MOUTH DAILY 90 tablet 1   Multiple  Vitamins-Minerals (PRESERVISION AREDS PO) Take 1 capsule by mouth every 12 (twelve) hours.     omeprazole (PRILOSEC) 40 MG capsule TAKE 1 CAPSULE BY MOUTH EVERY DAY 90 capsule 3   traMADol (ULTRAM) 50 MG tablet Take 1 tablet (50 mg total) by mouth every 6 (six) hours as needed for moderate pain. 40 tablet 0   No current facility-administered medications on file prior to visit.    Allergies  Allergen Reactions   Penicillins     Has patient had a PCN reaction causing immediate rash, facial/tongue/throat swelling, SOB or lightheadedness with hypotension: Yes Has patient had a PCN reaction causing severe rash involving mucus membranes or skin necrosis: No Has patient had a PCN reaction that required hospitalization: No Has patient had a PCN reaction occurring within the last 10 years: No If all of the above answers are "NO", then may proceed with Cephalosporin use.   Rosuvastatin Nausea And Vomiting   Simvastatin Nausea And Vomiting    Assessment/Plan:  1. Hyperlipidemia -  No problems updated. No problem-specific Assessment & Plan notes found for this encounter.    Thank you,  Cammy Copa, Pharm.[facility] [address] [place], [place] [address]  Phone: [phone]; Fax: [phone]

## 2022-08-30 ENCOUNTER — Ambulatory Visit: Payer: Medicare Other

## 2022-11-11 ENCOUNTER — Other Ambulatory Visit: Payer: Self-pay | Admitting: Cardiovascular Disease

## 2023-02-04 ENCOUNTER — Ambulatory Visit: Payer: Medicare Other | Attending: Cardiovascular Disease | Admitting: Cardiovascular Disease

## 2023-02-04 ENCOUNTER — Encounter: Payer: Self-pay | Admitting: Cardiovascular Disease

## 2023-02-04 VITALS — BP 118/74 | HR 69 | Ht 62.0 in | Wt 165.2 lb

## 2023-02-04 DIAGNOSIS — E782 Mixed hyperlipidemia: Secondary | ICD-10-CM

## 2023-02-04 DIAGNOSIS — I10 Essential (primary) hypertension: Secondary | ICD-10-CM

## 2023-02-04 DIAGNOSIS — Z951 Presence of aortocoronary bypass graft: Secondary | ICD-10-CM | POA: Diagnosis not present

## 2023-02-04 NOTE — Patient Instructions (Signed)
Medication Instructions:  Your physician recommends that you continue on your current medications as directed. Please refer to the Current Medication list given to you today.  *If you need a refill on your cardiac medications before your next appointment, please call your pharmacy*   Lab Work: Your physician recommends that you return for lab work in: the next week or 2 for fasting lipid/liver panel  If you have labs (blood work) drawn today and your tests are completely normal, you will receive your results only by: MyChart Message (if you have MyChart) OR A paper copy in the mail If you have any lab test that is abnormal or we need to change your treatment, we will call you to review the results.    Follow-Up: At [facility], you and your health needs are our priority.  As part of our continuing mission to provide you with exceptional heart care, we have created designated Provider Care Teams.  These Care Teams include your primary Cardiologist (physician) and Advanced Practice Providers (APPs -  Physician Assistants and Nurse Practitioners) who all work together to provide you with the care you need, when you need it.  We recommend signing up for the patient portal called "MyChart".  Sign up information is provided on this After Visit Summary.  MyChart is used to connect with patients for Virtual Visits (Telemedicine).  Patients are able to view lab/test results, encounter notes, upcoming appointments, etc.  Non-urgent messages can be sent to your provider as well.   To learn more about what you can do with MyChart, go to [email or web address].    Your next appointment:   6 month(s)  Provider:   Micah Flesher, PA-C, Marjie Skiff, PA-C, Joni Reining, DNP, ANP, Azalee Course, PA-C, or Bernadene Person, NP       Then, Nanetta Batty, MD will plan to see you again in 12 month(s).

## 2023-02-04 NOTE — Assessment & Plan Note (Signed)
History of hyperlipidemia intolerant to statin therapy with lipid profile performed 10/since 10/23 revealing total cholesterol 227, LDL 145 and HDL of 64.  We will discuss starting a PCSK9.

## 2023-02-04 NOTE — Progress Notes (Signed)
02/04/2023 Donna Sloan   [birth year]  [id number]  Primary Physician Richmond Campbell., PA-C Primary Cardiologist: Runell Gess MD FACP, [place], [place], [place]  HPI:  Donna Sloan is a 82 y.o.   moderately overweight, married, Caucasian female mother of 2, grandmother to 2 grandchildren whose husband Leory Plowman was also a patient of mine and who unfortunately passed away November 21, 2022 from cancer.  They were married for 61 years..I last saw her 09/12/2020.[name] She has a history of PAF, improved on low-dose beta blocker. Her other problems include obstructive sleep apnea; she never pursued a CPAP titration study after her initial diagnostic tests. She has hypertension and hyperlipidemia as well. She also has symptoms compatible with restless leg syndrome. Lower extremity Dopplers are normal.   She  was admitted to the hospital with a non-STEMI.  I performed diagnostic coronary angiography on her 05/15/2020 revealing left main/two-vessel disease and a left dominant system.  She underwent CABG x2 by Dr. Cliffton Asters 05/20/2020 with a LIMA to the LAD and a vein to an obtuse marginal branch.  She has done well since.  She did not participate in cardiac rehab.   Since I saw her a year ago she has done well.  I did stop the amiodarone last year because of PAF in the perioperative setting which no longer recurred.  She denies chest pain or shortness of breath.  She now lives alone after losing her husband Leory Plowman and appears to still be grieving.     Current Meds  Medication Sig   aspirin EC 325 MG EC tablet Take 1 tablet (325 mg total) by mouth daily.   bismuth subsalicylate (PEPTO BISMOL) 262 MG/15ML suspension Take 30 mLs by mouth every 6 (six) hours as needed for indigestion.   lisinopril (ZESTRIL) 20 MG tablet TAKE 1 TABLET(20 MG) BY MOUTH DAILY   metoprolol succinate (TOPROL-XL) 50 MG 24 hr tablet TAKE 1 TABLET(50 MG) BY MOUTH DAILY   omeprazole (PRILOSEC) 40 MG capsule TAKE 1 CAPSULE BY MOUTH EVERY DAY    traMADol (ULTRAM) 50 MG tablet Take 1 tablet (50 mg total) by mouth every 6 (six) hours as needed for moderate pain.     Allergies  Allergen Reactions   Penicillins     Has patient had a PCN reaction causing immediate rash, facial/tongue/throat swelling, SOB or lightheadedness with hypotension: Yes Has patient had a PCN reaction causing severe rash involving mucus membranes or skin necrosis: No Has patient had a PCN reaction that required hospitalization: No Has patient had a PCN reaction occurring within the last 10 years: No If all of the above answers are "NO", then may proceed with Cephalosporin use.   Rosuvastatin Nausea And Vomiting   Simvastatin Nausea And Vomiting    Social History   Socioeconomic History   Marital status: Widowed    Spouse name: Not on file   Number of children: Not on file   Years of education: Not on file   Highest education level: Not on file  Occupational History   Not on file  Tobacco Use   Smoking status: Never   Smokeless tobacco: Never  Vaping Use   Vaping Use: Never used  Substance and Sexual Activity   Alcohol use: No    Alcohol/week: 0.0 standard drinks of alcohol   Drug use: No   Sexual activity: Not on file  Other Topics Concern   Not on file  Social History Narrative   Lives with husband.  Has 2 sons.  Retired from [employer] working.  Education: high school.     Social Determinants of Health   Financial Resource Strain: Not on file  Food Insecurity: Not on file  Transportation Needs: Not on file  Physical Activity: Not on file  Stress: Not on file  Social Connections: Not on file  Intimate Partner Violence: Not on file     Review of Systems: General: negative for chills, fever, night sweats or weight changes.  Cardiovascular: negative for chest pain, dyspnea on exertion, edema, orthopnea, palpitations, paroxysmal nocturnal dyspnea or shortness of breath Dermatological: negative for rash Respiratory: negative for cough or  wheezing Urologic: negative for hematuria Abdominal: negative for nausea, vomiting, diarrhea, bright red blood per rectum, melena, or hematemesis Neurologic: negative for visual changes, syncope, or dizziness All other systems reviewed and are otherwise negative except as noted above.    Blood pressure 118/74, pulse 69, height 5\' 2"  (1.575 m), weight 165 lb 3.2 oz (74.9 kg), SpO2 95 %.  General appearance: alert and no distress Neck: no adenopathy, no carotid bruit, no JVD, supple, symmetrical, trachea midline, and thyroid not enlarged, symmetric, no tenderness/mass/nodules Lungs: clear to auscultation bilaterally Heart: regular rate and rhythm, S1, S2 normal, no murmur, click, rub or gallop Extremities: extremities normal, atraumatic, no cyanosis or edema Pulses: 2+ and symmetric Skin: Skin color, texture, turgor normal. No rashes or lesions Neurologic: Grossly normal  EKG EKG Interpretation Date/Time:  Friday February 04 2023 14:59:08 EDT Ventricular Rate:  69 PR Interval:  138 QRS Duration:  84 QT Interval:  404 QTC Calculation: 432 R Axis:   35  Text Interpretation: Sinus rhythm with marked sinus arrhythmia Nonspecific ST and T wave abnormality When compared with ECG of 21-May-2020 06:41, ST no longer elevated in Lateral leads Confirmed by Nanetta Batty [id number]) on 02/04/2023 3:40:40 PM    ASSESSMENT AND PLAN:   Essential hypertension History of essential hypertension blood pressure measured at 118/74.  She is on lisinopril and Toprol.  Hyperlipidemia History of hyperlipidemia intolerant to statin therapy with lipid profile performed 10/since 10/23 revealing total cholesterol 227, LDL 145 and HDL of 64.  We will discuss starting a PCSK9.  S/P CABG x 2 History of CAD status post non-STEMI with cath plug performed 05/15/2020 revealing left main/two-vessel disease and a left dominant system.  She underwent CABG x 2 by Dr. Cliffton Asters 05/20/2020 LIMA to LAD, and vein to an obtuse  marginal branch.  She has done well since.     Runell Gess MD FACP,FACC,FAHA, [facility] 02/04/2023 3:53 PM

## 2023-02-04 NOTE — Assessment & Plan Note (Signed)
History of CAD status post non-STEMI with cath plug performed 05/15/2020 revealing left main/two-vessel disease and a left dominant system.  She underwent CABG x 2 by Dr. Cliffton Asters 05/20/2020 LIMA to LAD, and vein to an obtuse marginal branch.  She has done well since.

## 2023-02-04 NOTE — Assessment & Plan Note (Signed)
History of essential hypertension blood pressure measured at 118/74.  She is on lisinopril and Toprol.

## 2023-02-07 LAB — LIPID PANEL
Chol/HDL Ratio: 3.7 ratio (ref 0.0–4.4)
Cholesterol, Total: 228 mg/dL — ABNORMAL HIGH (ref 100–199)
HDL: 61 mg/dL (ref >39)
LDL Chol Calc (NIH): 140 mg/dL — ABNORMAL HIGH (ref 0–99)
Triglycerides: 154 mg/dL — ABNORMAL HIGH (ref 0–149)
VLDL Cholesterol Cal: 27 mg/dL (ref 5–40)

## 2023-02-07 LAB — HEPATIC FUNCTION PANEL
ALT: 21 IU/L (ref 0–32)
AST: 28 IU/L (ref 0–40)
Albumin: 4.2 g/dL (ref 3.7–4.7)
Alkaline Phosphatase: 155 IU/L — ABNORMAL HIGH (ref 44–121)
Bilirubin Total: 0.5 mg/dL (ref 0.0–1.2)
Bilirubin, Direct: 0.1 mg/dL (ref 0.00–0.40)
Total Protein: 6.8 g/dL (ref 6.0–8.5)

## 2023-02-09 ENCOUNTER — Telehealth (HOSPITAL_BASED_OUTPATIENT_CLINIC_OR_DEPARTMENT_OTHER): Payer: Medicare Other | Admitting: Pharmacist Clinician (PhC)/ Clinical Pharmacy Specialist

## 2023-02-09 NOTE — Telephone Encounter (Signed)
Could you please do PA for Repatha?  Labs posted this week

## 2023-02-15 ENCOUNTER — Other Ambulatory Visit (HOSPITAL_COMMUNITY): Payer: Self-pay

## 2023-02-15 ENCOUNTER — Telehealth: Payer: Self-pay

## 2023-02-15 NOTE — Telephone Encounter (Signed)
Pharmacy Patient Advocate Encounter  Received notification from [facility] that Prior Authorization for REPATHA has been APPROVED from 08/02/22 to 08/02/23.[name]  PA #/Case ID/Reference #: [id number]

## 2023-02-15 NOTE — Telephone Encounter (Signed)
PA request has been Submitted. New Encounter created for follow up. For additional info see Pharmacy Prior Auth telephone encounter from 02/15/23.

## 2023-02-15 NOTE — Telephone Encounter (Signed)
Pharmacy Patient Advocate Encounter   Received notification from Pt Calls Messages that prior authorization for REPATHA 140 MG/ML INJ is required/requested.   Insurance verification completed.   The patient is insured through [employer] .   Per test claim: PA submitted to [facility] via CoverMyMeds Key/confirmation #/EOC [facility] Status is pending

## 2023-02-21 MED ORDER — REPATHA SURECLICK 140 MG/ML ~~LOC~~ SOAJ: 140.0000 mg | SUBCUTANEOUS | 3 refills | Status: DC

## 2023-02-21 NOTE — Telephone Encounter (Signed)
See other encounter.

## 2023-02-21 NOTE — Addendum Note (Signed)
Addended by: Rosalee Kaufman on: 02/21/2023 07:14 AM   Modules accepted: Orders

## 2023-02-24 ENCOUNTER — Telehealth: Payer: Self-pay | Admitting: Pharmacist Clinician (PhC)/ Clinical Pharmacy Specialist

## 2023-02-24 ENCOUNTER — Other Ambulatory Visit: Payer: Self-pay

## 2023-02-24 DIAGNOSIS — E782 Mixed hyperlipidemia: Secondary | ICD-10-CM

## 2023-02-24 NOTE — Telephone Encounter (Signed)
Repatha is cost prohibitive for patient.  Will do paperwork to get Leqvio, should be available at no charge

## 2023-03-09 ENCOUNTER — Other Ambulatory Visit: Payer: Self-pay | Admitting: Pharmacist Clinician (PhC)/ Clinical Pharmacy Specialist

## 2023-03-10 ENCOUNTER — Telehealth: Payer: Self-pay | Admitting: Pharmacy Technician

## 2023-03-10 NOTE — Telephone Encounter (Signed)
Donna Sloan,  Patient states she would like to remain on Repatha.   She does not want to move forward with Leqvio.  Please advise.  Selena Batten

## 2023-03-10 NOTE — Telephone Encounter (Signed)
[facility] note:  Patient will be scheduled as soon as possible.  Auth Submission: NO AUTH NEEDED Site of care: Site of care: [facility] Payer: MEDICARE A/B & BCBS SUPP Medication & CPT/J Code(s) submitted: Leqvio (Inclisiran) [phone] Route of submission (phone, fax, portal):  Phone # Fax # Auth type: Buy/Bill HB Units/visits requested: X1 Reference number:  Approval from: 03/10/23 to 08/02/23

## 2023-03-16 NOTE — Telephone Encounter (Signed)
Patient had previously noted Repatha was cost prohibitive, now declining Leqvio.  LMOM to clarify.

## 2023-03-18 NOTE — Telephone Encounter (Signed)
Spoke with patient.  She would prefer to use the Leqvio - it appears to be free on her insurance.  She had just purchased a 3 month supply of Repatha when the call came to her about Leqvio.    Will confirm with Selena Batten that Wilber Bihari is no cost, as she has Plan F and has met her $240 oop deductible for 2024.  If confirmed $0 copay, will make arrangement for her to finish up current Repatha supply then switch to [facility]

## 2023-04-26 ENCOUNTER — Ambulatory Visit: Payer: Medicare Other | Admitting: Gastroenterology

## 2023-05-10 ENCOUNTER — Other Ambulatory Visit: Payer: Self-pay | Admitting: Cardiovascular Disease

## 2023-05-12 ENCOUNTER — Encounter: Payer: Self-pay | Admitting: Cardiovascular Disease

## 2023-05-18 ENCOUNTER — Encounter: Payer: Self-pay | Admitting: Cardiovascular Disease

## 2023-05-27 ENCOUNTER — Other Ambulatory Visit: Payer: Self-pay | Admitting: Obstetrics

## 2023-05-27 DIAGNOSIS — R928 Other abnormal and inconclusive findings on diagnostic imaging of breast: Secondary | ICD-10-CM

## 2023-05-28 LAB — LIPID PANEL
Chol/HDL Ratio: 2.4 {ratio} (ref 0.0–4.4)
Cholesterol, Total: 153 mg/dL (ref 100–199)
HDL: 65 mg/dL (ref >39)
LDL Chol Calc (NIH): 70 mg/dL (ref 0–99)
Triglycerides: 101 mg/dL (ref 0–149)
VLDL Cholesterol Cal: 18 mg/dL (ref 5–40)

## 2023-05-28 LAB — HEPATIC FUNCTION PANEL
ALT: 18 [IU]/L (ref 0–32)
AST: 28 [IU]/L (ref 0–40)
Albumin: 4 g/dL (ref 3.7–4.7)
Alkaline Phosphatase: 155 [IU]/L — ABNORMAL HIGH (ref 44–121)
Bilirubin Total: 0.6 mg/dL (ref 0.0–1.2)
Bilirubin, Direct: 0.18 mg/dL (ref 0.00–0.40)
Total Protein: 6.9 g/dL (ref 6.0–8.5)

## 2023-06-01 ENCOUNTER — Ambulatory Visit
Admission: RE | Admit: 2023-06-01 | Discharge: 2023-06-01 | Disposition: A | Discharge status: Home / Self Care | Payer: Medicare Other | Source: Ambulatory Visit | Attending: Obstetrics | Admitting: Obstetrics

## 2023-06-01 ENCOUNTER — Encounter: Payer: Self-pay | Admitting: Cardiovascular Disease

## 2023-06-01 DIAGNOSIS — R928 Other abnormal and inconclusive findings on diagnostic imaging of breast: Secondary | ICD-10-CM

## 2023-06-27 ENCOUNTER — Other Ambulatory Visit: Payer: Self-pay | Admitting: Cardiovascular Disease

## 2023-08-02 NOTE — Progress Notes (Signed)
 Cardiology Office Note:  .   Date:  08/04/2023  ID:  Donna Sloan, DOB 1940/11/02, MRN [id number] PCP: Debrah Josette MOHR PA-C  [facility] Providers Cardiologist:  Dorn Lesches, MD }   }   History of Present Illness: .   Donna Sloan is a 82 y.o. female with a history of PAF improved on low-dose beta-blocker, OSA but not using CPAP, RLS, hypertension and hyperlipidemia.  The patient also has CAD after having admission in 2021 revealing left main and two-vessel disease and a left dominant system.  She underwent CABG x 2 by Dr. Shyrl on 05/20/2020 (LIMA to LAD, SVG to OM).  When last seen by Dr. Lesches she was doing well, continues to grieve the death of her husband.  She is tearful today during this office visit.  She has today without any cardiac complaints.  She does have some musculoskeletal pain in her hips uses a walker for ambulation and is requesting a disability placard for her car.  She is medically compliant.  Labs are followed by her primary care provider.  She prefers to stay on Repatha  as it has been working well for her.  ROS: As above otherwise negative.  Studies Reviewed: SABRA      LHC 05/15/2020 Ost LM to Mid LM lesion is 60% stenosed. Ost Cx lesion is 70% stenosed. Prox LAD to Mid LAD lesion is 80% stenosed. Ost LAD to Prox LAD lesion is 70% stenosed. The left ventricular systolic function is normal. LV end diastolic pressure is normal. The left ventricular ejection fraction is 55-65% by visual estimate.     Physical Exam:   VS:  BP 122/78 (BP Location: Left Arm, Patient Position: Sitting)   Pulse 70   Ht 5' 2 (1.575 m)   Wt 168 lb 12.8 oz (76.6 kg)   SpO2 97%   BMI 30.87 kg/m    Wt Readings from Last 3 Encounters:  08/04/23 168 lb 12.8 oz (76.6 kg)  02/04/23 165 lb 3.2 oz (74.9 kg)  11/18/21 174 lb (78.9 kg)    GEN: Well nourished, well developed in no acute distress NECK: No JVD; No carotid bruits CARDIAC: RRR, soft systolic murmurs,  rubs, gallops RESPIRATORY:  Clear to auscultation without rales, wheezing or rhonchi  ABDOMEN: Soft, non-tender, non-distended EXTREMITIES:  No edema; No deformity   ASSESSMENT AND PLAN: .    1.  Coronary artery disease: Status post two-vessel coronary artery bypass grafting in 2021.  She is doing well from a cardiac standpoint.  I will continue her on her metoprolol  and aspirin  as directed aspirin  (should be 81 mg daily).  Would like for her to have more activity and purposeful exercise to help with cardiovascular health.  2.  Hypercholesterolemia: Goal of LDL less than 70.  She remains on Repatha  every 2 weeks.  She was offered another medication to our pharmacist but prefers to stay with Repatha  as she is getting good results.  Labs are followed by PCP.  Most recent labs were completed on 05/27/2023 with total cholesterol 153, LDL of 70, HDL of 65.  3.  Paroxysmal atrial fibrillation.  She is no longer on anticoagulation therapy but remains on aspirin  81 mg daily.  She denies any palpitations or irregular heart rate.  4.  Hypertension: Excellent control of blood pressure today.  Continue metoprolol  and lisinopril  as directed.  Refills are provided on metoprolol .         Signed, Lamarr HERO. Jerilynn CHOL, ANP, AACC

## 2023-08-04 ENCOUNTER — Ambulatory Visit: Payer: Medicare Other | Attending: Adult Health | Admitting: Adult Health

## 2023-08-04 ENCOUNTER — Encounter: Payer: Self-pay | Admitting: Adult Health

## 2023-08-04 ENCOUNTER — Encounter: Payer: Self-pay | Admitting: Cardiovascular Disease

## 2023-08-04 VITALS — BP 122/78 | HR 70 | Ht 62.0 in | Wt 168.8 lb

## 2023-08-04 DIAGNOSIS — Z951 Presence of aortocoronary bypass graft: Secondary | ICD-10-CM | POA: Diagnosis present

## 2023-08-04 DIAGNOSIS — I480 Paroxysmal atrial fibrillation: Secondary | ICD-10-CM | POA: Diagnosis present

## 2023-08-04 DIAGNOSIS — I10 Essential (primary) hypertension: Secondary | ICD-10-CM | POA: Insufficient documentation

## 2023-08-04 DIAGNOSIS — E7800 Pure hypercholesterolemia, unspecified: Secondary | ICD-10-CM | POA: Insufficient documentation

## 2023-08-04 DIAGNOSIS — I2510 Atherosclerotic heart disease of native coronary artery without angina pectoris: Secondary | ICD-10-CM | POA: Diagnosis not present

## 2023-08-04 NOTE — Patient Instructions (Signed)
 Medication Instructions:  NO CHANGES    Lab Work: NONE    Testing/Procedures: NONE   Follow-Up: At [facility], you and your health needs are our priority.  As part of our continuing mission to provide you with exceptional heart care, we have created designated Provider Care Teams.  These Care Teams include your primary Cardiologist (physician) and Advanced Practice Providers (APPs -  Physician Assistants and Nurse Practitioners) who all work together to provide you with the care you need, when you need it.  We recommend signing up for the patient portal called MyChart.  Sign up information is provided on this After Visit Summary.  MyChart is used to connect with patients for Virtual Visits (Telemedicine).  Patients are able to view lab/test results, encounter notes, upcoming appointments, etc.  Non-urgent messages can be sent to your provider as well.   To learn more about what you can do with MyChart, go to [email or web address].    Your next appointment:   6 month(s)  Provider:   Dorn Lesches, MD

## 2023-08-26 ENCOUNTER — Other Ambulatory Visit: Payer: Self-pay | Admitting: Cardiovascular Disease

## 2023-10-03 ENCOUNTER — Telehealth: Payer: Self-pay | Admitting: Cardiovascular Disease

## 2023-10-03 NOTE — Telephone Encounter (Signed)
 Pt returning call

## 2023-10-03 NOTE — Telephone Encounter (Signed)
 Pt c/o medication issue:  1. Name of Medication: metoprolol succinate (TOPROL-XL) 50 MG 24 hr tablet   2. How are you currently taking this medication (dosage and times per day)?  TAKE 1 TABLET(50 MG) BY MOUTH DAILY       3. Are you having a reaction (difficulty breathing--STAT)? No  4. What is your medication issue? Pt is requesting a callback regarding her wanting to know if she should take another tablet or a half of one since her BP is elevated and she's already taken one for the day. Please advise  Pt c/o BP issue: STAT if pt c/o blurred vision, one-sided weakness or slurred speech  1. What are your last 5 BP readings? 189/82 and then 152/82 today  2. Are you having any other symptoms (ex. Dizziness, headache, blurred vision, passed out)?  Pt stated she has a slight headache  3. What is your BP issue? Pt is concerned about her medication not helping her BP right now being elevated when she already took it for the day. She'd like to be advised on what to do. Please advise

## 2023-10-03 NOTE — Telephone Encounter (Signed)
 Patient identification verified by 2 forms. Shade Flood, RN     Tried calling patient, no answer. LVTCB

## 2023-10-03 NOTE — Telephone Encounter (Signed)
 2nd attempt to call patient. LVMTCB

## 2023-10-04 NOTE — Telephone Encounter (Signed)
 Runell Gess, MD  You5 minutes ago (4:49 PM)    Have her keep a 2-week blood pressure log to back to see an APP or Pharm.D. to review.   Patient identification verified by 2 forms. Marilynn Rail, RN    Called and spoke to patient  Advised patient:   -Keep BP Log   -check Bp in the morning, 1-2 hours after Rx and before bed  Patient scheduled for OV 3/26 at 3:10pm  Reviewed ED warning signs/precautions  Patient verbalized understanding, no questions at this time

## 2023-10-04 NOTE — Telephone Encounter (Signed)
 Patient identification verified by 2 forms. Marilynn Rail, RN    Called and spoke to patient  Patient states:   -Bp is much better today compared to yesterday   -does not check BP daily, started checking it Sunday, noticed it was high   -checked BP Sunday because she saw Machine, was not having symptoms   -BP remained elevated call day yesterday, went as high as 190   -Unsure if Dr. Allyson Sabal wants to make changes to medications  -Takes Metoprolol 50mg  daily and Lisinopril 20mg  daily   -BP this morning 120-140/70-80   -out right now, does not have BP log with her   -does not know how often BP becomes elevated Patient denies:  -chest pain/pressure  -SOB/difficulty breathing  -visual changes/disturbances   -new numbness, tingling, weakness  Advised patient:   -can check BP in the morning and 1-2 hours after medication   -keep BP log   -check BP on empty bladder, seated, not talking  Informed patient message sent to Dr. Allyson Sabal for input Reviewed ED warning signs/precautions  Patient verbalized understanding, no questions at this time

## 2023-10-19 NOTE — Progress Notes (Signed)
 Cardiology Office Note:    Date:  10/26/2023   ID:  Donna Sloan, Donna Sloan 1940-10-19, MRN [id number]  PCP:  Richmond Campbell PA-C    [facility] Providers Cardiologist:  Nanetta Batty, MD     Referring MD: Richmond Campbell., PA-C   Chief Complaint  Patient presents with   Follow-up    HTN    History of Present Illness:    Donna Sloan is a 83 y.o. female with a hx of PAF on low-dose beta-blocker, OSA not on CPAP, RLS, hypertension, and hyperlipidemia.  She has CAD with left main disease and is s/p CABG x 2 on 05/20/2020 with LIMA-LAD, SVG-OM.  Echocardiogram prior to CABG showed an LVEF 60-65%, grade 1 DD, normal PASP, severe MAC, and aortic valve sclerosis without stenosis.  Postop course was significant for paroxysmal atrial fibrillation for which she was treated with amiodarone and Lopressor.  Amiodarone later discontinued for lack of recurrence.  She was not anticoagulated.  Hyperlipidemia managed with Repatha.  She was seen January 2025 and was doing well at that time.  She was placed on my schedule today for concerns about blood pressure. She brings a BP log with BP before and 2 hrs after medications. I only see one value that is elevated - typically running 110-120 range systolic. I suspect her outstanding value may have been due to dietary indiscretion vs emotional lability as she is still mourning her husband's death.    Past Medical History:  Diagnosis Date   Chest pain    2D ECHO, 11/24/2010 - EF >55%, NUCLEAR STRESS TEST, 11/24/2010 - normal, no EKG changes for ischemia   Claudication (HCC)    LEA DUPLEX, 12/15/2010 - normal scan   Endometrial adenocarcinoma (HCC)    Stage I   GERD (gastroesophageal reflux disease)    Hyperlipidemia    statin intolerant   Hypertension    Urinary incontinence     Past Surgical History:  Procedure Laterality Date   BLADDER SUSPENSION     CARPAL TUNNEL RELEASE     CORONARY ARTERY BYPASS GRAFT N/A 05/20/2020    Procedure: CORONARY ARTERY BYPASS GRAFTING (CABG) TIMES TWO USING LEFT INTERNAL MAMMARY ARTERY AND RIGHT GREATER SAPHENOUS VEIN HARVESTED ENDOSCOPICALLY;  Surgeon: Corliss Skains, MD;  Location: [facility] OR;  Service: Open Heart Surgery;  Laterality: N/A;   LAPAROSCOPIC ASSISTED VAGINAL HYSTERECTOMY  12/2006   BSO with anterior colporrhaphy   LEFT HEART CATH AND CORONARY ANGIOGRAPHY N/A 05/15/2020   Procedure: LEFT HEART CATH AND CORONARY ANGIOGRAPHY;  Surgeon: Runell Gess, MD;  Location: [facility] INVASIVE CV LAB;  Service: Cardiovascular;  Laterality: N/A;   TEE WITHOUT CARDIOVERSION N/A 05/20/2020   Procedure: TRANSESOPHAGEAL ECHOCARDIOGRAM (TEE);  Surgeon: Corliss Skains, MD;  Location: [facility] OR;  Service: Open Heart Surgery;  Laterality: N/A;    Current Medications: Current Meds  Medication Sig   aspirin EC 81 MG tablet Take 81 mg by mouth daily.   bismuth subsalicylate (PEPTO BISMOL) 262 MG/15ML suspension Take 30 mLs by mouth every 6 (six) hours as needed for indigestion.   lisinopril (ZESTRIL) 20 MG tablet TAKE 1 TABLET(20 MG) BY MOUTH DAILY   metoprolol succinate (TOPROL-XL) 50 MG 24 hr tablet TAKE 1 TABLET(50 MG) BY MOUTH DAILY   omeprazole (PRILOSEC) 40 MG capsule TAKE 1 CAPSULE BY MOUTH EVERY DAY   REPATHA SURECLICK 140 MG/ML SOAJ Inject 140 mg as directed every 14 (fourteen) days.     Allergies:   Penicillins, Rosuvastatin, and  Simvastatin   Social History   Socioeconomic History   Marital status: Widowed    Spouse name: Not on file   Number of children: Not on file   Years of education: Not on file   Highest education level: Not on file  Occupational History   Not on file  Tobacco Use   Smoking status: Never   Smokeless tobacco: Never  Vaping Use   Vaping status: Never Used  Substance and Sexual Activity   Alcohol use: No    Alcohol/week: 0.0 standard drinks of alcohol   Drug use: No   Sexual activity: Not on file  Other Topics Concern   Not on file  Social  History Narrative   Lives with husband.  Has 2 sons.  Retired from [employer] working.  Education: high school.     Social Drivers of Corporate investment banker Strain: Not on file  Food Insecurity: Not on file  Transportation Needs: Not on file  Physical Activity: Not on file  Stress: Not on file  Social Connections: Not on file     Family History: The patient's family history includes Heart attack in her maternal grandfather and paternal grandfather; Heart attack (age of onset: 77) in her brother and father; Kidney disease in her maternal grandmother.  ROS:   Please see the history of present illness.     All other systems reviewed and are negative.  EKGs/Labs/Other Studies Reviewed:    The following studies were reviewed today:       Recent Labs: 05/27/2023: ALT 18  Recent Lipid Panel    Component Value Date/Time   CHOL 153 05/27/2023 1410   TRIG 101 05/27/2023 1410   HDL 65 05/27/2023 1410   CHOLHDL 2.4 05/27/2023 1410   CHOLHDL 3.8 05/15/2020 0356   VLDL 17 05/15/2020 0356   LDLCALC 70 05/27/2023 1410     Risk Assessment/Calculations:                Physical Exam:    VS:  BP 110/78   Pulse 65   Ht 5\' 2"  (1.575 m)   Wt 168 lb 3.2 oz (76.3 kg)   SpO2 98%   BMI 30.76 kg/m     Wt Readings from Last 3 Encounters:  10/26/23 168 lb 3.2 oz (76.3 kg)  08/04/23 168 lb 12.8 oz (76.6 kg)  02/04/23 165 lb 3.2 oz (74.9 kg)     GEN: Well nourished, well developed in no acute distress HEENT: Normal NECK: No JVD; No carotid bruits LYMPHATICS: No lymphadenopathy CARDIAC: RRR, no murmurs, rubs, gallops RESPIRATORY:  Clear to auscultation without rales, wheezing or rhonchi  ABDOMEN: Soft, non-tender, non-distended MUSCULOSKELETAL:  No edema; No deformity  SKIN: Warm and dry NEUROLOGIC:  Alert and oriented x 3 PSYCHIATRIC:  Normal affect   ASSESSMENT:    1. Essential hypertension   2. S/P CABG x 2   3. Coronary artery disease involving native coronary  artery of native heart without angina pectoris   4. Hyperlipidemia with target LDL less than 70    PLAN:    In order of problems listed above:  CAD s/p CABG x 2 Hyperlipidemia with LDL goal less than 70 - In 2021: LIMA-LAD, SVG-OM  -Continue 81 mg aspirin, 50 mg Toprol, 20 mg lisinopril, Repatha - side effects to repatha? - starting inclisirin   Hypertension -Controlled on 20 mg lisinopril, and 50 mg Toprol -- I instructed an additonal 10 mg lisinopril should she have another day of SBP  in the 190 range   Postop atrial fibrillation - No longer on amiodarone, continue 50 mg Toprol - Not anticoagulated - No recurrence        Follow up in 6 months.   Medication Adjustments/Labs and Tests Ordered: Current medicines are reviewed at length with the patient today.  Concerns regarding medicines are outlined above.  No orders of the defined types were placed in this encounter.  No orders of the defined types were placed in this encounter.   Patient Instructions  Medication Instructions:  If your systolic Blood pressure (top number) goes above 170, you can take an extra 1/2 dose of Lisinopril.   Your physician recommends that you continue on your current medications as directed. Please refer to the Current Medication list given to you today.  *If you need a refill on your cardiac medications before your next appointment, please call your pharmacy*   Lab Work: NONE ordered at this time of appointment   Testing/Procedures: NONE ordered at this time of appointment   Follow-Up: At [facility], you and your health needs are our priority.  As part of our continuing mission to provide you with exceptional heart care, we have created designated Provider Care Teams.  These Care Teams include your primary Cardiologist (physician) and Advanced Practice Providers (APPs -  Physician Assistants and Nurse Practitioners) who all work together to provide you with the care you need,  when you need it.  We recommend signing up for the patient portal called "MyChart".  Sign up information is provided on this After Visit Summary.  MyChart is used to connect with patients for Virtual Visits (Telemedicine).  Patients are able to view lab/test results, encounter notes, upcoming appointments, etc.  Non-urgent messages can be sent to your provider as well.   To learn more about what you can do with MyChart, go to [email or web address].    Your next appointment:   July 2025   Provider:   Nanetta Batty, MD     Other Instructions Continue to take your blood pressure Monday, Wednesday and Fridays.    1st Floor: - Lobby - Registration  - Pharmacy  - Lab - Cafe  2nd Floor: - PV Lab - Diagnostic Testing (echo, CT, nuclear med)  3rd Floor: - Vacant  4th Floor: - TCTS (cardiothoracic surgery) - AFib Clinic - Structural Heart Clinic - Vascular Surgery  - Vascular Ultrasound  5th Floor: - HeartCare Cardiology (general and EP) - Clinical Pharmacy for coumadin, hypertension, lipid, weight-loss medications, and med management appointments    Valet parking services will be available as well.         Signed, Marcelino Duster, [place]  10/26/2023 5:07 PM    [facility]

## 2023-10-26 ENCOUNTER — Ambulatory Visit: Attending: Physician Assistant | Admitting: Physician Assistant

## 2023-10-26 ENCOUNTER — Encounter: Payer: Self-pay | Admitting: Physician Assistant

## 2023-10-26 ENCOUNTER — Telehealth: Payer: Self-pay | Admitting: Pharmacist

## 2023-10-26 VITALS — BP 110/78 | HR 65 | Ht 62.0 in | Wt 168.2 lb

## 2023-10-26 DIAGNOSIS — E785 Hyperlipidemia, unspecified: Secondary | ICD-10-CM | POA: Diagnosis present

## 2023-10-26 DIAGNOSIS — I10 Essential (primary) hypertension: Secondary | ICD-10-CM | POA: Diagnosis not present

## 2023-10-26 DIAGNOSIS — Z951 Presence of aortocoronary bypass graft: Secondary | ICD-10-CM | POA: Diagnosis present

## 2023-10-26 DIAGNOSIS — I2510 Atherosclerotic heart disease of native coronary artery without angina pectoris: Secondary | ICD-10-CM | POA: Insufficient documentation

## 2023-10-26 NOTE — Patient Instructions (Signed)
 Medication Instructions:  If your systolic Blood pressure (top number) goes above 170, you can take an extra 1/2 dose of Lisinopril.   Your physician recommends that you continue on your current medications as directed. Please refer to the Current Medication list given to you today.  *If you need a refill on your cardiac medications before your next appointment, please call your pharmacy*   Lab Work: NONE ordered at this time of appointment   Testing/Procedures: NONE ordered at this time of appointment   Follow-Up: At [facility], you and your health needs are our priority.  As part of our continuing mission to provide you with exceptional heart care, we have created designated Provider Care Teams.  These Care Teams include your primary Cardiologist (physician) and Advanced Practice Providers (APPs -  Physician Assistants and Nurse Practitioners) who all work together to provide you with the care you need, when you need it.  We recommend signing up for the patient portal called "MyChart".  Sign up information is provided on this After Visit Summary.  MyChart is used to connect with patients for Virtual Visits (Telemedicine).  Patients are able to view lab/test results, encounter notes, upcoming appointments, etc.  Non-urgent messages can be sent to your provider as well.   To learn more about what you can do with MyChart, go to [email or web address].    Your next appointment:   July 2025   Provider:   Nanetta Batty, MD     Other Instructions Continue to take your blood pressure Monday, Wednesday and Fridays.    1st Floor: - Lobby - Registration  - Pharmacy  - Lab - Cafe  2nd Floor: - PV Lab - Diagnostic Testing (echo, CT, nuclear med)  3rd Floor: - Vacant  4th Floor: - TCTS (cardiothoracic surgery) - AFib Clinic - Structural Heart Clinic - Vascular Surgery  - Vascular Ultrasound  5th Floor: - HeartCare Cardiology (general and EP) - Clinical Pharmacy  for coumadin, hypertension, lipid, weight-loss medications, and med management appointments    Valet parking services will be available as well.

## 2023-10-26 NOTE — Telephone Encounter (Signed)
Leqvio enrollment form faxed to service center 

## 2023-11-01 ENCOUNTER — Telehealth: Payer: Self-pay

## 2023-11-01 ENCOUNTER — Encounter: Payer: Self-pay | Admitting: Cardiovascular Disease

## 2023-11-01 NOTE — Telephone Encounter (Signed)
 Dr. Allyson Sabal and Cristal Deer, patient will be scheduled as soon as possible.  Auth Submission: NO AUTH NEEDED Site of care: Site of care: [facility] Payer: Medicare A/B and BCBS supplement Medication & CPT/J Code(s) submitted: Leqvio (Inclisiran) J1306 Route of submission (phone, fax, portal):  Phone # Fax # Auth type: Buy/Bill PB Units/visits requested: 284mg  x 3 doses Reference number:  Approval from: 11/01/23 to 09/01/24

## 2023-11-01 NOTE — Telephone Encounter (Signed)
 Leqvo statement of benefits back and scanned into media tab. Placing orders for [employer]

## 2023-11-01 NOTE — Addendum Note (Signed)
 Addended by: Cheree Ditto on: 11/01/2023 11:15 AM   Modules accepted: Orders

## 2023-11-02 ENCOUNTER — Ambulatory Visit (INDEPENDENT_AMBULATORY_CARE_PROVIDER_SITE_OTHER)

## 2023-11-02 VITALS — BP 132/74 | HR 65 | Temp 98.2°F | Resp 16 | Ht 62.0 in | Wt 169.8 lb

## 2023-11-02 DIAGNOSIS — I200 Unstable angina: Secondary | ICD-10-CM | POA: Diagnosis not present

## 2023-11-02 DIAGNOSIS — Z951 Presence of aortocoronary bypass graft: Secondary | ICD-10-CM

## 2023-11-02 DIAGNOSIS — I214 Non-ST elevation (NSTEMI) myocardial infarction: Secondary | ICD-10-CM | POA: Diagnosis not present

## 2023-11-02 DIAGNOSIS — E782 Mixed hyperlipidemia: Secondary | ICD-10-CM

## 2023-11-02 MED ORDER — INCLISIRAN SODIUM 284 MG/1.5ML ~~LOC~~ SOSY
284.0000 mg | PREFILLED_SYRINGE | Freq: Once | SUBCUTANEOUS | Status: AC
  Administered 2023-11-02: 284 mg via SUBCUTANEOUS
  Filled 2023-11-02: qty 1.5

## 2023-11-02 NOTE — Progress Notes (Signed)
 Diagnosis: Hyperlipidemia  Provider:  Chilton Greathouse MD  Procedure: Injection  Leqvio (inclisiran), Dose: 284 mg, Site: subcutaneous, Number of injections: 1  Injection Site(s): Left upper quad. abdomen  Post Care: Observation period completed  Discharge: Condition: Good, Destination: Home . AVS Provided  Performed by:  Forrest Moron, RN

## 2023-11-02 NOTE — Patient Instructions (Signed)
 Inclisiran Injection What is this medication? INCLISIRAN (in kli SIR an) treats high cholesterol. It works by decreasing bad cholesterol (such as LDL) in your blood. Changes to diet and exercise are often combined with this medication. This medicine may be used for other purposes; ask your health care provider or pharmacist if you have questions. COMMON BRAND NAME(S): LEQVIO What should I tell my care team before I take this medication? They need to know if you have any of these conditions: An unusual or allergic reaction to inclisiran, other medications, foods, dyes, or preservatives Pregnant or trying to get pregnant Breast-feeding How should I use this medication? This medication is injected under the skin. It is given by your care team in a hospital or clinic setting. Talk to your care team about the use of this medication in children. Special care may be needed. Overdosage: If you think you have taken too much of this medicine contact a poison control center or emergency room at once. NOTE: This medicine is only for you. Do not share this medicine with others. What if I miss a dose? Keep appointments for follow-up doses. It is important not to miss your dose. Call your care team if you are unable to keep an appointment. What may interact with this medication? Interactions are not expected. This list may not describe all possible interactions. Give your health care provider a list of all the medicines, herbs, non-prescription drugs, or dietary supplements you use. Also tell them if you smoke, drink alcohol, or use illegal drugs. Some items may interact with your medicine. What should I watch for while using this medication? Visit your care team for regular checks on your progress. Tell your care team if your symptoms do not start to get better or if they get worse. You may need blood work while you are taking this medication. What side effects may I notice from receiving this  medication? Side effects that you should report to your care team as soon as possible: Allergic reactions--skin rash, itching, hives, swelling of the face, lips, tongue, or throat Side effects that usually do not require medical attention (report these to your care team if they continue or are bothersome): Joint pain Pain, redness, or irritation at injection site This list may not describe all possible side effects. Call your doctor for medical advice about side effects. You may report side effects to FDA at 1-800-FDA-1088. Where should I keep my medication? This medication is given in a hospital or clinic. It will not be stored at home. NOTE: This sheet is a summary. It may not cover all possible information. If you have questions about this medicine, talk to your doctor, pharmacist, or health care provider.  2024 Elsevier/Gold Standard (2023-07-01 00:00:00)

## 2024-01-31 ENCOUNTER — Ambulatory Visit: Attending: Cardiovascular Disease | Admitting: Cardiovascular Disease

## 2024-01-31 ENCOUNTER — Encounter: Payer: Self-pay | Admitting: Cardiovascular Disease

## 2024-01-31 VITALS — BP 120/80 | HR 59 | Ht 62.0 in | Wt 169.4 lb

## 2024-01-31 DIAGNOSIS — I10 Essential (primary) hypertension: Secondary | ICD-10-CM | POA: Diagnosis not present

## 2024-01-31 DIAGNOSIS — Z951 Presence of aortocoronary bypass graft: Secondary | ICD-10-CM | POA: Insufficient documentation

## 2024-01-31 DIAGNOSIS — I480 Paroxysmal atrial fibrillation: Secondary | ICD-10-CM | POA: Diagnosis present

## 2024-01-31 DIAGNOSIS — G4733 Obstructive sleep apnea (adult) (pediatric): Secondary | ICD-10-CM | POA: Diagnosis not present

## 2024-01-31 NOTE — Assessment & Plan Note (Addendum)
 History of PAF in the past maintaining sinus rhythm.  She was on amiodarone  which I discontinued.  The PAF was in the perioperative period at the time of her CABG.

## 2024-01-31 NOTE — Assessment & Plan Note (Signed)
 History of CAD status post non-STEMI with By myself performed 05/15/2020 revealing left main/two-vessel disease in the left dominant system.  She underwent CABG x 2 by Dr. Shyrl with a LIMA to the LAD and a vein to an obtuse marginal branch.  She did not participate in cardiac rehab.  She denies chest pain or shortness of breath.

## 2024-01-31 NOTE — Assessment & Plan Note (Signed)
History of obstructive sleep apnea not wearing CPAP

## 2024-01-31 NOTE — Assessment & Plan Note (Signed)
 History of essential hypertension her blood pressure measured today at 120/80.  She is on lisinopril  and metoprolol .

## 2024-01-31 NOTE — Progress Notes (Signed)
 01/31/2024 Donna Sloan   1940/09/14  [id number]  Primary Physician Debrah Josette ORN., PA-C Primary Cardiologist: Dorn JINNY Lesches MD FACP, [place], FAHA, FSCAI  HPI:  Donna Sloan is a 83 y.o.  moderately overweight, married, Caucasian female mother of 2, grandmother to 2 grandchildren whose husband Donna Sloan was also a patient of mine and who unfortunately passed away 2022-12-03 from cancer.  They were married for 61 years..She is still grieving his loss.  I last saw her 02/04/2023... She has a history of PAF, improved on low-dose beta blocker. Her other problems include obstructive sleep apnea; she never pursued a CPAP titration study after her initial diagnostic tests. She has hypertension and hyperlipidemia as well. She also has symptoms compatible with restless leg syndrome. Lower extremity Dopplers are normal.   She  was admitted to the hospital with a non-STEMI.  I performed diagnostic coronary angiography on her 05/15/2020 revealing left main/two-vessel disease and a left dominant system.  She underwent CABG x2 by Dr. Shyrl 05/20/2020 with a LIMA to the LAD and a vein to an obtuse marginal branch.  She has done well since.  She did not participate in cardiac rehab.   Since I saw her a year ago she has done well.  I did stop the amiodarone  last year because of PAF in the perioperative setting which no longer recurred.  She denies chest pain or shortness of breath.  She now lives alone after losing her husband Donna Sloan and appears to still be grieving.   Current Meds  Medication Sig   aspirin  EC 81 MG tablet Take 81 mg by mouth daily.   bismuth subsalicylate (PEPTO BISMOL) 262 MG/15ML suspension Take 30 mLs by mouth every 6 (six) hours as needed for indigestion.   lisinopril  (ZESTRIL ) 20 MG tablet TAKE 1 TABLET(20 MG) BY MOUTH DAILY   metoprolol  succinate (TOPROL -XL) 50 MG 24 hr tablet TAKE 1 TABLET(50 MG) BY MOUTH DAILY   omeprazole  (PRILOSEC) 40 MG capsule TAKE 1 CAPSULE BY MOUTH  EVERY DAY     Allergies  Allergen Reactions   Penicillins     Has patient had a PCN reaction causing immediate rash, facial/tongue/throat swelling, SOB or lightheadedness with hypotension: Yes Has patient had a PCN reaction causing severe rash involving mucus membranes or skin necrosis: No Has patient had a PCN reaction that required hospitalization: No Has patient had a PCN reaction occurring within the last 10 years: No If all of the above answers are NO, then may proceed with Cephalosporin use.   Rosuvastatin  Nausea And Vomiting   Simvastatin Nausea And Vomiting    Social History   Socioeconomic History   Marital status: Widowed    Spouse name: Not on file   Number of children: Not on file   Years of education: Not on file   Highest education level: Not on file  Occupational History   Not on file  Tobacco Use   Smoking status: Never   Smokeless tobacco: Never  Vaping Use   Vaping status: Never Used  Substance and Sexual Activity   Alcohol use: No    Alcohol/week: 0.0 standard drinks of alcohol   Drug use: No   Sexual activity: Not on file  Other Topics Concern   Not on file  Social History Narrative   Lives with husband.  Has 2 sons.  Retired from [employer] working.  Education: high school.     Social Drivers of Corporate investment banker Strain: Not on  file  Food Insecurity: Not on file  Transportation Needs: Not on file  Physical Activity: Not on file  Stress: Not on file  Social Connections: Not on file  Intimate Partner Violence: Not on file     Review of Systems: General: negative for chills, fever, night sweats or weight changes.  Cardiovascular: negative for chest pain, dyspnea on exertion, edema, orthopnea, palpitations, paroxysmal nocturnal dyspnea or shortness of breath Dermatological: negative for rash Respiratory: negative for cough or wheezing Urologic: negative for hematuria Abdominal: negative for nausea, vomiting, diarrhea, bright red blood  per rectum, melena, or hematemesis Neurologic: negative for visual changes, syncope, or dizziness All other systems reviewed and are otherwise negative except as noted above.    Blood pressure 120/80, pulse (!) 59, height 5' 2 (1.575 m), weight 169 lb 6.4 oz (76.8 kg), SpO2 97%.  General appearance: alert and no distress Neck: no adenopathy, no carotid bruit, no JVD, supple, symmetrical, trachea midline, and thyroid not enlarged, symmetric, no tenderness/mass/nodules Lungs: clear to auscultation bilaterally Heart: regular rate and rhythm, S1, S2 normal, no murmur, click, rub or gallop Extremities: extremities normal, atraumatic, no cyanosis or edema Pulses: 2+ and symmetric Skin: Skin color, texture, turgor normal. No rashes or lesions Neurologic: Grossly normal  EKG EKG Interpretation Date/Time:  Tuesday January 31 2024 15:24:33 EDT Ventricular Rate:  59 PR Interval:  146 QRS Duration:  84 QT Interval:  408 QTC Calculation: 403 R Axis:   79  Text Interpretation: Sinus bradycardia ST & T wave abnormality, consider inferior ischemia When compared with ECG of 04-Feb-2023 14:59, Inverted T waves have replaced nonspecific T wave abnormality in Inferior leads Confirmed by Court Carrier [phone]) on 01/31/2024 3:28:50 PM    ASSESSMENT AND PLAN:   PAF (paroxysmal atrial fibrillation) (HCC) History of PAF in the past maintaining sinus rhythm.  She was on amiodarone  which I discontinued.  The PAF was in the perioperative period at the time of her CABG.  Essential hypertension History of essential hypertension her blood pressure measured today at 120/80.  She is on lisinopril  and metoprolol .  Obstructive sleep apnea History of obstructive sleep apnea not wearing CPAP.  S/P CABG x 2 History of CAD status post non-STEMI with By myself performed 05/15/2020 revealing left main/two-vessel disease in the left dominant system.  She underwent CABG x 2 by Dr. Shyrl with a LIMA to the LAD and a  vein to an obtuse marginal branch.  She did not participate in cardiac rehab.  She denies chest pain or shortness of breath.     Carrier DOROTHA Court MD FACP,FACC,FAHA, [facility] 01/31/2024 3:39 PM

## 2024-01-31 NOTE — Patient Instructions (Signed)
 Medication Instructions:  Your physician recommends that you continue on your current medications as directed. Please refer to the Current Medication list given to you today.  *If you need a refill on your cardiac medications before your next appointment, please call your pharmacy*  Lab Work: If you have labs (blood work) drawn today and your tests are completely normal, you will receive your results only by: MyChart Message (if you have MyChart) OR A paper copy in the mail If you have any lab test that is abnormal or we need to change your treatment, we will call you to review the results.  Testing/Procedures: None ordered today.  Follow-Up: At [facility], you and your health needs are our priority.  As part of our continuing mission to provide you with exceptional heart care, our providers are all part of one team.  This team includes your primary Cardiologist (physician) and Advanced Practice Providers or APPs (Physician Assistants and Nurse Practitioners) who all work together to provide you with the care you need, when you need it.  Your next appointment:   1 year(s)  Provider:   Dorn Lesches, MD    We recommend signing up for the patient portal called MyChart.  Sign up information is provided on this After Visit Summary.  MyChart is used to connect with patients for Virtual Visits (Telemedicine).  Patients are able to view lab/test results, encounter notes, upcoming appointments, etc.  Non-urgent messages can be sent to your provider as well.   To learn more about what you can do with MyChart, go to [email or web address].

## 2024-02-01 ENCOUNTER — Ambulatory Visit (INDEPENDENT_AMBULATORY_CARE_PROVIDER_SITE_OTHER)

## 2024-02-01 VITALS — BP 144/82 | HR 63 | Temp 97.9°F | Resp 18 | Ht 62.0 in | Wt 170.2 lb

## 2024-02-01 DIAGNOSIS — I214 Non-ST elevation (NSTEMI) myocardial infarction: Secondary | ICD-10-CM

## 2024-02-01 DIAGNOSIS — I200 Unstable angina: Secondary | ICD-10-CM | POA: Diagnosis not present

## 2024-02-01 DIAGNOSIS — Z951 Presence of aortocoronary bypass graft: Secondary | ICD-10-CM

## 2024-02-01 DIAGNOSIS — E782 Mixed hyperlipidemia: Secondary | ICD-10-CM

## 2024-02-01 MED ORDER — INCLISIRAN SODIUM 284 MG/1.5ML ~~LOC~~ SOSY
284.0000 mg | PREFILLED_SYRINGE | Freq: Once | SUBCUTANEOUS | Status: AC
  Administered 2024-02-01: 284 mg via SUBCUTANEOUS
  Filled 2024-02-01: qty 1.5

## 2024-02-01 NOTE — Progress Notes (Signed)
 Diagnosis: Hyperlipidemia  Provider:  Chilton Greathouse MD  Procedure: Injection  Leqvio (inclisiran), Dose: 284 mg, Site: subcutaneous, Number of injections: 1  Injection Site(s): Left lower quad. abdomen  Post Care: Patient declined observation  Discharge: Condition: Good, Destination: Home . AVS Provided  Performed by:  Loney Hering, LPN

## 2024-03-26 ENCOUNTER — Other Ambulatory Visit: Payer: Self-pay | Admitting: Cardiovascular Disease

## 2024-04-20 LAB — COLOGUARD: COLOGUARD: NEGATIVE

## 2024-06-10 ENCOUNTER — Other Ambulatory Visit: Payer: Self-pay | Admitting: Cardiovascular Disease

## 2024-06-27 ENCOUNTER — Encounter (HOSPITAL_COMMUNITY): Payer: Self-pay | Admitting: Emergency Medicine

## 2024-06-27 ENCOUNTER — Emergency Department (HOSPITAL_COMMUNITY)

## 2024-06-27 ENCOUNTER — Other Ambulatory Visit: Payer: Self-pay

## 2024-06-27 ENCOUNTER — Encounter: Payer: Self-pay | Admitting: Cardiovascular Disease

## 2024-06-27 ENCOUNTER — Emergency Department (HOSPITAL_COMMUNITY)
Admission: EM | Admit: 2024-06-27 | Discharge: 2024-06-27 | Disposition: A | Discharge status: Home / Self Care | Attending: Emergency Medicine | Admitting: Emergency Medicine

## 2024-06-27 DIAGNOSIS — I2510 Atherosclerotic heart disease of native coronary artery without angina pectoris: Secondary | ICD-10-CM | POA: Insufficient documentation

## 2024-06-27 DIAGNOSIS — N3000 Acute cystitis without hematuria: Secondary | ICD-10-CM | POA: Diagnosis not present

## 2024-06-27 DIAGNOSIS — Z7982 Long term (current) use of aspirin: Secondary | ICD-10-CM | POA: Diagnosis not present

## 2024-06-27 DIAGNOSIS — M5442 Lumbago with sciatica, left side: Secondary | ICD-10-CM | POA: Insufficient documentation

## 2024-06-27 DIAGNOSIS — I10 Essential (primary) hypertension: Secondary | ICD-10-CM | POA: Insufficient documentation

## 2024-06-27 DIAGNOSIS — R300 Dysuria: Secondary | ICD-10-CM | POA: Diagnosis present

## 2024-06-27 DIAGNOSIS — Z79899 Other long term (current) drug therapy: Secondary | ICD-10-CM | POA: Insufficient documentation

## 2024-06-27 LAB — COMPREHENSIVE METABOLIC PANEL WITH GFR
ALT: 22 U/L (ref 0–44)
AST: 32 U/L (ref 15–41)
Albumin: 3.5 g/dL (ref 3.5–5.0)
Alkaline Phosphatase: 131 U/L — ABNORMAL HIGH (ref 38–126)
Anion gap: 11 (ref 5–15)
BUN: 15 mg/dL (ref 8–23)
CO2: 21 mmol/L — ABNORMAL LOW (ref 22–32)
Calcium: 9.4 mg/dL (ref 8.9–10.3)
Chloride: 107 mmol/L (ref 98–111)
Creatinine, Ser: 0.96 mg/dL (ref 0.44–1.00)
GFR, Estimated: 59 mL/min — ABNORMAL LOW (ref >60)
Glucose, Bld: 115 mg/dL — ABNORMAL HIGH (ref 70–99)
Potassium: 4 mmol/L (ref 3.5–5.1)
Sodium: 139 mmol/L (ref 135–145)
Total Bilirubin: 0.8 mg/dL (ref 0.0–1.2)
Total Protein: 6.6 g/dL (ref 6.5–8.1)

## 2024-06-27 LAB — URINALYSIS, ROUTINE W REFLEX MICROSCOPIC
Bilirubin Urine: NEGATIVE
Glucose, UA: NEGATIVE mg/dL
Hgb urine dipstick: NEGATIVE
Ketones, ur: NEGATIVE mg/dL
Nitrite: NEGATIVE
Protein, ur: NEGATIVE mg/dL
Specific Gravity, Urine: 1.011 (ref 1.005–1.030)
pH: 7 (ref 5.0–8.0)

## 2024-06-27 LAB — CBC WITH DIFFERENTIAL/PLATELET
Abs Immature Granulocytes: 0.02 K/uL (ref 0.00–0.07)
Basophils Absolute: 0 K/uL (ref 0.0–0.1)
Basophils Relative: 1 %
Eosinophils Absolute: 0.2 K/uL (ref 0.0–0.5)
Eosinophils Relative: 3 %
HCT: 43.3 % (ref 36.0–46.0)
Hemoglobin: 14.1 g/dL (ref 12.0–15.0)
Immature Granulocytes: 0 %
Lymphocytes Relative: 30 %
Lymphs Abs: 1.8 K/uL (ref 0.7–4.0)
MCH: 30 pg (ref 26.0–34.0)
MCHC: 32.6 g/dL (ref 30.0–36.0)
MCV: 92.1 fL (ref 80.0–100.0)
Monocytes Absolute: 0.4 K/uL (ref 0.1–1.0)
Monocytes Relative: 6 %
Neutro Abs: 3.6 K/uL (ref 1.7–7.7)
Neutrophils Relative %: 60 %
Platelets: 173 K/uL (ref 150–400)
RBC: 4.7 MIL/uL (ref 3.87–5.11)
RDW: 12.9 % (ref 11.5–15.5)
WBC: 6 K/uL (ref 4.0–10.5)
nRBC: 0 % (ref 0.0–0.2)

## 2024-06-27 MED ORDER — FENTANYL CITRATE (PF) 50 MCG/ML IJ SOSY
25.0000 ug | PREFILLED_SYRINGE | Freq: Once | INTRAMUSCULAR | Status: AC
  Administered 2024-06-27: 25 ug via INTRAVENOUS
  Filled 2024-06-27: qty 1

## 2024-06-27 MED ORDER — CEPHALEXIN 250 MG PO CAPS
500.0000 mg | ORAL_CAPSULE | Freq: Once | ORAL | Status: AC
  Administered 2024-06-27: 500 mg via ORAL
  Filled 2024-06-27: qty 2

## 2024-06-27 MED ORDER — LIDOCAINE 5 % EX PTCH
1.0000 | MEDICATED_PATCH | CUTANEOUS | Status: DC
  Administered 2024-06-27: 1 via TRANSDERMAL
  Filled 2024-06-27: qty 1

## 2024-06-27 MED ORDER — ACETAMINOPHEN 500 MG PO TABS
1000.0000 mg | ORAL_TABLET | Freq: Once | ORAL | Status: AC
  Administered 2024-06-27: 1000 mg via ORAL
  Filled 2024-06-27: qty 2

## 2024-06-27 MED ORDER — CEPHALEXIN 500 MG PO CAPS: 500.0000 mg | ORAL_CAPSULE | Freq: Two times a day (BID) | ORAL | 0 refills | Status: DC

## 2024-06-27 MED ORDER — ONDANSETRON HCL 4 MG/2ML IJ SOLN
4.0000 mg | Freq: Once | INTRAMUSCULAR | Status: AC
  Administered 2024-06-27: 4 mg via INTRAVENOUS
  Filled 2024-06-27: qty 2

## 2024-06-27 MED ORDER — OXYCODONE HCL 5 MG PO TABS: 5.0000 mg | ORAL_TABLET | ORAL | 0 refills | Status: AC | PRN

## 2024-06-27 MED ORDER — LIDOCAINE 5 % EX PTCH: 1.0000 | MEDICATED_PATCH | CUTANEOUS | 0 refills | Status: AC

## 2024-06-27 MED ORDER — CEPHALEXIN 500 MG PO CAPS: 500.0000 mg | ORAL_CAPSULE | Freq: Two times a day (BID) | ORAL | 0 refills | Status: AC

## 2024-06-27 NOTE — ED Notes (Addendum)
 Pt requested heat pack on back. Applied heat pack to lower back.

## 2024-06-27 NOTE — ED Provider Notes (Signed)
 [facility] Provider Note   CSN: [id number] Arrival date & time: 06/27/24  9150     Patient presents with: Back Pain   Donna Sloan is a 83 y.o. female with PMHx GERD, HLD, HTN, CAD who presents to ED concerned for left sided back pain which started after patient stood up from the couch last night. Patient took 2 Aleeve's around 5AM this morning without significant reduction in pain. Patient does also endorse some dysuria, but states that her symptoms today do not feel as severe as prior UTI's. Pain shoots down left leg with movement and ambulation.  Patient denies urinary retention, fecal incontinence, saddle anesthesia, lower extremity weakness, fever, immunosuppression, IVDU, spinal procedure.    Back Pain      Prior to Admission medications   Medication Sig Start Date End Date Taking? Authorizing Provider  cephALEXin  (KEFLEX ) 500 MG capsule Take 1 capsule (500 mg total) by mouth 2 (two) times daily. 06/27/24  Yes Braylinn Gulden F, PA-C  lidocaine  (LIDODERM ) 5 % Place 1 patch onto the skin daily. Remove & Discard patch within 12 hours or as directed by MD 06/27/24  Yes Hoy Nidia FALCON, PA-C  oxyCODONE  (ROXICODONE ) 5 MG immediate release tablet Take 1 tablet (5 mg total) by mouth every 4 (four) hours as needed for up to 10 doses. 06/27/24  Yes Hoy Nidia F, PA-C  aspirin  EC 81 MG tablet Take 81 mg by mouth daily.    [provider]  bismuth subsalicylate (PEPTO BISMOL) 262 MG/15ML suspension Take 30 mLs by mouth every 6 (six) hours as needed for indigestion.    [provider]  lisinopril  (ZESTRIL ) 20 MG tablet TAKE 1 TABLET(20 MG) BY MOUTH DAILY 06/12/24   Court Dorn PARAS, MD  metoprolol  succinate (TOPROL -XL) 50 MG 24 hr tablet TAKE 1 TABLET(50 MG) BY MOUTH DAILY 03/28/24   Court Dorn PARAS, MD  omeprazole  (PRILOSEC) 40 MG capsule TAKE 1 CAPSULE BY MOUTH EVERY DAY 08/26/23   Court Dorn PARAS, MD     Allergies: Penicillins, Rosuvastatin , and Simvastatin    Review of Systems  Musculoskeletal:  Positive for back pain.    Updated Vital Signs BP (!) 131/57 (BP Location: Left Arm)   Pulse 64   Temp 98.4 F (36.9 C) (Oral)   Resp 16   SpO2 96%   Physical Exam Vitals and nursing note reviewed.  Constitutional:      General: She is not in acute distress.    Appearance: She is not ill-appearing or toxic-appearing.  HENT:     Head: Normocephalic and atraumatic.     Mouth/Throat:     Mouth: Mucous membranes are moist.     Pharynx: No oropharyngeal exudate or posterior oropharyngeal erythema.  Eyes:     General: No scleral icterus.       Right eye: No discharge.        Left eye: No discharge.     Conjunctiva/sclera: Conjunctivae normal.  Cardiovascular:     Rate and Rhythm: Normal rate.     Pulses: Normal pulses.     Heart sounds: Normal heart sounds. No murmur heard. Pulmonary:     Effort: Pulmonary effort is normal. No respiratory distress.     Breath sounds: Normal breath sounds. No wheezing, rhonchi or rales.  Abdominal:     General: Abdomen is flat. Bowel sounds are normal.     Palpations: Abdomen is soft.     Tenderness: There is no abdominal tenderness.  There is no right CVA tenderness or left CVA tenderness.  Musculoskeletal:     Right lower leg: No edema.     Left lower leg: No edema.     Comments: Left lower lumbar paraspinal tenderness to palpation. No CVA tenderness. No midline tenderness, swelling, or crepitus appreciated.  Skin:    General: Skin is warm and dry.     Findings: No rash.  Neurological:     General: No focal deficit present.     Mental Status: She is alert and oriented to person, place, and time. Mental status is at baseline.     Comments: 5/5 strength in BL LE. Sensation to light touch intact. No saddle paresthesias.  Psychiatric:        Mood and Affect: Mood normal.        Behavior: Behavior normal.     (all labs ordered are listed,  but only abnormal results are displayed) Labs Reviewed  URINALYSIS, ROUTINE W REFLEX MICROSCOPIC - Abnormal; Notable for the following components:      Result Value   APPearance HAZY (*)    Leukocytes,Ua TRACE (*)    Bacteria, UA MANY (*)    All other components within normal limits  COMPREHENSIVE METABOLIC PANEL WITH GFR - Abnormal; Notable for the following components:   CO2 21 (*)    Glucose, Bld 115 (*)    Alkaline Phosphatase 131 (*)    GFR, Estimated 59 (*)    All other components within normal limits  CBC WITH DIFFERENTIAL/PLATELET    EKG: None  Radiology: CT Lumbar Spine Wo Contrast Result Date: 06/27/2024 EXAM: CT OF THE LUMBAR SPINE WITHOUT CONTRAST 06/27/2024 10:29:30 AM TECHNIQUE: CT of the lumbar spine was performed without the administration of intravenous contrast. Multiplanar reformatted images are provided for review. Automated exposure control, iterative reconstruction, and/or weight based adjustment of the mA/kV was utilized to reduce the radiation dose to as low as reasonably achievable. COMPARISON: None available. CLINICAL HISTORY: Low back pain, increased fracture risk. FINDINGS: BONES AND ALIGNMENT: Approximately 5 mm of anterolisthesis of L4 on L5 and approximately 3 mm of anterolisthesis of L3 on L4. Degenerative anterolisthesis at L4-L5. Normal vertebral body heights. No acute fracture or suspicious bone lesion. DEGENERATIVE CHANGES: L2-L3: Mild disc bulging. Bilateral facet hypertrophy, causing mild central spinal canal stenosis and bilateral lateral recess stenosis. L3-L4: Broad-based disc bulging. Bilateral facet arthrosis, causing moderate central spinal canal stenosis and moderate bilateral lateral recess and right neural foraminal stenosis. L4-L5: Diffuse disc bulging and bilateral facet arthrosis causing moderate-to-severe central spinal canal stenosis and bilateral lateral recess stenosis. There is questionable impingement of the L5 nerves in the lateral  recess bilaterally. L5-S1: Chronic degenerative disc disease with disc bulging and endplate ridging. Small bilateral facet arthrosis. Mild-to-moderate central spinal canal stenosis and moderate bilateral neuroforaminal stenosis. SOFT TISSUES: No acute abnormality. VASCULATURE: Moderate calcific atheromatous disease present within the abdominal aorta. IMPRESSION: 1. Anterolisthesis of L4 on L5 (5 mm) and L3 on L4 (3 mm). 2. L2-3: Mild disc bulging and bilateral facet hypertrophy causing mild central spinal canal stenosis and bilateral lateral recess stenosis. 3. L3-4: Broad-based disc bulging and bilateral facet arthrosis causing moderate central spinal canal stenosis and moderate bilateral lateral recess and right neural foraminal stenosis. 4. L4-5: Degenerative anterolisthesis, diffuse disc bulging, and bilateral facet arthrosis causing moderate-to-severe central spinal canal stenosis and bilateral lateral recess stenosis. Questionable impingement of the L5 nerves in the lateral recess bilaterally. 5. L5-S1: Chronic degenerative disc disease with disc bulging,  endplate ridging, and small bilateral facet arthrosis causing mild-to-moderate central spinal canal stenosis and moderate bilateral neuroforaminal stenosis. Electronically signed by: Evalene Coho MD 06/27/2024 10:50 AM EST RP Workstation: [id number]     Procedures   Medications Ordered in the ED  lidocaine  (LIDODERM ) 5 % 1 patch (1 patch Transdermal Patch Applied 06/27/24 0917)  fentaNYL  (SUBLIMAZE ) injection 25 mcg (has no administration in time range)  cephALEXin  (KEFLEX ) capsule 500 mg (has no administration in time range)  acetaminophen  (TYLENOL ) tablet 1,000 mg (1,000 mg Oral Given 06/27/24 0917)  fentaNYL  (SUBLIMAZE ) injection 25 mcg (25 mcg Intravenous Given 06/27/24 1132)  ondansetron  (ZOFRAN ) injection 4 mg (4 mg Intravenous Given 06/27/24 1133)  fentaNYL  (SUBLIMAZE ) injection 25 mcg (25 mcg Intravenous Given 06/27/24 1251)                                     Medical Decision Making Amount and/or Complexity of Data Reviewed Labs: ordered. Radiology: ordered.  Risk OTC drugs. Prescription drug management.   This patient presents to the ED for concern of back pain, this involves an extensive number of treatment options, and is a complaint that carries with it a high risk of complications and morbidity.  The differential diagnosis includes pyelonephritis, nephrolithiasis, spinal abscess, osteomyelitis, herniated disc, muscle strain, spinal fracture, meningitis, cancer, cauda equina syndrome.   Co morbidities that complicate the patient evaluation  GERD, HLD, HTN, CAD   Additional history obtained:  Dr. Debrah PCP   Problem List / ED Course / Critical interventions / Medication management  Patient presents to ED concern for lower back pain since last night.  Started when she attempted to stand up from couch. Physical exam with left lower lumbar paraspinal muscle tenderness to palpation.  Rest of physical exam reassuring.  Patient afebrile with stable vitals. I Ordered, and personally interpreted labs.  UA with many bacteria, 6-10 WBC, trace leukocytes.  CBC without leukocytosis or anemia.  CMP reassuring. I ordered imaging studies including CT lumbar spine. I independently visualized and interpreted imaging which showed bulging disks. I agree with the radiologist interpretation. Shared all results with patient.  Answered all questions.  Patient stating that the pain medications provided in the ED gave short-term relief. Patient nervous about going home d/t the pain. I offered patient oxy 5mg  for breakthrough pain at home vs SNF care as she does not meet criteria for hospital admission today. Patient prefers to go home. Will have her follow up with PCP and orthopedics. Will start patient on keflex  for her mild UTI. I doubt the UTI is related to her pain today as her vitals are stable and there is no CVA tenderness.  Patient with a hx of penicillin allergy when she was very young - patient stating that she tolerated Keflex  well in the past. Staffed with Dr. Francesca who agrees with plan. I have reviewed the patients home medicines and have made adjustments as needed The patient has been appropriately medically screened and/or stabilized in the ED. I have low suspicion for any other emergent medical condition which would require further screening, evaluation or treatment in the ED or require inpatient management. At time of discharge the patient is hemodynamically stable and in no acute distress. I have discussed work-up results and diagnosis with patient and answered all questions. Patient is agreeable with discharge plan. We discussed strict return precautions for returning to the emergency department and they verbalized understanding.  Social Determinants of Health:  geriatric      Final diagnoses:  Acute left-sided low back pain with left-sided sciatica  Acute cystitis without hematuria    ED Discharge Orders          Ordered    oxyCODONE  (ROXICODONE ) 5 MG immediate release tablet  Every 4 hours PRN       Note to Pharmacy: Low Back pain ICD 10 code: M54.50   06/27/24 1359    lidocaine  (LIDODERM ) 5 %  Every 24 hours        06/27/24 1359    cephALEXin  (KEFLEX ) 500 MG capsule  2 times daily        06/27/24 1359               Hoy Nidia FALCON, [place] 06/27/24 1405    Francesca Elsie CROME, MD 06/28/24 0745

## 2024-06-27 NOTE — Discharge Instructions (Addendum)
 As discussed, please follow-up with your primary care provider and neurosurgery.  Seek emergency care experiencing any new or worsening symptoms.

## 2024-06-27 NOTE — ED Triage Notes (Addendum)
 Pt arrived via [facility] from home c/o left lower back pain since 8pm last night, sudden onset. Pt was shopping earlier in the day. Pt describing nerve pain upon movement. Pt reports taking 2 aleve's 4 hours ago.   188/92 70HR 20RR 97% 127CBG 97.2

## 2024-06-27 NOTE — ED Notes (Signed)
 Pt transported to CT ?

## 2024-08-03 ENCOUNTER — Ambulatory Visit

## 2024-08-13 ENCOUNTER — Telehealth: Payer: Self-pay

## 2024-08-13 NOTE — Telephone Encounter (Signed)
 Auth Submission: NO AUTH NEEDED Site of care: Site of care: [facility] Payer: Medicare A/B with BCBS supplement Medication & CPT/J Code(s) submitted: Leqvio  (Inclisiran) J1306 Diagnosis Code:  Route of submission (phone, fax, portal):  Phone # Fax # Auth type: Buy/Bill PB Units/visits requested: 284mg  x 2 doses Reference number:  Approval from: 08/13/24 to 09/01/25

## 2024-09-07 ENCOUNTER — Ambulatory Visit

## 2024-09-07 VITALS — BP 159/77 | HR 67 | Temp 98.0°F | Resp 20 | Ht 62.0 in | Wt 166.0 lb

## 2024-09-07 DIAGNOSIS — E782 Mixed hyperlipidemia: Secondary | ICD-10-CM

## 2024-09-07 DIAGNOSIS — I214 Non-ST elevation (NSTEMI) myocardial infarction: Secondary | ICD-10-CM

## 2024-09-07 DIAGNOSIS — Z951 Presence of aortocoronary bypass graft: Secondary | ICD-10-CM

## 2024-09-07 DIAGNOSIS — I200 Unstable angina: Secondary | ICD-10-CM

## 2024-09-07 MED ORDER — INCLISIRAN SODIUM 284 MG/1.5ML ~~LOC~~ SOSY
284.0000 mg | PREFILLED_SYRINGE | Freq: Once | SUBCUTANEOUS | Status: AC
  Administered 2024-09-07: 284 mg via SUBCUTANEOUS
  Filled 2024-09-07: qty 1.5

## 2024-09-07 NOTE — Progress Notes (Signed)
 Diagnosis: , Hyperlipidemia  Provider:  Mannam, Praveen MD  Procedure: Injection  Leqvio  (inclisiran), Dose: 284 mg, Site: subcutaneous, Number of injections: 1  Injection Site(s): Right upper quad. abdomen  Post Care:    Discharge: Condition: Good, Destination: Home . AVS Provided  Performed by:  Joson Sapp, RN

## 2025-03-08 ENCOUNTER — Ambulatory Visit
